# Patient Record
Sex: Male | Born: 1940
Health system: Southern US, Community
[De-identification: ages and names within clinical notes are randomized; demographics above are authoritative.]

## PROBLEM LIST (undated history)

## (undated) DIAGNOSIS — I499 Cardiac arrhythmia, unspecified: Secondary | ICD-10-CM

## (undated) DIAGNOSIS — E785 Hyperlipidemia, unspecified: Secondary | ICD-10-CM

## (undated) DIAGNOSIS — D759 Disease of blood and blood-forming organs, unspecified: Secondary | ICD-10-CM

## (undated) DIAGNOSIS — I509 Heart failure, unspecified: Secondary | ICD-10-CM

## (undated) DIAGNOSIS — I2510 Atherosclerotic heart disease of native coronary artery without angina pectoris: Secondary | ICD-10-CM

## (undated) DIAGNOSIS — M1990 Unspecified osteoarthritis, unspecified site: Secondary | ICD-10-CM

## (undated) DIAGNOSIS — I480 Paroxysmal atrial fibrillation: Secondary | ICD-10-CM

## (undated) DIAGNOSIS — I10 Essential (primary) hypertension: Secondary | ICD-10-CM

## (undated) DIAGNOSIS — C4490 Unspecified malignant neoplasm of skin, unspecified: Secondary | ICD-10-CM

## (undated) DIAGNOSIS — H269 Unspecified cataract: Secondary | ICD-10-CM

## (undated) DIAGNOSIS — C61 Malignant neoplasm of prostate: Secondary | ICD-10-CM

## (undated) DIAGNOSIS — M5126 Other intervertebral disc displacement, lumbar region: Secondary | ICD-10-CM

## (undated) DIAGNOSIS — I700 Atherosclerosis of aorta: Secondary | ICD-10-CM

## (undated) DIAGNOSIS — I77810 Thoracic aortic ectasia: Secondary | ICD-10-CM

## (undated) HISTORY — DX: Other intervertebral disc displacement, lumbar region: M51.26

## (undated) HISTORY — DX: Unspecified malignant neoplasm of skin, unspecified: C44.90

## (undated) HISTORY — DX: Malignant neoplasm of prostate: C61

## (undated) HISTORY — PX: COLONOSCOPY: SHX174

## (undated) HISTORY — PX: OTHER SURGICAL HISTORY: SHX169

## (undated) HISTORY — PX: BACK SURGERY: SHX140

## (undated) HISTORY — DX: Unspecified osteoarthritis, unspecified site: M19.90

## (undated) HISTORY — DX: Hyperlipidemia, unspecified: E78.5

## (undated) HISTORY — DX: Essential (primary) hypertension: I10

## (undated) HISTORY — DX: Unspecified cataract: H26.9

## (undated) HISTORY — PX: POLYPECTOMY: SHX149

---

## 1999-04-11 ENCOUNTER — Ambulatory Visit (HOSPITAL_COMMUNITY): Admission: RE | Admit: 1999-04-11 | Discharge: 1999-04-11 | Payer: Self-pay | Admitting: Podiatry

## 1999-07-15 ENCOUNTER — Encounter (INDEPENDENT_AMBULATORY_CARE_PROVIDER_SITE_OTHER): Payer: Self-pay | Admitting: Specialist

## 1999-07-15 ENCOUNTER — Ambulatory Visit (HOSPITAL_COMMUNITY): Admission: RE | Admit: 1999-07-15 | Discharge: 1999-07-15 | Payer: Self-pay | Admitting: Gastroenterology

## 2006-08-03 ENCOUNTER — Ambulatory Visit: Payer: Self-pay | Admitting: Gastroenterology

## 2006-08-10 ENCOUNTER — Ambulatory Visit: Payer: Self-pay | Admitting: Gastroenterology

## 2009-08-16 ENCOUNTER — Encounter: Admission: RE | Admit: 2009-08-16 | Discharge: 2009-08-16 | Payer: Self-pay | Admitting: Internal Medicine

## 2009-11-17 ENCOUNTER — Ambulatory Visit: Admission: RE | Admit: 2009-11-17 | Discharge: 2010-02-15 | Payer: Self-pay | Admitting: Radiation Oncology

## 2009-12-02 ENCOUNTER — Encounter: Admission: RE | Admit: 2009-12-02 | Discharge: 2009-12-02 | Payer: Self-pay | Admitting: Urology

## 2010-01-17 ENCOUNTER — Ambulatory Visit (HOSPITAL_BASED_OUTPATIENT_CLINIC_OR_DEPARTMENT_OTHER): Admission: RE | Admit: 2010-01-17 | Discharge: 2010-01-17 | Payer: Self-pay | Admitting: Urology

## 2010-02-18 ENCOUNTER — Encounter: Admission: RE | Admit: 2010-02-18 | Discharge: 2010-02-18 | Payer: Self-pay | Admitting: Internal Medicine

## 2010-08-25 ENCOUNTER — Encounter
Admission: RE | Admit: 2010-08-25 | Discharge: 2010-08-25 | Payer: Self-pay | Source: Home / Self Care | Attending: Internal Medicine | Admitting: Internal Medicine

## 2010-11-14 LAB — COMPREHENSIVE METABOLIC PANEL
ALT: 19 U/L (ref 0–53)
Albumin: 3.8 g/dL (ref 3.5–5.2)
Alkaline Phosphatase: 58 U/L (ref 39–117)
Chloride: 104 mEq/L (ref 96–112)
Creatinine, Ser: 1.06 mg/dL (ref 0.4–1.5)
GFR calc Af Amer: >60 mL/min (ref >60)
Total Bilirubin: 0.7 mg/dL (ref 0.3–1.2)

## 2010-11-14 LAB — PROTIME-INR: INR: 0.97 (ref 0.00–1.49)

## 2010-11-14 LAB — CBC
HCT: 47.7 % (ref 39.0–52.0)
Hemoglobin: 16.2 g/dL (ref 13.0–17.0)
MCHC: 33.9 g/dL (ref 30.0–36.0)
MCV: 95.8 fL (ref 78.0–100.0)
Platelets: 159 10*3/uL (ref 150–400)
RBC: 4.98 MIL/uL (ref 4.22–5.81)

## 2010-11-14 LAB — APTT: aPTT: 30 seconds (ref 24–37)

## 2011-08-28 ENCOUNTER — Other Ambulatory Visit: Payer: Self-pay | Admitting: Internal Medicine

## 2011-08-28 DIAGNOSIS — R9389 Abnormal findings on diagnostic imaging of other specified body structures: Secondary | ICD-10-CM

## 2011-09-01 ENCOUNTER — Ambulatory Visit
Admission: RE | Admit: 2011-09-01 | Discharge: 2011-09-01 | Disposition: A | Discharge status: Home / Self Care | Payer: Medicare Other | Source: Ambulatory Visit | Attending: Internal Medicine | Admitting: Internal Medicine

## 2011-09-01 DIAGNOSIS — R9389 Abnormal findings on diagnostic imaging of other specified body structures: Secondary | ICD-10-CM

## 2011-09-01 MED ORDER — IOHEXOL 300 MG/ML  SOLN
75.0000 mL | Freq: Once | INTRAMUSCULAR | Status: AC | PRN
  Administered 2011-09-01: 75 mL via INTRAVENOUS

## 2011-09-11 ENCOUNTER — Ambulatory Visit (HOSPITAL_COMMUNITY)
Admission: RE | Admit: 2011-09-11 | Discharge: 2011-09-11 | Disposition: A | Discharge status: Home / Self Care | Payer: Medicare Other | Source: Ambulatory Visit | Attending: Internal Medicine | Admitting: Internal Medicine

## 2011-09-11 DIAGNOSIS — R079 Chest pain, unspecified: Secondary | ICD-10-CM | POA: Insufficient documentation

## 2012-11-06 ENCOUNTER — Other Ambulatory Visit: Payer: Self-pay | Admitting: Internal Medicine

## 2012-11-06 DIAGNOSIS — I712 Thoracic aortic aneurysm, without rupture, unspecified: Secondary | ICD-10-CM

## 2012-11-08 ENCOUNTER — Ambulatory Visit
Admission: RE | Admit: 2012-11-08 | Discharge: 2012-11-08 | Disposition: A | Discharge status: Home / Self Care | Payer: Medicare Other | Source: Ambulatory Visit | Attending: Internal Medicine | Admitting: Internal Medicine

## 2012-11-08 DIAGNOSIS — I712 Thoracic aortic aneurysm, without rupture, unspecified: Secondary | ICD-10-CM

## 2012-11-08 MED ORDER — IOHEXOL 300 MG/ML  SOLN
100.0000 mL | Freq: Once | INTRAMUSCULAR | Status: AC | PRN
  Administered 2012-11-08: 100 mL via INTRAVENOUS

## 2013-10-08 ENCOUNTER — Other Ambulatory Visit: Payer: Self-pay | Admitting: Internal Medicine

## 2013-10-08 DIAGNOSIS — N6320 Unspecified lump in the left breast, unspecified quadrant: Secondary | ICD-10-CM

## 2013-10-17 ENCOUNTER — Ambulatory Visit
Admission: RE | Admit: 2013-10-17 | Discharge: 2013-10-17 | Disposition: A | Discharge status: Home / Self Care | Payer: Commercial Managed Care - HMO | Source: Ambulatory Visit | Attending: Internal Medicine | Admitting: Internal Medicine

## 2013-10-17 DIAGNOSIS — N6320 Unspecified lump in the left breast, unspecified quadrant: Secondary | ICD-10-CM

## 2014-03-06 ENCOUNTER — Other Ambulatory Visit: Payer: Self-pay | Admitting: Internal Medicine

## 2014-03-06 DIAGNOSIS — I714 Abdominal aortic aneurysm, without rupture, unspecified: Secondary | ICD-10-CM

## 2014-03-12 ENCOUNTER — Other Ambulatory Visit: Payer: Self-pay | Admitting: Internal Medicine

## 2014-03-12 ENCOUNTER — Ambulatory Visit
Admission: RE | Admit: 2014-03-12 | Discharge: 2014-03-12 | Disposition: A | Discharge status: Home / Self Care | Payer: Commercial Managed Care - HMO | Source: Ambulatory Visit | Attending: Internal Medicine | Admitting: Internal Medicine

## 2014-03-12 ENCOUNTER — Encounter (INDEPENDENT_AMBULATORY_CARE_PROVIDER_SITE_OTHER): Payer: Self-pay

## 2014-03-12 DIAGNOSIS — I712 Thoracic aortic aneurysm, without rupture, unspecified: Secondary | ICD-10-CM

## 2014-03-12 DIAGNOSIS — I714 Abdominal aortic aneurysm, without rupture, unspecified: Secondary | ICD-10-CM

## 2014-03-12 MED ORDER — IOHEXOL 300 MG/ML  SOLN
75.0000 mL | Freq: Once | INTRAMUSCULAR | Status: AC | PRN
  Administered 2014-03-12: 75 mL via INTRAVENOUS

## 2014-03-20 ENCOUNTER — Other Ambulatory Visit: Payer: Self-pay | Admitting: Internal Medicine

## 2014-03-20 DIAGNOSIS — E042 Nontoxic multinodular goiter: Secondary | ICD-10-CM

## 2014-03-24 ENCOUNTER — Ambulatory Visit
Admission: RE | Admit: 2014-03-24 | Discharge: 2014-03-24 | Disposition: A | Discharge status: Home / Self Care | Payer: Commercial Managed Care - HMO | Source: Ambulatory Visit | Attending: Internal Medicine | Admitting: Internal Medicine

## 2014-03-24 DIAGNOSIS — E042 Nontoxic multinodular goiter: Secondary | ICD-10-CM

## 2014-04-07 ENCOUNTER — Other Ambulatory Visit: Payer: Self-pay | Admitting: Internal Medicine

## 2014-04-07 DIAGNOSIS — E041 Nontoxic single thyroid nodule: Secondary | ICD-10-CM

## 2014-04-15 ENCOUNTER — Other Ambulatory Visit (HOSPITAL_COMMUNITY)
Admission: RE | Admit: 2014-04-15 | Discharge: 2014-04-15 | Disposition: A | Discharge status: Home / Self Care | Payer: Medicare HMO | Source: Ambulatory Visit | Attending: Interventional Radiology | Admitting: Interventional Radiology

## 2014-04-15 ENCOUNTER — Ambulatory Visit
Admission: RE | Admit: 2014-04-15 | Discharge: 2014-04-15 | Disposition: A | Discharge status: Home / Self Care | Payer: Commercial Managed Care - HMO | Source: Ambulatory Visit | Attending: Internal Medicine | Admitting: Internal Medicine

## 2014-04-15 DIAGNOSIS — E041 Nontoxic single thyroid nodule: Secondary | ICD-10-CM | POA: Insufficient documentation

## 2015-09-01 DIAGNOSIS — L570 Actinic keratosis: Secondary | ICD-10-CM | POA: Diagnosis not present

## 2015-09-01 DIAGNOSIS — X32XXXD Exposure to sunlight, subsequent encounter: Secondary | ICD-10-CM | POA: Diagnosis not present

## 2015-09-16 ENCOUNTER — Other Ambulatory Visit: Payer: Self-pay | Admitting: Internal Medicine

## 2015-09-16 DIAGNOSIS — I719 Aortic aneurysm of unspecified site, without rupture: Secondary | ICD-10-CM

## 2015-09-20 ENCOUNTER — Ambulatory Visit
Admission: RE | Admit: 2015-09-20 | Discharge: 2015-09-20 | Disposition: A | Discharge status: Home / Self Care | Payer: PPO | Source: Ambulatory Visit | Attending: Internal Medicine | Admitting: Internal Medicine

## 2015-09-20 DIAGNOSIS — I712 Thoracic aortic aneurysm, without rupture: Secondary | ICD-10-CM | POA: Diagnosis not present

## 2015-09-20 DIAGNOSIS — I719 Aortic aneurysm of unspecified site, without rupture: Secondary | ICD-10-CM

## 2015-09-20 MED ORDER — IOPAMIDOL (ISOVUE-300) INJECTION 61%
75.0000 mL | Freq: Once | INTRAVENOUS | Status: AC | PRN
  Administered 2015-09-20: 75 mL via INTRAVENOUS

## 2015-12-03 DIAGNOSIS — H353132 Nonexudative age-related macular degeneration, bilateral, intermediate dry stage: Secondary | ICD-10-CM | POA: Diagnosis not present

## 2016-01-02 DIAGNOSIS — H353132 Nonexudative age-related macular degeneration, bilateral, intermediate dry stage: Secondary | ICD-10-CM | POA: Diagnosis not present

## 2016-03-14 DIAGNOSIS — I10 Essential (primary) hypertension: Secondary | ICD-10-CM | POA: Diagnosis not present

## 2016-04-04 DIAGNOSIS — T1490 Injury, unspecified: Secondary | ICD-10-CM | POA: Diagnosis not present

## 2016-04-04 DIAGNOSIS — L570 Actinic keratosis: Secondary | ICD-10-CM | POA: Diagnosis not present

## 2016-04-04 DIAGNOSIS — X32XXXD Exposure to sunlight, subsequent encounter: Secondary | ICD-10-CM | POA: Diagnosis not present

## 2016-05-31 DIAGNOSIS — H01022 Squamous blepharitis right lower eyelid: Secondary | ICD-10-CM | POA: Diagnosis not present

## 2016-05-31 DIAGNOSIS — H353122 Nonexudative age-related macular degeneration, left eye, intermediate dry stage: Secondary | ICD-10-CM | POA: Diagnosis not present

## 2016-05-31 DIAGNOSIS — H353111 Nonexudative age-related macular degeneration, right eye, early dry stage: Secondary | ICD-10-CM | POA: Diagnosis not present

## 2016-05-31 DIAGNOSIS — H04123 Dry eye syndrome of bilateral lacrimal glands: Secondary | ICD-10-CM | POA: Diagnosis not present

## 2016-06-07 DIAGNOSIS — Z23 Encounter for immunization: Secondary | ICD-10-CM | POA: Diagnosis not present

## 2016-08-31 DIAGNOSIS — H01022 Squamous blepharitis right lower eyelid: Secondary | ICD-10-CM | POA: Diagnosis not present

## 2016-08-31 DIAGNOSIS — H04123 Dry eye syndrome of bilateral lacrimal glands: Secondary | ICD-10-CM | POA: Diagnosis not present

## 2016-08-31 DIAGNOSIS — H01025 Squamous blepharitis left lower eyelid: Secondary | ICD-10-CM | POA: Diagnosis not present

## 2016-09-12 ENCOUNTER — Encounter: Payer: Self-pay | Admitting: Internal Medicine

## 2016-09-12 ENCOUNTER — Other Ambulatory Visit: Payer: Self-pay | Admitting: Internal Medicine

## 2016-09-12 ENCOUNTER — Other Ambulatory Visit: Payer: Self-pay | Admitting: Diagnostic Radiology

## 2016-09-12 DIAGNOSIS — I712 Thoracic aortic aneurysm, without rupture, unspecified: Secondary | ICD-10-CM

## 2016-09-12 DIAGNOSIS — Z125 Encounter for screening for malignant neoplasm of prostate: Secondary | ICD-10-CM | POA: Diagnosis not present

## 2016-09-12 DIAGNOSIS — D075 Carcinoma in situ of prostate: Secondary | ICD-10-CM | POA: Diagnosis not present

## 2016-09-12 DIAGNOSIS — I10 Essential (primary) hypertension: Secondary | ICD-10-CM | POA: Diagnosis not present

## 2016-09-12 DIAGNOSIS — K51419 Inflammatory polyps of colon with unspecified complications: Secondary | ICD-10-CM | POA: Diagnosis not present

## 2016-09-12 DIAGNOSIS — Z0000 Encounter for general adult medical examination without abnormal findings: Secondary | ICD-10-CM | POA: Diagnosis not present

## 2016-09-19 ENCOUNTER — Ambulatory Visit
Admission: RE | Admit: 2016-09-19 | Discharge: 2016-09-19 | Disposition: A | Discharge status: Home / Self Care | Payer: PPO | Source: Ambulatory Visit | Attending: Internal Medicine | Admitting: Internal Medicine

## 2016-09-19 DIAGNOSIS — I712 Thoracic aortic aneurysm, without rupture, unspecified: Secondary | ICD-10-CM

## 2016-09-19 MED ORDER — IOPAMIDOL (ISOVUE-370) INJECTION 76%
75.0000 mL | Freq: Once | INTRAVENOUS | Status: AC | PRN
  Administered 2016-09-19: 75 mL via INTRAVENOUS

## 2016-10-03 DIAGNOSIS — L821 Other seborrheic keratosis: Secondary | ICD-10-CM | POA: Diagnosis not present

## 2016-10-03 DIAGNOSIS — D225 Melanocytic nevi of trunk: Secondary | ICD-10-CM | POA: Diagnosis not present

## 2016-10-03 DIAGNOSIS — X32XXXD Exposure to sunlight, subsequent encounter: Secondary | ICD-10-CM | POA: Diagnosis not present

## 2016-10-03 DIAGNOSIS — L570 Actinic keratosis: Secondary | ICD-10-CM | POA: Diagnosis not present

## 2016-10-09 HISTORY — PX: COLONOSCOPY: SHX174

## 2016-10-23 ENCOUNTER — Ambulatory Visit (AMBULATORY_SURGERY_CENTER): Payer: Self-pay

## 2016-10-23 ENCOUNTER — Encounter: Payer: Self-pay | Admitting: Internal Medicine

## 2016-10-23 VITALS — Ht 69.0 in | Wt 217.0 lb

## 2016-10-23 DIAGNOSIS — Z86010 Personal history of colonic polyps: Secondary | ICD-10-CM

## 2016-10-23 MED ORDER — NA SULFATE-K SULFATE-MG SULF 17.5-3.13-1.6 GM/177ML PO SOLN: 1.0000 | Freq: Once | ORAL | 0 refills | Status: AC

## 2016-10-23 NOTE — Progress Notes (Signed)
Patient denies allergies to eggs or soy. Patient denies home 02. No diet pills. No past problems with anesthesia.   Declines emmi.

## 2016-11-06 ENCOUNTER — Encounter: Payer: Self-pay | Admitting: Internal Medicine

## 2016-11-06 ENCOUNTER — Ambulatory Visit (AMBULATORY_SURGERY_CENTER): Payer: PPO | Admitting: Internal Medicine

## 2016-11-06 VITALS — BP 152/74 | HR 54 | Temp 97.1°F | Resp 15 | Ht 69.0 in | Wt 217.0 lb

## 2016-11-06 DIAGNOSIS — D123 Benign neoplasm of transverse colon: Secondary | ICD-10-CM

## 2016-11-06 DIAGNOSIS — Z86010 Personal history of colonic polyps: Secondary | ICD-10-CM | POA: Diagnosis not present

## 2016-11-06 DIAGNOSIS — Z1211 Encounter for screening for malignant neoplasm of colon: Secondary | ICD-10-CM | POA: Diagnosis not present

## 2016-11-06 MED ORDER — SODIUM CHLORIDE 0.9 % IV SOLN: 500.0000 mL | INTRAVENOUS | Status: DC

## 2016-11-06 NOTE — Progress Notes (Signed)
Called to room to assist during endoscopic procedure.  Patient ID and intended procedure confirmed with present staff. Received instructions for my participation in the procedure from the performing physician.  

## 2016-11-06 NOTE — Progress Notes (Signed)
Pt's states no medical or surgical changes since previsit or office visit. 

## 2016-11-06 NOTE — Patient Instructions (Signed)
YOU HAD AN ENDOSCOPIC PROCEDURE TODAY AT [facility]:   Refer to the procedure report that was given to you for any specific questions about what was found during the examination.  If the procedure report does not answer your questions, please call your gastroenterologist to clarify.  If you requested that your care partner not be given the details of your procedure findings, then the procedure report has been included in a sealed envelope for you to review at your convenience later.  YOU SHOULD EXPECT: Some feelings of bloating in the abdomen. Passage of more gas than usual.  Walking can help get rid of the air that was put into your GI tract during the procedure and reduce the bloating. If you had a lower endoscopy (such as a colonoscopy or flexible sigmoidoscopy) you may notice spotting of blood in your stool or on the toilet paper. If you underwent a bowel prep for your procedure, you may not have a normal bowel movement for a few days.  Please Note:  You might notice some irritation and congestion in your nose or some drainage.  This is from the oxygen used during your procedure.  There is no need for concern and it should clear up in a day or so.  SYMPTOMS TO REPORT IMMEDIATELY:   Following lower endoscopy (colonoscopy or flexible sigmoidoscopy):  Excessive amounts of blood in the stool  Significant tenderness or worsening of abdominal pains  Swelling of the abdomen that is new, acute  Fever of 100F or higher   For urgent or emergent issues, a gastroenterologist can be reached at any hour by calling [phone].   DIET:  We do recommend a small meal at first, but then you may proceed to your regular diet.  Drink plenty of fluids but you should avoid alcoholic beverages for 24 hours.  ACTIVITY:  You should plan to take it easy for the rest of today and you should NOT DRIVE or use heavy machinery until tomorrow (because of the sedation medicines used during the test).     FOLLOW UP: Our staff will call the number listed on your records the next business day following your procedure to check on you and address any questions or concerns that you may have regarding the information given to you following your procedure. If we do not reach you, we will leave a message.  However, if you are feeling well and you are not experiencing any problems, there is no need to return our call.  We will assume that you have returned to your regular daily activities without incident.  If any biopsies were taken you will be contacted by phone or by letter within the next 1-3 weeks.  Please call us at [phone] if you have not heard about the biopsies in 3 weeks.  Polyps (handout given) Diverticulosis (handout given) Hemorrhoids (handout given) Await for biopsy results to determined next repeat Colonoscopy High Fiber Diet (handout given)    SIGNATURES/CONFIDENTIALITY: You and/or your care partner have signed paperwork which will be entered into your electronic medical record.  These signatures attest to the fact that that the information above on your After Visit Summary has been reviewed and is understood.  Full responsibility of the confidentiality of this discharge information lies with you and/or your care-partner.

## 2016-11-06 NOTE — Progress Notes (Signed)
TO PACU VSS Report to RN

## 2016-11-06 NOTE — Op Note (Signed)
[name]: Kevin Day Procedure Date: 11/06/2016 8:33 AM MRN: [id number] Endoscopist: Jerene Bears , MD Age: 76 Referring MD:  Date of Birth: 02/22/1941 Gender: Male Account #: [id number] Procedure:                Colonoscopy Indications:              High risk colon cancer surveillance: Personal                            history of colonic polyps, Last colonoscopy: 2007                            (no polyps) Medicines:                Monitored Anesthesia Care Procedure:                Pre-Anesthesia Assessment:                           - Prior to the procedure, a History and Physical                            was performed, and patient medications and                            allergies were reviewed. The patient's tolerance of                            previous anesthesia was also reviewed. The risks                            and benefits of the procedure and the sedation                            options and risks were discussed with the patient.                            All questions were answered, and informed consent                            was obtained. Prior Anticoagulants: The patient has                            taken no previous anticoagulant or antiplatelet                            agents. ASA Grade Assessment: II - A patient with                            mild systemic disease. After reviewing the risks                            and benefits, the patient was deemed in  satisfactory condition to undergo the procedure.                           After obtaining informed consent, the colonoscope                            was passed under direct vision. Throughout the                            procedure, the patient's blood pressure, pulse, and                            oxygen saturations were monitored continuously. The                            Colonoscope was introduced through the anus and                    advanced to the the cecum, identified by                            appendiceal orifice and ileocecal valve. The                            colonoscopy was performed without difficulty. The                            patient tolerated the procedure well. The quality                            of the bowel preparation was good. The ileocecal                            valve, appendiceal orifice, and rectum were                            photographed. Scope In: 8:46:45 AM Scope Out: 9:02:17 AM Scope Withdrawal Time: 0 hours 13 minutes 35 seconds  Total Procedure Duration: 0 hours 15 minutes 32 seconds  Findings:                 The perianal and digital rectal examinations were                            normal.                           Three sessile polyps were found in the transverse                            colon. The polyps were 4 to 5 mm in size. These                            polyps were removed with a cold snare. Resection  and retrieval were complete.                           Multiple small and large-mouthed diverticula were                            found from hepatic flexure to sigmoid colon.                           Internal hemorrhoids were found during                            retroflexion. The hemorrhoids were small. Complications:            No immediate complications. Estimated Blood Loss:     Estimated blood loss was minimal. Impression:               - Three 4 to 5 mm polyps in the transverse colon,                            removed with a cold snare. Resected and retrieved.                           - Moderate diverticulosis from hepatic flexure to                            sigmoid colon.                           - Internal hemorrhoids. Recommendation:           - Patient has a contact number available for                            emergencies. The signs and symptoms of potential                            delayed  complications were discussed with the                            patient. Return to normal activities tomorrow.                            Written discharge instructions were provided to the                            patient.                           - Resume previous diet.                           - Continue present medications.                           - Await pathology results.                           -  Repeat colonoscopy is recommended. The                            colonoscopy date will be determined after pathology                            results from today's exam become available for                            review. Jerene Bears, MD 11/06/2016 9:05:37 AM This report has been signed electronically.

## 2016-11-07 ENCOUNTER — Telehealth: Payer: Self-pay

## 2016-11-07 NOTE — Telephone Encounter (Signed)
  Follow up Call-  Call back number 11/06/2016  Post procedure Call Back phone  # [phone]  Permission to leave phone message Yes  Some recent data might be hidden     Patient questions:  Do you have a fever, pain , or abdominal swelling? No. Pain Score  0 *  Have you tolerated food without any problems? Yes.    Have you been able to return to your normal activities? Yes.    Do you have any questions about your discharge instructions: Diet   No. Medications  No. Follow up visit  No.  Do you have questions or concerns about your Care? No.  Actions: * If pain score is 4 or above: No action needed, pain <4.

## 2016-11-09 ENCOUNTER — Encounter: Payer: Self-pay | Admitting: Internal Medicine

## 2016-11-30 DIAGNOSIS — H04123 Dry eye syndrome of bilateral lacrimal glands: Secondary | ICD-10-CM | POA: Diagnosis not present

## 2016-11-30 DIAGNOSIS — H353111 Nonexudative age-related macular degeneration, right eye, early dry stage: Secondary | ICD-10-CM | POA: Diagnosis not present

## 2016-11-30 DIAGNOSIS — H353122 Nonexudative age-related macular degeneration, left eye, intermediate dry stage: Secondary | ICD-10-CM | POA: Diagnosis not present

## 2016-11-30 DIAGNOSIS — H01025 Squamous blepharitis left lower eyelid: Secondary | ICD-10-CM | POA: Diagnosis not present

## 2016-11-30 DIAGNOSIS — H01022 Squamous blepharitis right lower eyelid: Secondary | ICD-10-CM | POA: Diagnosis not present

## 2017-01-15 DIAGNOSIS — H903 Sensorineural hearing loss, bilateral: Secondary | ICD-10-CM | POA: Diagnosis not present

## 2017-01-16 DIAGNOSIS — X32XXXD Exposure to sunlight, subsequent encounter: Secondary | ICD-10-CM | POA: Diagnosis not present

## 2017-01-16 DIAGNOSIS — L570 Actinic keratosis: Secondary | ICD-10-CM | POA: Diagnosis not present

## 2017-02-05 DIAGNOSIS — G8929 Other chronic pain: Secondary | ICD-10-CM | POA: Diagnosis not present

## 2017-02-05 DIAGNOSIS — M25561 Pain in right knee: Secondary | ICD-10-CM | POA: Diagnosis not present

## 2017-02-05 DIAGNOSIS — M170 Bilateral primary osteoarthritis of knee: Secondary | ICD-10-CM | POA: Diagnosis not present

## 2017-02-05 DIAGNOSIS — M25562 Pain in left knee: Secondary | ICD-10-CM | POA: Diagnosis not present

## 2017-02-14 DIAGNOSIS — M170 Bilateral primary osteoarthritis of knee: Secondary | ICD-10-CM | POA: Diagnosis not present

## 2017-03-04 DIAGNOSIS — J069 Acute upper respiratory infection, unspecified: Secondary | ICD-10-CM | POA: Diagnosis not present

## 2017-03-21 DIAGNOSIS — Z8546 Personal history of malignant neoplasm of prostate: Secondary | ICD-10-CM | POA: Diagnosis not present

## 2017-03-21 DIAGNOSIS — I10 Essential (primary) hypertension: Secondary | ICD-10-CM | POA: Diagnosis not present

## 2017-03-21 DIAGNOSIS — K635 Polyp of colon: Secondary | ICD-10-CM | POA: Diagnosis not present

## 2017-05-23 DIAGNOSIS — C44319 Basal cell carcinoma of skin of other parts of face: Secondary | ICD-10-CM | POA: Diagnosis not present

## 2017-05-23 DIAGNOSIS — D225 Melanocytic nevi of trunk: Secondary | ICD-10-CM | POA: Diagnosis not present

## 2017-05-23 DIAGNOSIS — X32XXXD Exposure to sunlight, subsequent encounter: Secondary | ICD-10-CM | POA: Diagnosis not present

## 2017-05-23 DIAGNOSIS — L308 Other specified dermatitis: Secondary | ICD-10-CM | POA: Diagnosis not present

## 2017-05-23 DIAGNOSIS — L570 Actinic keratosis: Secondary | ICD-10-CM | POA: Diagnosis not present

## 2017-06-06 DIAGNOSIS — H353122 Nonexudative age-related macular degeneration, left eye, intermediate dry stage: Secondary | ICD-10-CM | POA: Diagnosis not present

## 2017-06-06 DIAGNOSIS — H04123 Dry eye syndrome of bilateral lacrimal glands: Secondary | ICD-10-CM | POA: Diagnosis not present

## 2017-06-06 DIAGNOSIS — H353111 Nonexudative age-related macular degeneration, right eye, early dry stage: Secondary | ICD-10-CM | POA: Diagnosis not present

## 2017-06-26 DIAGNOSIS — C44319 Basal cell carcinoma of skin of other parts of face: Secondary | ICD-10-CM | POA: Diagnosis not present

## 2017-06-26 DIAGNOSIS — C44622 Squamous cell carcinoma of skin of right upper limb, including shoulder: Secondary | ICD-10-CM | POA: Diagnosis not present

## 2017-06-26 DIAGNOSIS — L570 Actinic keratosis: Secondary | ICD-10-CM | POA: Diagnosis not present

## 2017-06-26 DIAGNOSIS — X32XXXD Exposure to sunlight, subsequent encounter: Secondary | ICD-10-CM | POA: Diagnosis not present

## 2017-07-24 DIAGNOSIS — L570 Actinic keratosis: Secondary | ICD-10-CM | POA: Diagnosis not present

## 2017-07-24 DIAGNOSIS — C44319 Basal cell carcinoma of skin of other parts of face: Secondary | ICD-10-CM | POA: Diagnosis not present

## 2017-07-24 DIAGNOSIS — Z85828 Personal history of other malignant neoplasm of skin: Secondary | ICD-10-CM | POA: Diagnosis not present

## 2017-07-24 DIAGNOSIS — Z08 Encounter for follow-up examination after completed treatment for malignant neoplasm: Secondary | ICD-10-CM | POA: Diagnosis not present

## 2017-07-24 DIAGNOSIS — L255 Unspecified contact dermatitis due to plants, except food: Secondary | ICD-10-CM | POA: Diagnosis not present

## 2017-07-24 DIAGNOSIS — X32XXXD Exposure to sunlight, subsequent encounter: Secondary | ICD-10-CM | POA: Diagnosis not present

## 2017-08-09 DIAGNOSIS — J40 Bronchitis, not specified as acute or chronic: Secondary | ICD-10-CM | POA: Diagnosis not present

## 2017-09-04 DIAGNOSIS — Z85828 Personal history of other malignant neoplasm of skin: Secondary | ICD-10-CM | POA: Diagnosis not present

## 2017-09-04 DIAGNOSIS — C44319 Basal cell carcinoma of skin of other parts of face: Secondary | ICD-10-CM | POA: Diagnosis not present

## 2017-09-04 DIAGNOSIS — L570 Actinic keratosis: Secondary | ICD-10-CM | POA: Diagnosis not present

## 2017-09-04 DIAGNOSIS — Z08 Encounter for follow-up examination after completed treatment for malignant neoplasm: Secondary | ICD-10-CM | POA: Diagnosis not present

## 2017-09-04 DIAGNOSIS — X32XXXD Exposure to sunlight, subsequent encounter: Secondary | ICD-10-CM | POA: Diagnosis not present

## 2017-09-28 ENCOUNTER — Telehealth: Payer: Self-pay | Admitting: Internal Medicine

## 2017-09-28 DIAGNOSIS — Z1211 Encounter for screening for malignant neoplasm of colon: Secondary | ICD-10-CM | POA: Diagnosis not present

## 2017-09-28 DIAGNOSIS — I719 Aortic aneurysm of unspecified site, without rupture: Secondary | ICD-10-CM | POA: Diagnosis not present

## 2017-09-28 DIAGNOSIS — C61 Malignant neoplasm of prostate: Secondary | ICD-10-CM | POA: Diagnosis not present

## 2017-09-28 DIAGNOSIS — I10 Essential (primary) hypertension: Secondary | ICD-10-CM | POA: Diagnosis not present

## 2017-09-28 DIAGNOSIS — C4490 Unspecified malignant neoplasm of skin, unspecified: Secondary | ICD-10-CM | POA: Diagnosis not present

## 2017-09-28 DIAGNOSIS — K922 Gastrointestinal hemorrhage, unspecified: Secondary | ICD-10-CM | POA: Diagnosis not present

## 2017-09-28 NOTE — Telephone Encounter (Signed)
Noted  

## 2017-09-28 NOTE — Telephone Encounter (Signed)
Ritter from Dr. Carlota Raspberry office calling in stating that patient has blood in stool and blood beyond anuscope. She states that patient is actively bleeding. Per Vaughan Basta, patient would need to be seen in ED. I informed Kerin Ransom of this and she says that Dr. Carlota Raspberry does not want to send patient to ED(doesn't want patient to have to go through that) and that he will see what hemoglobin results are first. Appointment scheduled for 10/05/17.

## 2017-10-05 ENCOUNTER — Encounter (INDEPENDENT_AMBULATORY_CARE_PROVIDER_SITE_OTHER): Payer: Self-pay

## 2017-10-05 ENCOUNTER — Other Ambulatory Visit: Payer: PPO

## 2017-10-05 ENCOUNTER — Encounter: Payer: Self-pay | Admitting: Physician Assistant

## 2017-10-05 ENCOUNTER — Ambulatory Visit: Payer: PPO | Admitting: Physician Assistant

## 2017-10-05 VITALS — BP 122/68 | HR 96 | Ht 72.0 in | Wt 215.4 lb

## 2017-10-05 DIAGNOSIS — R195 Other fecal abnormalities: Secondary | ICD-10-CM | POA: Diagnosis not present

## 2017-10-05 DIAGNOSIS — K625 Hemorrhage of anus and rectum: Secondary | ICD-10-CM | POA: Diagnosis not present

## 2017-10-05 LAB — CBC WITH DIFFERENTIAL/PLATELET
BASOS ABS: 46 {cells}/uL (ref 0–200)
BASOS PCT: 0.6 %
EOS ABS: 270 {cells}/uL (ref 15–500)
Eosinophils Relative: 3.5 %
HEMATOCRIT: 47.3 % (ref 38.5–50.0)
HEMOGLOBIN: 16.9 g/dL (ref 13.2–17.1)
LYMPHS ABS: 2218 {cells}/uL (ref 850–3900)
MCH: 32.3 pg (ref 27.0–33.0)
MCHC: 35.7 g/dL (ref 32.0–36.0)
MCV: 90.4 fL (ref 80.0–100.0)
MONOS PCT: 10.8 %
MPV: 10.5 fL (ref 7.5–12.5)
NEUTROS ABS: 4335 {cells}/uL (ref 1500–7800)
Neutrophils Relative %: 56.3 %
Platelets: 179 10*3/uL (ref 140–400)
RBC: 5.23 10*6/uL (ref 4.20–5.80)
RDW: 13.1 % (ref 11.0–15.0)
Total Lymphocyte: 28.8 %
WBC: 7.7 10*3/uL (ref 3.8–10.8)
WBCMIX: 832 {cells}/uL (ref 200–950)

## 2017-10-05 NOTE — Patient Instructions (Signed)
Your physician has requested that you go to the basement for  lab work before leaving today.   

## 2017-10-05 NOTE — Progress Notes (Addendum)
Chief Complaint: Hemoccult positive stool  HPI:    Kevin Day is a 76-year past medical history of prostate cancer and radiation, who was referred to me by Levin Erp, MD for a complaint of Hemoccult positive stool.      Patient follows with Dr. Hilarie Fredrickson and had his last colonoscopy 11/06/16 with a finding of 3 4-5 mm polyps in transverse colon, moderate diverticulosis from hepatic flexure to sigmoid colon and internal hemorrhoids.  Polyps were tubular adenomas.  Repeat was recommended in 3 years.    CBC completed 09/28/17 showed a hemoglobin of 17.3 and was otherwise normal.  I did speak with Dr. Nyoka Cowden at time of patient's appointment and he described his rectal exam.  He tells me that on digital exam he saw spots of what appeared to be clotted blood on his gloved finger, "like it was coming off of a scab", he did an anoscopy and did not see any active bleeding, he did place a Q-tip past his anoscope and still got some "spots of dried blood".    Today, the patient tells me that he has been fine.  He has not seen any bright red blood in his stool or black tarry sticky stools.  He was feeling well when he was at his annual exam but was told that he had blood in his rectum.  He tells me that Dr. Nyoka Cowden was so worried that if he could not have made him an appointment in our office soon would have sent him to the hospital.  Patient tells me he has been watching his stool even closer since time of seeing Dr. Nyoka Cowden and has noticed nothing.  Patient does use a daily baby aspirin for cardiac protection.  Patient denies any change in his bowel habits, rectal pain or other symptoms.    Patient denies fever, chills, melena, weight loss, anorexia, nausea, vomiting, heartburn, reflux or symptoms that awaken him at night.  Past Medical History:  Diagnosis Date  . Cataracts, bilateral   . Hypertension   . Prostate cancer ([place])   . Skin cancer     Past Surgical History:  Procedure Laterality Date  . cataract  Bilateral   . COLONOSCOPY    . POLYPECTOMY    . radioactive      radioactive seed implants for prostate CA.     Current Outpatient Medications  Medication Sig Dispense Refill  . aspirin EC 81 MG tablet Take 81 mg by mouth daily.    Marland Kitchen glucosamine-chondroitin 500-400 MG tablet Take 1 tablet by mouth every morning.    . Multiple Vitamins-Minerals (OCUVITE ADULT 50+ PO) Take 1 capsule by mouth daily.    . Omega-3 Fatty Acids (FISH OIL PO) Take by mouth.    . Omega-3 Fatty Acids (FISH OIL) 1000 MG CAPS Take 4 capsules by mouth every morning.    . RESTASIS MULTIDOSE 0.05 % ophthalmic emulsion INSTILL 1 DROP INTO EACH EYE Q 12 H  4  . triamterene-hydrochlorothiazide (DYAZIDE) 37.5-25 MG capsule Take 1 capsule by mouth daily.     Current Facility-Administered Medications  Medication Dose Route Frequency Provider Last Rate Last Dose  . 0.9 %  sodium chloride infusion  500 mL Intravenous Continuous Pyrtle, Lajuan Lines, MD        Allergies as of 10/05/2017  . (Not on File)    Family History  Problem Relation Age of Onset  . Breast cancer Sister   . Colon cancer Neg Hx   . Esophageal cancer Neg  Hx   . Rectal cancer Neg Hx   . Stomach cancer Neg Hx     Social History   Socioeconomic History  . Marital status: Married    Spouse name: Not on file  . Number of children: Not on file  . Years of education: Not on file  . Highest education level: Not on file  Social Needs  . Financial resource strain: Not on file  . Food insecurity - worry: Not on file  . Food insecurity - inability: Not on file  . Transportation needs - medical: Not on file  . Transportation needs - non-medical: Not on file  Occupational History  . Not on file  Tobacco Use  . Smoking status: Former Research scientist (life sciences)  . Smokeless tobacco: Never Used  Substance and Sexual Activity  . Alcohol use: No  . Drug use: No  . Sexual activity: Not on file  Other Topics Concern  . Not on file  Social History Narrative  . Not on file     Review of Systems:    Constitutional: No weight loss, fever or chills Cardiovascular: No chest pain Respiratory: No SOB  Gastrointestinal: See HPI and otherwise negative   Physical Exam:  Vital signs: BP 122/68   Pulse 96   Ht 6' (1.829 m)   Wt 215 lb 6.4 oz (97.7 kg)   BMI 29.21 kg/m   Constitutional:   Pleasant Caucasian male appears to be in NAD, Well developed, Well nourished, alert and cooperative Respiratory: Respirations even and unlabored. Lungs clear to auscultation bilaterally.   No wheezes, crackles, or rhonchi.  Cardiovascular: Normal S1, S2. No MRG. Regular rate and rhythm. No peripheral edema, cyanosis or pallor.  Gastrointestinal:  Soft, nondistended, nontender. No rebound or guarding. Normal bowel sounds. No appreciable masses or hepatomegaly. Rectal: External: 3 hemorrhoid tags; internal: No mass, no discharge; anoscopy: No signs of rectal bleeding Psychiatric: . Demonstrates good judgement and reason without abnormal affect or behaviors.  RELEVANT LABS AND IMAGING: CBC    Component Value Date/Time   WBC 6.5 01/10/2010 0940   RBC 4.98 01/10/2010 0940   HGB 16.2 01/10/2010 0940   HCT 47.7 01/10/2010 0940   PLT 159 01/10/2010 0940   MCV 95.8 01/10/2010 0940   MCHC 33.9 01/10/2010 0940   RDW 13.6 01/10/2010 0940    CMP     Component Value Date/Time   NA 138 01/10/2010 0940   K 3.9 01/10/2010 0940   CL 104 01/10/2010 0940   CO2 27 01/10/2010 0940   GLUCOSE 103 (H) 01/10/2010 0940   BUN 18 01/10/2010 0940   CREATININE 1.06 01/10/2010 0940   CALCIUM 8.9 01/10/2010 0940   PROT 7.3 01/10/2010 0940   ALBUMIN 3.8 01/10/2010 0940   AST 19 01/10/2010 0940   ALT 19 01/10/2010 0940   ALKPHOS 58 01/10/2010 0940   BILITOT 0.7 01/10/2010 0940   GFRNONAA >60 01/10/2010 0940   GFRAA  01/10/2010 0940    >60        The eGFR has been calculated using the MDRD equation. This calculation has not been validated in all clinical situations. eGFR's  persistently <60 mL/min signify possible Chronic Kidney Disease.    Assessment: 1.  Hemoccult positive testing: Per PCP 09/28/17 patient has annual exam, some flecks of dried blood on exam, no acute bleeding, history of prostate cancer and radiation, per discussion with Dr. Nyoka Cowden he is concerned regarding radiation proctitis, he suggests the patient have a sigmoidoscopy for further evaluation, no bleeding at  time of exam today  Plan: 1.  Discussed with patient that I do not see any acute bleeding at time of exam today.  His recent hemoglobin was 17.  I am not concerned regarding an acute GI bleed. 2.  Repeated CBC today. 3.  We will discuss with Dr. Hilarie Fredrickson if he feels as though patient may benefit from a flex sigmoidoscopy at this time per recommendations from Dr. Carlota Raspberry. 4.  Patient to follow in clinic per recommendations after labs above and discussion with Dr. Hilarie Fredrickson.  Ellouise Newer, PA-C [facility] 10/05/2017, 4:17 PM  Cc: Levin Erp, MD   Addendum: Reviewed and agree with management. Differential for rectal bleeding includes internal hemorrhoids, which were seen at the time of his last colonoscopy, 11 months ago. Radiation proctitis is also in the differential and can bleed intermittently.  He has a history of his prostate cancer and received external beam radiation. Flexible sigmoidoscopy could be performed to clarify bleeding source though hemoglobin being normal is very reassuring.  Hemoglobin was 16.9 when checked on 10/05/2017 Flexible sigmoidoscopy would determine whether this is internal hemorrhoids or radiation proctitis.  Treatment of radiation proctitis, sometimes involves APC ablation to destroy the abnormal blood vessels created by radiation.  Normally this is only done for persistent, visible bleeding, and when complicated by anemia.  APC ablation can be done at flexible sigmoidoscopy but requires hospital setting with full bowel preparation.  Given his lack  of overt/ongoing bleeding and lack of anemia, flexible sigmoidoscopy in the [facility] for diagnosis is reasonable and would likely be reassuring to both the patient, Dr. Nyoka Cowden and [place]. Would proceed if okay with patient Pyrtle, Lajuan Lines, MD

## 2017-10-09 ENCOUNTER — Telehealth: Payer: Self-pay

## 2017-10-09 NOTE — Telephone Encounter (Signed)
Levin Erp, [place]  Jeoffrey Massed, RN        Please call pt. Per Dr. Hilarie Fredrickson and Dr. Nyoka Cowden, they would like him to have flex sig in the [facility] for further eval if he is willing. Please read Dr. Vena Rua addendum to my note. Thank you-JLL

## 2017-10-10 NOTE — Telephone Encounter (Signed)
Dottie please see Dr Vena Rua recommendation, I need to set the pt up for Flex at [facility]  When is he available.  The pt is agreeable and I will call him back with the date and time.      Addendum: Reviewed and agree with management. Differential for rectal bleeding includes internal hemorrhoids, which were seen at the time of his last colonoscopy, 11 months ago. Radiation proctitis is also in the differential and can bleed intermittently.  He has a history of his prostate cancer and received external beam radiation. Flexible sigmoidoscopy could be performed to clarify bleeding source though hemoglobin being normal is very reassuring.  Hemoglobin was 16.9 when checked on 10/05/2017 Flexible sigmoidoscopy would determine whether this is internal hemorrhoids or radiation proctitis.  Treatment of radiation proctitis, sometimes involves APC ablation to destroy the abnormal blood vessels created by radiation.  Normally this is only done for persistent, visible bleeding, and when complicated by anemia.  APC ablation can be done at flexible sigmoidoscopy but requires hospital setting with full bowel preparation.  Given his lack of overt/ongoing bleeding and lack of anemia, flexible sigmoidoscopy in the [facility] for diagnosis is reasonable and would likely be reassuring to both the patient, Dr. Nyoka Cowden and [place]. Would proceed if okay with patient Pyrtle, Lajuan Lines, MD

## 2017-10-10 NOTE — Telephone Encounter (Signed)
The pt has been scheduled for 10/17/17 flex with pyrtle and previsit for tomorrow.  The pt has been advised

## 2017-10-11 ENCOUNTER — Other Ambulatory Visit: Payer: Self-pay

## 2017-10-11 ENCOUNTER — Ambulatory Visit (AMBULATORY_SURGERY_CENTER): Payer: Self-pay

## 2017-10-11 ENCOUNTER — Encounter: Payer: Self-pay | Admitting: Internal Medicine

## 2017-10-11 VITALS — Ht 69.0 in | Wt 216.0 lb

## 2017-10-11 DIAGNOSIS — R195 Other fecal abnormalities: Secondary | ICD-10-CM

## 2017-10-11 NOTE — Progress Notes (Signed)
Denies allergies to eggs or soy products. Denies complication of anesthesia or sedation. Denies use of weight loss medication. Denies use of O2.   Emmi instructions declined.  

## 2017-10-17 ENCOUNTER — Encounter: Payer: Self-pay | Admitting: Internal Medicine

## 2017-10-17 ENCOUNTER — Ambulatory Visit (AMBULATORY_SURGERY_CENTER): Payer: PPO | Admitting: Internal Medicine

## 2017-10-17 ENCOUNTER — Other Ambulatory Visit: Payer: Self-pay

## 2017-10-17 VITALS — BP 151/83 | HR 55 | Temp 97.8°F | Resp 19 | Ht 72.0 in | Wt 215.0 lb

## 2017-10-17 DIAGNOSIS — D125 Benign neoplasm of sigmoid colon: Secondary | ICD-10-CM

## 2017-10-17 DIAGNOSIS — I10 Essential (primary) hypertension: Secondary | ICD-10-CM | POA: Diagnosis not present

## 2017-10-17 DIAGNOSIS — R195 Other fecal abnormalities: Secondary | ICD-10-CM

## 2017-10-17 DIAGNOSIS — E669 Obesity, unspecified: Secondary | ICD-10-CM | POA: Diagnosis not present

## 2017-10-17 DIAGNOSIS — Z8673 Personal history of transient ischemic attack (TIA), and cerebral infarction without residual deficits: Secondary | ICD-10-CM | POA: Diagnosis not present

## 2017-10-17 DIAGNOSIS — D124 Benign neoplasm of descending colon: Secondary | ICD-10-CM

## 2017-10-17 MED ORDER — SODIUM CHLORIDE 0.9 % IV SOLN: 500.0000 mL | Freq: Once | INTRAVENOUS | Status: DC

## 2017-10-17 NOTE — Op Note (Signed)
[place] Patient Name: Kevin Day Procedure Date: 10/17/2017 3:15 PM MRN: [id number] Endoscopist: Jerene Bears , MD Age: 77 Referring MD:  Date of Birth: 06/13/1941 Gender: Male Account #: [id number] Procedure:                Flexible Sigmoidoscopy Indications:              abnormal DRE with at primary care visit (visible                            blood seen on examining finger) Medicines:                Monitored Anesthesia Care Procedure:                Pre-Anesthesia Assessment:                           - Prior to the procedure, a History and Physical                            was performed, and patient medications and                            allergies were reviewed. The patient's tolerance of                            previous anesthesia was also reviewed. The risks                            and benefits of the procedure and the sedation                            options and risks were discussed with the patient.                            All questions were answered, and informed consent                            was obtained. Prior Anticoagulants: The patient has                            taken no previous anticoagulant or antiplatelet                            agents. ASA Grade Assessment: III - A patient with                            severe systemic disease. After reviewing the risks                            and benefits, the patient was deemed in                            satisfactory condition to undergo the procedure.  After obtaining informed consent, the scope was                            passed under direct vision. The Colonoscope was                            introduced through the anus and advanced to the the                            descending colon. The flexible sigmoidoscopy was                            accomplished without difficulty. The patient                            tolerated the procedure well.  The quality of the                            bowel preparation was good. Scope In: Scope Out: Findings:                 The digital rectal exam was normal.                           A 4 mm polyp was found in the descending colon. The                            polyp was sessile. The polyp was removed with a                            cold snare. Resection and retrieval were complete.                           A 2 mm polyp was found in the sigmoid colon. The                            polyp was sessile. The polyp was removed with a                            cold snare. Resection and retrieval were complete.                           Multiple small and large-mouthed diverticula were                            found in the sigmoid colon.                           2 discrete areas of radiation proctitis versus                            angioectasia with typical arborization were found  in the distal rectum. These areas were not bleeding.                           Internal hemorrhoids were found during                            retroflexion. The hemorrhoids were small. Complications:            No immediate complications. Estimated Blood Loss:     Estimated blood loss was minimal. Impression:               - One 4 mm polyp in the descending colon, removed                            with a cold snare. Resected and retrieved.                           - One 2 mm polyp in the sigmoid colon, removed with                            a cold snare. Resected and retrieved.                           - Moderate diverticulosis in the sigmoid colon.                           - Mild radiation proctitis versus small                            angioectasias in the distal rectum. Very likely                            source of blood seen during recent digital rectal                            examination.                           - Small internal hemorrhoids. Recommendation:            - Patient has a contact number available for                            emergencies. The signs and symptoms of potential                            delayed complications were discussed with the                            patient. Return to normal activities tomorrow.                            Written discharge instructions were provided to the  patient.                           - Resume previous diet.                           - Await pathology result.                           - Continue present medications.                           - If visible bleeding occurs on a regular basis                            then APC ablation can be performed in the distal                            rectum.                           - Recall colonoscopy at previously                            stated/recommended interval. Jerene Bears, MD 10/17/2017 3:55:06 PM This report has been signed electronically.

## 2017-10-17 NOTE — Progress Notes (Signed)
Pt's states no medical or surgical changes since previsit or office visit. 

## 2017-10-17 NOTE — Patient Instructions (Signed)
**  Handouts given on polyps, Diverticulosis and hemorrhoids**   YOU HAD AN ENDOSCOPIC PROCEDURE TODAY: Refer to the procedure report and other information in the discharge instructions given to you for any specific questions about what was found during the examination. If this information does not answer your questions, please call [place] office at [phone] to clarify.   YOU SHOULD EXPECT: Some feelings of bloating in the abdomen. Passage of more gas than usual. Walking can help get rid of the air that was put into your GI tract during the procedure and reduce the bloating. If you had a lower endoscopy (such as a colonoscopy or flexible sigmoidoscopy) you may notice spotting of blood in your stool or on the toilet paper. Some abdominal soreness may be present for a day or two, also.  DIET: Your first meal following the procedure should be a light meal and then it is ok to progress to your normal diet. A half-sandwich or bowl of soup is an example of a good first meal. Heavy or fried foods are harder to digest and may make you feel nauseous or bloated. Drink plenty of fluids but you should avoid alcoholic beverages for 24 hours. If you had a esophageal dilation, please see attached instructions for diet.    ACTIVITY: Your care partner should take you home directly after the procedure. You should plan to take it easy, moving slowly for the rest of the day. You can resume normal activity the day after the procedure however YOU SHOULD NOT DRIVE, use power tools, machinery or perform tasks that involve climbing or major physical exertion for 24 hours (because of the sedation medicines used during the test).   SYMPTOMS TO REPORT IMMEDIATELY: A gastroenterologist can be reached at any hour. Please call [phone]  for any of the following symptoms:  Following lower endoscopy (colonoscopy, flexible sigmoidoscopy) Excessive amounts of blood in the stool  Significant tenderness, worsening of abdominal  pains  Swelling of the abdomen that is new, acute  Fever of 100 or higher  stools  FOLLOW UP:  If any biopsies were taken you will be contacted by phone or by letter within the next 1-3 weeks. Call [phone]  if you have not heard about the biopsies in 3 weeks.  Please also call with any specific questions about appointments or follow up tests.

## 2017-10-17 NOTE — Progress Notes (Signed)
Report to PACU, RN, vss, BBS= Clear.  

## 2017-10-17 NOTE — Progress Notes (Signed)
Called to room to assist during endoscopic procedure.  Patient ID and intended procedure confirmed with present staff. Received instructions for my participation in the procedure from the performing physician.  

## 2017-10-18 ENCOUNTER — Telehealth: Payer: Self-pay

## 2017-10-18 NOTE — Telephone Encounter (Signed)
  Follow up Call-  Call back number 10/17/2017 11/06/2016  Post procedure Call Back phone  # [phone]  Permission to leave phone message Yes Yes  Some recent data might be hidden     Patient questions:  Do you have a fever, pain , or abdominal swelling? No. Pain Score  0 *  Have you tolerated food without any problems? Yes.    Have you been able to return to your normal activities? Yes.    Do you have any questions about your discharge instructions: Diet   No. Medications  No. Follow up visit  No.  Do you have questions or concerns about your Care? No.  Actions: * If pain score is 4 or above: No action needed, pain <4.  Pt did report he had a small amount of bright red blood after wiping his anus this am.  I asked if he could compare the amount of bleeding to a size of coin.  Pt said it was smaller than a penny.  I explained we did removed a couple polyps, and it was not uncommon to see a small amount of blood.  I asked to pt call us back if amount of bleeding increases, clots of blood or bleeding does not stop.  Pt said he would.  maw

## 2017-10-24 ENCOUNTER — Encounter: Payer: Self-pay | Admitting: Internal Medicine

## 2018-02-20 DIAGNOSIS — L570 Actinic keratosis: Secondary | ICD-10-CM | POA: Diagnosis not present

## 2018-02-20 DIAGNOSIS — C4441 Basal cell carcinoma of skin of scalp and neck: Secondary | ICD-10-CM | POA: Diagnosis not present

## 2018-02-20 DIAGNOSIS — X32XXXD Exposure to sunlight, subsequent encounter: Secondary | ICD-10-CM | POA: Diagnosis not present

## 2018-02-20 DIAGNOSIS — Z85828 Personal history of other malignant neoplasm of skin: Secondary | ICD-10-CM | POA: Diagnosis not present

## 2018-02-20 DIAGNOSIS — Z08 Encounter for follow-up examination after completed treatment for malignant neoplasm: Secondary | ICD-10-CM | POA: Diagnosis not present

## 2018-03-04 DIAGNOSIS — L820 Inflamed seborrheic keratosis: Secondary | ICD-10-CM | POA: Diagnosis not present

## 2018-03-04 DIAGNOSIS — D1800 Hemangioma unspecified site: Secondary | ICD-10-CM | POA: Diagnosis not present

## 2018-03-04 DIAGNOSIS — D489 Neoplasm of uncertain behavior, unspecified: Secondary | ICD-10-CM | POA: Diagnosis not present

## 2018-03-04 DIAGNOSIS — L853 Xerosis cutis: Secondary | ICD-10-CM | POA: Diagnosis not present

## 2018-03-04 DIAGNOSIS — L570 Actinic keratosis: Secondary | ICD-10-CM | POA: Diagnosis not present

## 2018-03-04 DIAGNOSIS — C44519 Basal cell carcinoma of skin of other part of trunk: Secondary | ICD-10-CM | POA: Diagnosis not present

## 2018-03-04 DIAGNOSIS — L814 Other melanin hyperpigmentation: Secondary | ICD-10-CM | POA: Diagnosis not present

## 2018-03-15 DIAGNOSIS — C44519 Basal cell carcinoma of skin of other part of trunk: Secondary | ICD-10-CM | POA: Diagnosis not present

## 2018-03-20 DIAGNOSIS — Z5189 Encounter for other specified aftercare: Secondary | ICD-10-CM | POA: Diagnosis not present

## 2018-03-29 DIAGNOSIS — Z4802 Encounter for removal of sutures: Secondary | ICD-10-CM | POA: Diagnosis not present

## 2018-04-01 DIAGNOSIS — H26491 Other secondary cataract, right eye: Secondary | ICD-10-CM | POA: Diagnosis not present

## 2018-04-01 DIAGNOSIS — Z5189 Encounter for other specified aftercare: Secondary | ICD-10-CM | POA: Diagnosis not present

## 2018-04-18 DIAGNOSIS — K627 Radiation proctitis: Secondary | ICD-10-CM | POA: Diagnosis not present

## 2018-04-18 DIAGNOSIS — I719 Aortic aneurysm of unspecified site, without rupture: Secondary | ICD-10-CM | POA: Diagnosis not present

## 2018-04-18 DIAGNOSIS — R030 Elevated blood-pressure reading, without diagnosis of hypertension: Secondary | ICD-10-CM | POA: Diagnosis not present

## 2018-04-18 DIAGNOSIS — K635 Polyp of colon: Secondary | ICD-10-CM | POA: Diagnosis not present

## 2018-04-23 ENCOUNTER — Other Ambulatory Visit: Payer: Self-pay | Admitting: Internal Medicine

## 2018-04-23 DIAGNOSIS — C4441 Basal cell carcinoma of skin of scalp and neck: Secondary | ICD-10-CM | POA: Diagnosis not present

## 2018-04-23 DIAGNOSIS — L570 Actinic keratosis: Secondary | ICD-10-CM | POA: Diagnosis not present

## 2018-04-23 DIAGNOSIS — I7121 Aneurysm of the ascending aorta, without rupture: Secondary | ICD-10-CM

## 2018-04-23 DIAGNOSIS — Z85828 Personal history of other malignant neoplasm of skin: Secondary | ICD-10-CM | POA: Diagnosis not present

## 2018-04-23 DIAGNOSIS — Z08 Encounter for follow-up examination after completed treatment for malignant neoplasm: Secondary | ICD-10-CM | POA: Diagnosis not present

## 2018-04-23 DIAGNOSIS — X32XXXD Exposure to sunlight, subsequent encounter: Secondary | ICD-10-CM | POA: Diagnosis not present

## 2018-04-23 DIAGNOSIS — I712 Thoracic aortic aneurysm, without rupture: Secondary | ICD-10-CM

## 2018-05-01 ENCOUNTER — Ambulatory Visit
Admission: RE | Admit: 2018-05-01 | Discharge: 2018-05-01 | Disposition: A | Discharge status: Home / Self Care | Payer: PPO | Source: Ambulatory Visit | Attending: Internal Medicine | Admitting: Internal Medicine

## 2018-05-01 DIAGNOSIS — I712 Thoracic aortic aneurysm, without rupture: Secondary | ICD-10-CM | POA: Diagnosis not present

## 2018-05-01 DIAGNOSIS — I7121 Aneurysm of the ascending aorta, without rupture: Secondary | ICD-10-CM

## 2018-05-01 MED ORDER — IOPAMIDOL (ISOVUE-370) INJECTION 76%
75.0000 mL | Freq: Once | INTRAVENOUS | Status: AC | PRN
  Administered 2018-05-01: 75 mL via INTRAVENOUS

## 2018-05-10 DIAGNOSIS — H43391 Other vitreous opacities, right eye: Secondary | ICD-10-CM | POA: Diagnosis not present

## 2018-05-10 DIAGNOSIS — H3540 Unspecified peripheral retinal degeneration: Secondary | ICD-10-CM | POA: Diagnosis not present

## 2018-05-10 DIAGNOSIS — H02831 Dermatochalasis of right upper eyelid: Secondary | ICD-10-CM | POA: Diagnosis not present

## 2018-05-10 DIAGNOSIS — H02834 Dermatochalasis of left upper eyelid: Secondary | ICD-10-CM | POA: Diagnosis not present

## 2018-05-21 DIAGNOSIS — Z08 Encounter for follow-up examination after completed treatment for malignant neoplasm: Secondary | ICD-10-CM | POA: Diagnosis not present

## 2018-05-21 DIAGNOSIS — D040 Carcinoma in situ of skin of lip: Secondary | ICD-10-CM | POA: Diagnosis not present

## 2018-05-21 DIAGNOSIS — C44319 Basal cell carcinoma of skin of other parts of face: Secondary | ICD-10-CM | POA: Diagnosis not present

## 2018-05-21 DIAGNOSIS — L570 Actinic keratosis: Secondary | ICD-10-CM | POA: Diagnosis not present

## 2018-05-21 DIAGNOSIS — X32XXXD Exposure to sunlight, subsequent encounter: Secondary | ICD-10-CM | POA: Diagnosis not present

## 2018-05-21 DIAGNOSIS — Z85828 Personal history of other malignant neoplasm of skin: Secondary | ICD-10-CM | POA: Diagnosis not present

## 2018-06-20 DIAGNOSIS — H43391 Other vitreous opacities, right eye: Secondary | ICD-10-CM | POA: Diagnosis not present

## 2018-06-20 DIAGNOSIS — H33331 Multiple defects of retina without detachment, right eye: Secondary | ICD-10-CM | POA: Diagnosis not present

## 2018-06-20 DIAGNOSIS — H35372 Puckering of macula, left eye: Secondary | ICD-10-CM | POA: Diagnosis not present

## 2018-06-20 DIAGNOSIS — H33332 Multiple defects of retina without detachment, left eye: Secondary | ICD-10-CM | POA: Diagnosis not present

## 2018-06-25 DIAGNOSIS — I872 Venous insufficiency (chronic) (peripheral): Secondary | ICD-10-CM | POA: Diagnosis not present

## 2018-06-25 DIAGNOSIS — Z08 Encounter for follow-up examination after completed treatment for malignant neoplasm: Secondary | ICD-10-CM | POA: Diagnosis not present

## 2018-06-25 DIAGNOSIS — Z23 Encounter for immunization: Secondary | ICD-10-CM | POA: Diagnosis not present

## 2018-06-25 DIAGNOSIS — X32XXXD Exposure to sunlight, subsequent encounter: Secondary | ICD-10-CM | POA: Diagnosis not present

## 2018-06-25 DIAGNOSIS — C44319 Basal cell carcinoma of skin of other parts of face: Secondary | ICD-10-CM | POA: Diagnosis not present

## 2018-06-25 DIAGNOSIS — C4441 Basal cell carcinoma of skin of scalp and neck: Secondary | ICD-10-CM | POA: Diagnosis not present

## 2018-06-25 DIAGNOSIS — Z85828 Personal history of other malignant neoplasm of skin: Secondary | ICD-10-CM | POA: Diagnosis not present

## 2018-06-25 DIAGNOSIS — D040 Carcinoma in situ of skin of lip: Secondary | ICD-10-CM | POA: Diagnosis not present

## 2018-06-25 DIAGNOSIS — L570 Actinic keratosis: Secondary | ICD-10-CM | POA: Diagnosis not present

## 2018-07-08 DIAGNOSIS — Z23 Encounter for immunization: Secondary | ICD-10-CM | POA: Diagnosis not present

## 2018-07-30 DIAGNOSIS — L570 Actinic keratosis: Secondary | ICD-10-CM | POA: Diagnosis not present

## 2018-07-30 DIAGNOSIS — I872 Venous insufficiency (chronic) (peripheral): Secondary | ICD-10-CM | POA: Diagnosis not present

## 2018-07-30 DIAGNOSIS — C44319 Basal cell carcinoma of skin of other parts of face: Secondary | ICD-10-CM | POA: Diagnosis not present

## 2018-07-30 DIAGNOSIS — C4441 Basal cell carcinoma of skin of scalp and neck: Secondary | ICD-10-CM | POA: Diagnosis not present

## 2018-07-30 DIAGNOSIS — D040 Carcinoma in situ of skin of lip: Secondary | ICD-10-CM | POA: Diagnosis not present

## 2018-07-30 DIAGNOSIS — X32XXXD Exposure to sunlight, subsequent encounter: Secondary | ICD-10-CM | POA: Diagnosis not present

## 2018-09-10 DIAGNOSIS — Z08 Encounter for follow-up examination after completed treatment for malignant neoplasm: Secondary | ICD-10-CM | POA: Diagnosis not present

## 2018-09-10 DIAGNOSIS — X32XXXD Exposure to sunlight, subsequent encounter: Secondary | ICD-10-CM | POA: Diagnosis not present

## 2018-09-10 DIAGNOSIS — Z85828 Personal history of other malignant neoplasm of skin: Secondary | ICD-10-CM | POA: Diagnosis not present

## 2018-09-10 DIAGNOSIS — L570 Actinic keratosis: Secondary | ICD-10-CM | POA: Diagnosis not present

## 2018-09-10 DIAGNOSIS — C44311 Basal cell carcinoma of skin of nose: Secondary | ICD-10-CM | POA: Diagnosis not present

## 2018-09-10 DIAGNOSIS — D044 Carcinoma in situ of skin of scalp and neck: Secondary | ICD-10-CM | POA: Diagnosis not present

## 2018-10-21 DIAGNOSIS — D225 Melanocytic nevi of trunk: Secondary | ICD-10-CM | POA: Diagnosis not present

## 2018-10-21 DIAGNOSIS — L814 Other melanin hyperpigmentation: Secondary | ICD-10-CM | POA: Diagnosis not present

## 2018-10-21 DIAGNOSIS — D1800 Hemangioma unspecified site: Secondary | ICD-10-CM | POA: Diagnosis not present

## 2018-10-21 DIAGNOSIS — L570 Actinic keratosis: Secondary | ICD-10-CM | POA: Diagnosis not present

## 2018-10-21 DIAGNOSIS — L853 Xerosis cutis: Secondary | ICD-10-CM | POA: Diagnosis not present

## 2018-10-21 DIAGNOSIS — D485 Neoplasm of uncertain behavior of skin: Secondary | ICD-10-CM | POA: Diagnosis not present

## 2018-10-23 DIAGNOSIS — E041 Nontoxic single thyroid nodule: Secondary | ICD-10-CM | POA: Diagnosis not present

## 2018-10-23 DIAGNOSIS — I712 Thoracic aortic aneurysm, without rupture: Secondary | ICD-10-CM | POA: Diagnosis not present

## 2018-10-23 DIAGNOSIS — K639 Disease of intestine, unspecified: Secondary | ICD-10-CM | POA: Diagnosis not present

## 2018-10-23 DIAGNOSIS — I10 Essential (primary) hypertension: Secondary | ICD-10-CM | POA: Diagnosis not present

## 2018-10-23 DIAGNOSIS — Z6841 Body Mass Index (BMI) 40.0 and over, adult: Secondary | ICD-10-CM | POA: Diagnosis not present

## 2018-10-23 DIAGNOSIS — C61 Malignant neoplasm of prostate: Secondary | ICD-10-CM | POA: Diagnosis not present

## 2018-10-23 DIAGNOSIS — Z125 Encounter for screening for malignant neoplasm of prostate: Secondary | ICD-10-CM | POA: Diagnosis not present

## 2018-10-23 DIAGNOSIS — C4490 Unspecified malignant neoplasm of skin, unspecified: Secondary | ICD-10-CM | POA: Diagnosis not present

## 2019-01-13 DIAGNOSIS — Z1211 Encounter for screening for malignant neoplasm of colon: Secondary | ICD-10-CM | POA: Diagnosis not present

## 2019-01-13 DIAGNOSIS — I4892 Unspecified atrial flutter: Secondary | ICD-10-CM | POA: Diagnosis not present

## 2019-01-13 DIAGNOSIS — Z86010 Personal history of colonic polyps: Secondary | ICD-10-CM | POA: Diagnosis not present

## 2019-01-13 DIAGNOSIS — R194 Change in bowel habit: Secondary | ICD-10-CM | POA: Diagnosis not present

## 2019-01-13 DIAGNOSIS — Z800 Family history of malignant neoplasm of digestive organs: Secondary | ICD-10-CM | POA: Diagnosis not present

## 2019-01-17 DIAGNOSIS — X32XXXD Exposure to sunlight, subsequent encounter: Secondary | ICD-10-CM | POA: Diagnosis not present

## 2019-01-17 DIAGNOSIS — L570 Actinic keratosis: Secondary | ICD-10-CM | POA: Diagnosis not present

## 2019-01-17 DIAGNOSIS — Z85828 Personal history of other malignant neoplasm of skin: Secondary | ICD-10-CM | POA: Diagnosis not present

## 2019-01-17 DIAGNOSIS — Z08 Encounter for follow-up examination after completed treatment for malignant neoplasm: Secondary | ICD-10-CM | POA: Diagnosis not present

## 2019-02-15 DIAGNOSIS — Z01818 Encounter for other preprocedural examination: Secondary | ICD-10-CM | POA: Diagnosis not present

## 2019-02-17 DIAGNOSIS — I129 Hypertensive chronic kidney disease with stage 1 through stage 4 chronic kidney disease, or unspecified chronic kidney disease: Secondary | ICD-10-CM | POA: Diagnosis not present

## 2019-02-17 DIAGNOSIS — K648 Other hemorrhoids: Secondary | ICD-10-CM | POA: Diagnosis not present

## 2019-02-17 DIAGNOSIS — D120 Benign neoplasm of cecum: Secondary | ICD-10-CM | POA: Diagnosis not present

## 2019-02-17 DIAGNOSIS — N189 Chronic kidney disease, unspecified: Secondary | ICD-10-CM | POA: Diagnosis not present

## 2019-02-17 DIAGNOSIS — K219 Gastro-esophageal reflux disease without esophagitis: Secondary | ICD-10-CM | POA: Diagnosis not present

## 2019-02-17 DIAGNOSIS — D124 Benign neoplasm of descending colon: Secondary | ICD-10-CM | POA: Diagnosis not present

## 2019-02-17 DIAGNOSIS — D123 Benign neoplasm of transverse colon: Secondary | ICD-10-CM | POA: Diagnosis not present

## 2019-02-17 DIAGNOSIS — D122 Benign neoplasm of ascending colon: Secondary | ICD-10-CM | POA: Diagnosis not present

## 2019-02-17 DIAGNOSIS — I4892 Unspecified atrial flutter: Secondary | ICD-10-CM | POA: Diagnosis not present

## 2019-02-17 DIAGNOSIS — Z1211 Encounter for screening for malignant neoplasm of colon: Secondary | ICD-10-CM | POA: Diagnosis not present

## 2019-02-20 DIAGNOSIS — N401 Enlarged prostate with lower urinary tract symptoms: Secondary | ICD-10-CM | POA: Diagnosis not present

## 2019-02-20 DIAGNOSIS — I4892 Unspecified atrial flutter: Secondary | ICD-10-CM | POA: Diagnosis not present

## 2019-02-20 DIAGNOSIS — E785 Hyperlipidemia, unspecified: Secondary | ICD-10-CM | POA: Diagnosis not present

## 2019-02-20 DIAGNOSIS — I10 Essential (primary) hypertension: Secondary | ICD-10-CM | POA: Diagnosis not present

## 2019-02-20 DIAGNOSIS — R3129 Other microscopic hematuria: Secondary | ICD-10-CM | POA: Diagnosis not present

## 2019-02-20 DIAGNOSIS — Z0000 Encounter for general adult medical examination without abnormal findings: Secondary | ICD-10-CM | POA: Diagnosis not present

## 2019-02-20 DIAGNOSIS — D0359 Melanoma in situ of other part of trunk: Secondary | ICD-10-CM | POA: Diagnosis not present

## 2019-02-20 DIAGNOSIS — R351 Nocturia: Secondary | ICD-10-CM | POA: Diagnosis not present

## 2019-02-27 DIAGNOSIS — N200 Calculus of kidney: Secondary | ICD-10-CM | POA: Diagnosis not present

## 2019-02-27 DIAGNOSIS — E785 Hyperlipidemia, unspecified: Secondary | ICD-10-CM | POA: Diagnosis not present

## 2019-02-27 DIAGNOSIS — Z86010 Personal history of colonic polyps: Secondary | ICD-10-CM | POA: Diagnosis not present

## 2019-02-27 DIAGNOSIS — I4892 Unspecified atrial flutter: Secondary | ICD-10-CM | POA: Diagnosis not present

## 2019-02-27 DIAGNOSIS — I10 Essential (primary) hypertension: Secondary | ICD-10-CM | POA: Diagnosis not present

## 2019-03-03 DIAGNOSIS — N201 Calculus of ureter: Secondary | ICD-10-CM | POA: Diagnosis not present

## 2019-03-03 DIAGNOSIS — N200 Calculus of kidney: Secondary | ICD-10-CM | POA: Diagnosis not present

## 2019-03-03 DIAGNOSIS — N400 Enlarged prostate without lower urinary tract symptoms: Secondary | ICD-10-CM | POA: Diagnosis not present

## 2019-03-10 ENCOUNTER — Emergency Department (HOSPITAL_COMMUNITY)
Admission: EM | Admit: 2019-03-10 | Discharge: 2019-03-10 | Disposition: A | Discharge status: Home / Self Care | Payer: PPO | Attending: Emergency Medicine | Admitting: Emergency Medicine

## 2019-03-10 ENCOUNTER — Encounter (HOSPITAL_COMMUNITY): Payer: Self-pay

## 2019-03-10 ENCOUNTER — Emergency Department (HOSPITAL_COMMUNITY): Payer: PPO

## 2019-03-10 ENCOUNTER — Ambulatory Visit: Payer: PPO | Admitting: Cardiology

## 2019-03-10 ENCOUNTER — Other Ambulatory Visit: Payer: Self-pay

## 2019-03-10 DIAGNOSIS — Z8546 Personal history of malignant neoplasm of prostate: Secondary | ICD-10-CM | POA: Insufficient documentation

## 2019-03-10 DIAGNOSIS — R079 Chest pain, unspecified: Secondary | ICD-10-CM | POA: Insufficient documentation

## 2019-03-10 DIAGNOSIS — I10 Essential (primary) hypertension: Secondary | ICD-10-CM | POA: Diagnosis not present

## 2019-03-10 DIAGNOSIS — Z87891 Personal history of nicotine dependence: Secondary | ICD-10-CM | POA: Diagnosis not present

## 2019-03-10 DIAGNOSIS — R0789 Other chest pain: Secondary | ICD-10-CM | POA: Diagnosis present

## 2019-03-10 DIAGNOSIS — R072 Precordial pain: Secondary | ICD-10-CM | POA: Diagnosis not present

## 2019-03-10 DIAGNOSIS — Z85828 Personal history of other malignant neoplasm of skin: Secondary | ICD-10-CM | POA: Diagnosis not present

## 2019-03-10 DIAGNOSIS — R0602 Shortness of breath: Secondary | ICD-10-CM | POA: Diagnosis not present

## 2019-03-10 LAB — CBC WITH DIFFERENTIAL/PLATELET
Abs Immature Granulocytes: 0.02 10*3/uL (ref 0.00–0.07)
Basophils Absolute: 0.1 10*3/uL (ref 0.0–0.1)
Basophils Relative: 1 %
Eosinophils Absolute: 0.1 10*3/uL (ref 0.0–0.5)
Eosinophils Relative: 2 %
HCT: 49.8 % (ref 39.0–52.0)
Hemoglobin: 16.9 g/dL (ref 13.0–17.0)
Immature Granulocytes: 0 %
Lymphocytes Relative: 23 %
Lymphs Abs: 1.5 10*3/uL (ref 0.7–4.0)
MCH: 32.3 pg (ref 26.0–34.0)
MCHC: 33.9 g/dL (ref 30.0–36.0)
MCV: 95 fL (ref 80.0–100.0)
Monocytes Absolute: 0.6 10*3/uL (ref 0.1–1.0)
Monocytes Relative: 9 %
Neutro Abs: 4.3 10*3/uL (ref 1.7–7.7)
Neutrophils Relative %: 65 %
Platelets: 165 10*3/uL (ref 150–400)
RBC: 5.24 MIL/uL (ref 4.22–5.81)
RDW: 13.4 % (ref 11.5–15.5)
WBC: 6.6 10*3/uL (ref 4.0–10.5)
nRBC: 0 % (ref 0.0–0.2)

## 2019-03-10 LAB — BASIC METABOLIC PANEL
Anion gap: 11 (ref 5–15)
BUN: 23 mg/dL (ref 8–23)
CO2: 27 mmol/L (ref 22–32)
Calcium: 9.3 mg/dL (ref 8.9–10.3)
Chloride: 101 mmol/L (ref 98–111)
Creatinine, Ser: 1.16 mg/dL (ref 0.61–1.24)
GFR calc Af Amer: >60 mL/min (ref >60)
GFR calc non Af Amer: >60 mL/min (ref >60)
Glucose, Bld: 104 mg/dL — ABNORMAL HIGH (ref 70–99)
Potassium: 3.9 mmol/L (ref 3.5–5.1)
Sodium: 139 mmol/L (ref 135–145)

## 2019-03-10 LAB — TROPONIN I (HIGH SENSITIVITY)
Troponin I (High Sensitivity): 6 ng/L (ref <18)
Troponin I (High Sensitivity): 5 ng/L (ref <18)

## 2019-03-10 MED ORDER — FAMOTIDINE 20 MG PO TABS: 20.0000 mg | ORAL_TABLET | Freq: Two times a day (BID) | ORAL | 0 refills | Status: DC

## 2019-03-10 NOTE — ED Provider Notes (Signed)
[facility] EMERGENCY DEPARTMENT Provider Note   CSN: [id number] Arrival date & time: 03/10/19  1131     History   Chief Complaint No chief complaint on file.   HPI Kevin Day is a 78 y.o. male.     78 year old male with prior medical history as detailed below presents for evaluation of intermittent chest pressure.  Patient reports intermittent chest pressure and discomfort over the last 3 weeks.  He reports that his discomfort is a dull tightness.  It will come and go.  He denies any specific inciting actions.  He denies prior history of CAD or ACS.  He denies current chest discomfort.  He denies associated fever, shortness of breath, nausea, vomiting, or other acute complaint.  He was seen by his regular care provider this morning and sent to the ED for evaluation.  He reports an apparently normal stress test performed multiple years prior.  The history is provided by the patient and medical records.  Chest Pain Pain location:  Substernal area Pain quality: pressure   Pain radiates to:  Does not radiate Pain severity:  No pain Onset quality:  Gradual Duration:  3 weeks Timing:  Intermittent Progression:  Waxing and waning Chronicity:  New Relieved by:  Nothing Worsened by:  Nothing Ineffective treatments:  None tried   Past Medical History:  Diagnosis Date  . Arthritis   . Cataracts, bilateral   . Hypertension   . Prostate cancer ([place])   . Prostate cancer ([place])   . Prostate cancer ([place])   . Ruptured lumbar disc   . Skin cancer     There are no active problems to display for this patient.   Past Surgical History:  Procedure Laterality Date  . BACK SURGERY    . cataract Bilateral   . COLONOSCOPY    . POLYPECTOMY    . radioactive      radioactive seed implants for prostate CA.         Home Medications    Prior to Admission medications   Medication Sig Start Date End Date Taking? Authorizing Provider  aspirin EC 81 MG tablet  Take 81 mg by mouth daily.    [provider]  glucosamine-chondroitin 500-400 MG tablet Take 1 tablet by mouth every morning.    [provider]  Multiple Vitamins-Minerals (OCUVITE ADULT 50+ PO) Take 1 capsule by mouth daily.    [provider]  Omega-3 Fatty Acids (FISH OIL PO) Take by mouth.    [provider]  Omega-3 Fatty Acids (FISH OIL) 1000 MG CAPS Take 4 capsules by mouth every morning.    [provider]  RESTASIS MULTIDOSE 0.05 % ophthalmic emulsion INSTILL 1 DROP INTO EACH EYE Q 12 H 08/29/16   [provider]  triamterene-hydrochlorothiazide (DYAZIDE) 37.5-25 MG capsule Take 1 capsule by mouth daily.    [provider]    Family History Family History  Problem Relation Age of Onset  . Breast cancer Sister   . Colon cancer Neg Hx   . Esophageal cancer Neg Hx   . Rectal cancer Neg Hx   . Stomach cancer Neg Hx     Social History Social History   Tobacco Use  . Smoking status: Former Research scientist (life sciences)  . Smokeless tobacco: Never Used  Substance Use Topics  . Alcohol use: No  . Drug use: No     Allergies   Patient has no allergy information on record.   Review of Systems Review  of Systems  Cardiovascular: Positive for chest pain.  All other systems reviewed and are negative.    Physical Exam Updated Vital Signs BP (!) 176/85 (BP Location: Right Arm)   Pulse 60   Temp (!) 97.5 F (36.4 C) (Oral)   Resp 16   Ht 5\' 10"  (1.778 m)   Wt 92.5 kg   SpO2 98%   BMI 29.27 kg/m   Physical Exam Vitals signs and nursing note reviewed.  Constitutional:      General: He is not in acute distress.    Appearance: Normal appearance. He is well-developed.  HENT:     Head: Normocephalic and atraumatic.  Eyes:     Conjunctiva/sclera: Conjunctivae normal.     Pupils: Pupils are equal, round, and reactive to light.  Neck:     Musculoskeletal: Normal range of motion and neck supple.  Cardiovascular:     Rate and  Rhythm: Normal rate and regular rhythm.     Heart sounds: Normal heart sounds.  Pulmonary:     Effort: Pulmonary effort is normal. No respiratory distress.     Breath sounds: Normal breath sounds.  Abdominal:     General: There is no distension.     Palpations: Abdomen is soft.     Tenderness: There is no abdominal tenderness.  Musculoskeletal: Normal range of motion.        General: No deformity.  Skin:    General: Skin is warm and dry.  Neurological:     Mental Status: He is alert and oriented to person, place, and time. Mental status is at baseline.      ED Treatments / Results  Labs (all labs ordered are listed, but only abnormal results are displayed) Labs Reviewed  BASIC METABOLIC PANEL - Abnormal; Notable for the following components:      Result Value   Glucose, Bld 104 (*)    All other components within normal limits  CBC WITH DIFFERENTIAL/PLATELET  TROPONIN I (HIGH SENSITIVITY)  TROPONIN I (HIGH SENSITIVITY)    EKG EKG Interpretation  Date/Time:  Monday March 10 2019 11:41:39 EDT Ventricular Rate:  60 PR Interval:    QRS Duration: 111 QT Interval:  442 QTC Calculation: 442 R Axis:   -29 Text Interpretation:  Sinus rhythm Borderline left axis deviation Confirmed by Dene Gentry [phone]) on 03/10/2019 11:47:00 AM   Radiology Dg Chest Port 1 View  Result Date: 03/10/2019 CLINICAL DATA:  Chest tightness and shortness of breath. EXAM: PORTABLE CHEST 1 VIEW COMPARISON:  CTA chest dated May 01, 2018. Chest x-ray dated December 02, 2009. FINDINGS: The heart size and mediastinal contours are within normal limits. Both lungs are clear. The visualized skeletal structures are unremarkable. IMPRESSION: No active disease. Electronically Signed   By: Titus Dubin M.D.   On: 03/10/2019 12:19    Procedures Procedures (including critical care time)  Medications Ordered in ED Medications - No data to display   Initial Impression / Assessment and Plan / ED Course   I have reviewed the triage vital signs and the nursing notes.  Pertinent labs & imaging results that were available during my care of the patient were reviewed by me and considered in my medical decision making (see chart for details).        MDM  Screen complete  Kevin Day was evaluated in Emergency Department on 03/10/2019 for the symptoms described in the history of present illness. He was evaluated in the context of the global COVID-19 pandemic, which necessitated  consideration that the patient might be at risk for infection with the SARS-CoV-2 virus that causes COVID-19. Institutional protocols and algorithms that pertain to the evaluation of patients at risk for COVID-19 are in a state of rapid change based on information released by regulatory bodies including the CDC and federal and state organizations. These policies and algorithms were followed during the patient's care in the ED.   Patient is presenting for evaluation of reported chest pain.  Patient's EKG is without evidence of acute ischemia.  Troponin x2 is negative.  Patient seen and evaluated by cardiology and deemed appropriate for discharge.  Patient does understand need for close follow-up.  Strict return precautions given and understood.  Final Clinical Impressions(s) / ED Diagnoses   Final diagnoses:  Chest pain, unspecified type    ED Discharge Orders    None       Valarie Merino, MD 03/10/19 1521

## 2019-03-10 NOTE — ED Triage Notes (Signed)
Chest pain continuous for 3 weeks 3/10 pain scale, dr did ekg, but still referred to Ed for care

## 2019-03-10 NOTE — Discharge Instructions (Signed)
Return for any problem.  Follow-up with your regular care providers as instructed. 

## 2019-03-10 NOTE — ED Notes (Signed)
Got patient undress on the monitor did ekg shown to Dr Francia Greaves patient is resting with call bell in reach

## 2019-03-10 NOTE — Consult Note (Signed)
Cardiology Admission Consult:   Patient ID: ABRHAM MASLOWSKI MRN: [id number]; DOB: 03-Feb-1941   Admission date: 03/10/2019  Primary Care Provider: Levin Erp, MD Primary Cardiologist: New Primary Electrophysiologist:  None   Chief Complaint:  Chest pain   Patient Profile:   Kevin Day is a 78 y.o. male with a h/o HTN, borderline HLD, prostate cancer and no prior cardiac history, who is being seen for the evaluation of chest pain, at the request of Dr. Francia Greaves, Emergency Medicine.   History of Present Illness:   Mr. Sciuto has no prior cardiac history. CRFs include HTN treated w/ medication.  He also reports borderline hyperlipidemia but not on medications.  He is a former smoker but quit 20 years ago.  No family history of cardiovascular disease.  Patient is fairly active at baseline.  He walks for exercise on a daily basis, usually 1 mile around his neighborhood in the evenings without limitations.  He also mows his own lawn with a push mower.  He last did this 2 weeks ago without any difficulties.  He went on his daily walk yesterday evening without any exertional symptoms.   Patient reports to the ED today, as advised by his PCP, given a 3-week history of right-sided chest pain.  Described as chest tightness.  Nonradiating.  He denies any recent triggers.  No recent heavy lifting, straining, overhead activities or injury to that area.  Chest pain has occurred almost every day over the last 3 weeks and has been fairly constant.  Pain is not worsened by physical activity.  It is nonpleuritic.  Not reproducible or exacerbated with palpation of chest wall.  He has noticed mild worsening of symptoms after p.o. intake.  Sometimes worse after eating meals but he has particularly found that symptoms worsen after drinking orange juice.  He has had some associated belching however no relief of symptoms.  He tried Tums a few nights ago however no significant relief of symptoms.  Patient has also noticed  some mild increased dyspnea at rest however no dyspnea with exertion.  Pain at its worst has been a 2 out of 10.  Very mild.  He went to his PCP today given persistent chest pain.  At PCP office he was given 2 sublingual nitroglycerin tablets without any relief of symptoms and was thus advised to come to the ED for further evaluation.   In the ED, EKG shows NSR 60 bpm. No ST abnormalities.  BP elevated in the ED 176/85. Initial Hs troponin normal at 6 ng/L. 2nd troponin pending.  CBC and BMP unremarkable.  SCr 1.16.  Chest x-ray normal  Heart Pathway Score:     Past Medical History:  Diagnosis Date  . Arthritis   . Cataracts, bilateral   . Hypertension   . Prostate cancer ([place])   . Prostate cancer ([place])   . Prostate cancer ([place])   . Ruptured lumbar disc   . Skin cancer     Past Surgical History:  Procedure Laterality Date  . BACK SURGERY    . cataract Bilateral   . COLONOSCOPY    . POLYPECTOMY    . radioactive      radioactive seed implants for prostate CA.      Medications Prior to Admission: Prior to Admission medications   Medication Sig Start Date End Date Taking? Authorizing Provider  augmented betamethasone dipropionate (DIPROLENE-AF) 0.05 % cream Apply 1 application topically 2 (two) times daily as needed for rash. Face and groin 02/19/19  Yes [provider]  Omega-3 Fatty Acids (FISH OIL PO) Take 1 capsule by mouth daily.    Yes [provider]  triamterene-hydrochlorothiazide (DYAZIDE) 37.5-25 MG capsule Take 1 capsule by mouth daily.   Yes [provider]     Allergies:   No Known Allergies  Social History:   Social History   Socioeconomic History  . Marital status: Married    Spouse name: Not on file  . Number of children: Not on file  . Years of education: Not on file  . Highest education level: Not on file  Occupational History  . Not on file  Social Needs  . Financial resource strain: Not on file  . Food insecurity    Worry:  Not on file    Inability: Not on file  . Transportation needs    Medical: Not on file    Non-medical: Not on file  Tobacco Use  . Smoking status: Former Research scientist (life sciences)  . Smokeless tobacco: Never Used  Substance and Sexual Activity  . Alcohol use: No  . Drug use: No  . Sexual activity: Not on file  Lifestyle  . Physical activity    Days per week: Not on file    Minutes per session: Not on file  . Stress: Not on file  Relationships  . Social Herbalist on phone: Not on file    Gets together: Not on file    Attends religious service: Not on file    Active member of club or organization: Not on file    Attends meetings of clubs or organizations: Not on file    Relationship status: Not on file  . Intimate partner violence    Fear of current or ex partner: Not on file    Emotionally abused: Not on file    Physically abused: Not on file    Forced sexual activity: Not on file  Other Topics Concern  . Not on file  Social History Narrative  . Not on file    Family History:   The patient's family history includes Breast cancer in his sister. There is no history of Colon cancer, Esophageal cancer, Rectal cancer, or Stomach cancer.    ROS:  Please see the history of present illness.  All other ROS reviewed and negative.     Physical Exam/Data:   Vitals:   03/10/19 1141 03/10/19 1145 03/10/19 1200  BP: (!) 176/85 (!) 149/67 (!) 151/72  Pulse: 60 (!) 51 60  Resp: 16 14 17   Temp: (!) 97.5 F (36.4 C)    TempSrc: Oral    SpO2: 98% 98% 96%  Weight: 92.5 kg    Height: 5\' 10"  (1.778 m)     No intake or output data in the 24 hours ending 03/10/19 1506 Last 3 Weights 03/10/2019 10/17/2017 10/11/2017  Weight (lbs) 204 lb 215 lb 216 lb  Weight (kg) 92.534 kg 97.523 kg 97.977 kg     Body mass index is 29.27 kg/m.  General:  Well nourished, well developed elderly WM in no acute distress HEENT: normal Lymph: no adenopathy Neck: no JVD, lipoma noted on rt aspect on anterior  neck, just below the jawline Endocrine:  No thryomegaly Vascular: No carotid bruits; FA pulses 2+ bilaterally without bruits  Cardiac:  normal S1, S2; RRR; no murmur  Lungs:  clear to auscultation bilaterally, no wheezing, rhonchi or rales  Abd: soft, nontender, no hepatomegaly  Ext: no edema Musculoskeletal:  No deformities, BUE and BLE  strength normal and equal Skin: warm and dry  Neuro:  CNs 2-12 intact, no focal abnormalities noted Psych:  Normal affect    EKG:  The ECG that was done 03/10/19 was personally reviewed and demonstrates NSR, 90 bpm no ST abnormalities, no prior EKGs for comparison.   Relevant CV Studies: None   Laboratory Data:  High Sensitivity Troponin:   Recent Labs  Lab 03/10/19 1154  TROPONINIHS 6      Cardiac EnzymesNo results for input(s): TROPONINI in the last 168 hours. No results for input(s): TROPIPOC in the last 168 hours.  Chemistry Recent Labs  Lab 03/10/19 1154  NA 139  K 3.9  CL 101  CO2 27  GLUCOSE 104*  BUN 23  CREATININE 1.16  CALCIUM 9.3  GFRNONAA >60  GFRAA >60  ANIONGAP 11    No results for input(s): PROT, ALBUMIN, AST, ALT, ALKPHOS, BILITOT in the last 168 hours. Hematology Recent Labs  Lab 03/10/19 1154  WBC 6.6  RBC 5.24  HGB 16.9  HCT 49.8  MCV 95.0  MCH 32.3  MCHC 33.9  RDW 13.4  PLT 165   BNPNo results for input(s): BNP, PROBNP in the last 168 hours.  DDimer No results for input(s): DDIMER in the last 168 hours.   Radiology/Studies:  Dg Chest Port 1 View  Result Date: 03/10/2019 CLINICAL DATA:  Chest tightness and shortness of breath. EXAM: PORTABLE CHEST 1 VIEW COMPARISON:  CTA chest dated May 01, 2018. Chest x-ray dated December 02, 2009. FINDINGS: The heart size and mediastinal contours are within normal limits. Both lungs are clear. The visualized skeletal structures are unremarkable. IMPRESSION: No active disease. Electronically Signed   By: Titus Dubin M.D.   On: 03/10/2019 12:19    Assessment  and Plan:   Kevin Day is a 78 y.o. male with a h/o HTN, borderline HLD, prostate cancer and no prior cardiac history, presenting to the ED w/ CC of chest pain.   1. Chest Pain: mostly atypical characteristics w/ 3 week h/o persistent right sided CP, unchanged by physical activity. Symptoms exacerbated after PO intake, particuarlly worse after consuming orange juice suggesting possible GI etiology. EKG shows no ischemic abnormalties. Calculated Heart Pathway Score is 3. Initial troponin is normal at 6 ng/dL. 2nd troponin pending. If 2nd troponin is < 18 and delta trop < 5 ng/L, he can be discharged from the ED w/ plans to f/u with his PCP. If 2nd troponin > 18 or delta trop > 5, will plan to admit to obs and plan 2D echo and NST in the AM .   2. HTN: 151/72 in the ED. Continue home meds. Triamterene-hctz.    For questions or updates, please contact [place] Please consult [email or web address] for contact info under        Signed, Lyda Jester, PA-C  03/10/2019 3:06 PM

## 2019-03-11 ENCOUNTER — Other Ambulatory Visit: Payer: Self-pay | Admitting: Cardiology

## 2019-03-11 DIAGNOSIS — R079 Chest pain, unspecified: Secondary | ICD-10-CM

## 2019-03-14 ENCOUNTER — Telehealth (HOSPITAL_COMMUNITY): Payer: Self-pay | Admitting: Radiology

## 2019-03-14 NOTE — Telephone Encounter (Signed)
Patient given detailed instructions per Myocardial Perfusion Study Information Sheet for the test on 03/20/2019 at 8:00. Patient notified to arrive 15 minutes early and that it is imperative to arrive on time for appointment to keep from having the test rescheduled.  If you need to cancel or reschedule your appointment, please call the office within 24 hours of your appointment. . Patient verbalized understanding.EHK

## 2019-03-17 ENCOUNTER — Telehealth (HOSPITAL_COMMUNITY): Payer: Self-pay

## 2019-03-17 NOTE — Telephone Encounter (Signed)
Spoke with the patient, instructions given. He stated that he would be here for his test. Asked to call back with any questions. S.Noriko Macari EMTP 

## 2019-03-20 ENCOUNTER — Telehealth: Payer: Self-pay

## 2019-03-20 ENCOUNTER — Ambulatory Visit (HOSPITAL_COMMUNITY): Payer: PPO | Attending: Internal Medicine

## 2019-03-20 ENCOUNTER — Other Ambulatory Visit: Payer: Self-pay

## 2019-03-20 DIAGNOSIS — R079 Chest pain, unspecified: Secondary | ICD-10-CM | POA: Insufficient documentation

## 2019-03-20 LAB — MYOCARDIAL PERFUSION IMAGING
LV dias vol: 112 mL (ref 62–150)
LV sys vol: 56 mL
Peak HR: 96 {beats}/min
Rest HR: 60 {beats}/min
SDS: 2
SRS: 0
SSS: 2
TID: 1.07

## 2019-03-20 MED ORDER — TECHNETIUM TC 99M TETROFOSMIN IV KIT
31.9000 | PACK | Freq: Once | INTRAVENOUS | Status: AC | PRN
  Administered 2019-03-20: 31.9 via INTRAVENOUS
  Filled 2019-03-20: qty 32

## 2019-03-20 MED ORDER — REGADENOSON 0.4 MG/5ML IV SOLN
0.4000 mg | Freq: Once | INTRAVENOUS | Status: AC
  Administered 2019-03-20: 0.4 mg via INTRAVENOUS

## 2019-03-20 MED ORDER — TECHNETIUM TC 99M TETROFOSMIN IV KIT
10.9000 | PACK | Freq: Once | INTRAVENOUS | Status: AC | PRN
  Administered 2019-03-20: 10.9 via INTRAVENOUS
  Filled 2019-03-20: qty 11

## 2019-03-20 NOTE — Telephone Encounter (Signed)
    COVID-19 Pre-Screening Questions:  . In the past 7 to 10 days have you had a cough,  shortness of breath, headache, congestion, fever (100 or greater) body aches, chills, sore throat, or sudden loss of taste or sense of smell? . Have you been around anyone with known Covid 19. . Have you been around anyone who is awaiting Covid 19 test results in the past 7 to 10 days? . Have you been around anyone who has been exposed to Covid 19, or has mentioned symptoms of Covid 19 within the past 7 to 10 days?  If you have any concerns/questions about symptoms patients report during screening (either on the phone or at threshold). Contact the provider seeing the patient or DOD for further guidance.  If neither are available contact a member of the leadership team.       Patient returned my call, he answered no to all of the above questions

## 2019-03-20 NOTE — Telephone Encounter (Signed)
I called and left patient a message to return call for covid screening questions

## 2019-03-21 ENCOUNTER — Ambulatory Visit: Payer: PPO | Admitting: Cardiovascular Disease

## 2019-03-21 ENCOUNTER — Telehealth: Payer: Self-pay

## 2019-03-21 ENCOUNTER — Ambulatory Visit (HOSPITAL_COMMUNITY): Payer: PPO | Attending: Cardiology

## 2019-03-21 ENCOUNTER — Encounter: Payer: Self-pay | Admitting: Cardiovascular Disease

## 2019-03-21 VITALS — BP 128/82 | HR 70 | Ht 72.0 in | Wt 206.6 lb

## 2019-03-21 DIAGNOSIS — R079 Chest pain, unspecified: Secondary | ICD-10-CM | POA: Diagnosis not present

## 2019-03-21 DIAGNOSIS — E782 Mixed hyperlipidemia: Secondary | ICD-10-CM | POA: Diagnosis not present

## 2019-03-21 DIAGNOSIS — I77810 Thoracic aortic ectasia: Secondary | ICD-10-CM | POA: Diagnosis not present

## 2019-03-21 DIAGNOSIS — I700 Atherosclerosis of aorta: Secondary | ICD-10-CM | POA: Diagnosis not present

## 2019-03-21 DIAGNOSIS — D229 Melanocytic nevi, unspecified: Secondary | ICD-10-CM | POA: Diagnosis not present

## 2019-03-21 DIAGNOSIS — L570 Actinic keratosis: Secondary | ICD-10-CM | POA: Diagnosis not present

## 2019-03-21 DIAGNOSIS — L814 Other melanin hyperpigmentation: Secondary | ICD-10-CM | POA: Diagnosis not present

## 2019-03-21 DIAGNOSIS — L853 Xerosis cutis: Secondary | ICD-10-CM | POA: Diagnosis not present

## 2019-03-21 LAB — ECHOCARDIOGRAM COMPLETE
Height: 72 in
Weight: 3305.6 oz

## 2019-03-21 MED ORDER — ROSUVASTATIN CALCIUM 10 MG PO TABS: 10.0000 mg | ORAL_TABLET | Freq: Every day | ORAL | 3 refills | Status: DC

## 2019-03-21 NOTE — Progress Notes (Signed)
Cardiology Office Note:    Date:  03/21/2019   ID:  Kevin Day, DOB 05-12-1941, MRN [id number]  PCP:  Levin Erp, MD  Cardiologist:  No primary care provider on file.  Electrophysiologist:  None   Referring MD: Levin Erp, MD   Chief Complaint  Patient presents with   Chest Pain    History of Present Illness:    Kevin Day is a 78 y.o. male with a hx of aortic atherosclerosis and dilatation, presenting for evaluation of chest pain.  The patient is here alone today.  He was recently evaluated in the emergency department on March 10, 2019, at which time he underwent formal cardiology consultation by Dr. Radford Pax.  The patient presented with chest discomfort which has now been present for approximately 4 weeks.  He describes a mild discomfort that was initially located on the right side of his chest and now has moved into the central chest region.  The pain is intermittent and unpredictable.  It is there much of the time.  He rates it as a severity of 2/10 and describes it as a pressure-like sensation.  There is no radiation to the jaw, neck, or left arm.  There is no associated diaphoresis, lightheadedness, or syncope.  He does have mild shortness of breath with activity.  He denies orthopnea or PND.  He was started on Pepcid after his emergency department evaluation but this is not improved his symptoms.  The patient has no personal history of coronary artery disease.  He has no history of myocardial infarction or cardiac catheterization.  He has never had a diagnosis of congestive heart failure, TIA, or stroke.  He has been followed for a mildly dilated thoracic aorta which is been stable over time by serial CTA scans, last in 2019.  He had a Lexiscan Myoview stress test performed yesterday which showed no ischemia and demonstrated a normal gated LVEF of 50%.  Past Medical History:  Diagnosis Date   Arthritis    Cataracts, bilateral    Hypertension    Prostate cancer ([place])      Prostate cancer ([place])    Prostate cancer ([place])    Ruptured lumbar disc    Skin cancer     Past Surgical History:  Procedure Laterality Date   BACK SURGERY     cataract Bilateral    COLONOSCOPY     POLYPECTOMY     radioactive      radioactive seed implants for prostate CA.     Current Medications: Current Meds  Medication Sig   augmented betamethasone dipropionate (DIPROLENE-AF) 0.05 % cream Apply 1 application topically 2 (two) times daily as needed for rash. Face and groin   famotidine (PEPCID) 20 MG tablet Take 1 tablet (20 mg total) by mouth 2 (two) times daily.   Omega-3 Fatty Acids (FISH OIL PO) Take 1 capsule by mouth daily.    triamterene-hydrochlorothiazide (DYAZIDE) 37.5-25 MG capsule Take 1 capsule by mouth daily.   Current Facility-Administered Medications for the 03/21/19 encounter (Office Visit) with Sherren Mocha, MD  Medication   0.9 %  sodium chloride infusion   0.9 %  sodium chloride infusion     Allergies:   Patient has no known allergies.   Social History   Socioeconomic History   Marital status: Married    Spouse name: Not on file   Number of children: Not on file   Years of education: Not on file   Highest education level: Not on file  Occupational History   Not on file  Social Needs   Financial resource strain: Not on file   Food insecurity    Worry: Not on file    Inability: Not on file   Transportation needs    Medical: Not on file    Non-medical: Not on file  Tobacco Use   Smoking status: Former Smoker   Smokeless tobacco: Never Used  Substance and Sexual Activity   Alcohol use: No   Drug use: No   Sexual activity: Not on file  Lifestyle   Physical activity    Days per week: Not on file    Minutes per session: Not on file   Stress: Not on file  Relationships   Social connections    Talks on phone: Not on file    Gets together: Not on file    Attends religious service: Not on file    Active  member of club or organization: Not on file    Attends meetings of clubs or organizations: Not on file    Relationship status: Not on file  Other Topics Concern   Not on file  Social History Narrative   Not on file     Family History: The patient's family history includes Breast cancer in his sister. There is no history of Colon cancer, Esophageal cancer, Rectal cancer, or Stomach cancer.  ROS:   Please see the history of present illness.    All other systems reviewed and are negative.  EKGs/Labs/Other Studies Reviewed:    The following studies were reviewed today: Myoview stress test 03/20/2019: Study Highlights    Nuclear stress EF: 50%.  No T wave inversion was noted during stress.  There was no ST segment deviation noted during stress.  This is a low risk study.   No reversible ischemia. LVEF 50% with normal wall motion. This is a low risk study.    CTA Chest 05/01/2018: FINDINGS: Vascular Findings:  Re-demonstrated mild fusiform aneurysmal dilatation of the ascending thoracic aorta with measurements as follows. The thoracic aorta tapers to a normal caliber at the level of the aortic arch. No evidence of thoracic aortic dissection or periaortic stranding on this nongated examination.  Moderate amount of slightly irregular mixed calcified and noncalcified atherosclerotic plaque within the descending thoracic aorta, not resulting in a hemodynamically significant stenosis.  Bovine configuration of the aortic arch. The branch vessels of the aortic arch appear patent throughout their imaged course.  Normal heart size. Coronary artery calcifications. Calcification within the sub ependymal surface of the right ventricular apex is unchanged and potentially the sequela of prior infarction. No pericardial effusion.  Although this examination was not tailored for the evaluation the pulmonary arteries, there are no discrete filling defects within the central  pulmonary arterial tree to suggest central pulmonary embolism. Normal caliber of the main pulmonary artery.  -------------------------------------------------------------  Thoracic aortic measurements:  Sinotubular junction  39 mm as measured in greatest oblique coronal dimension.  Proximal ascending aorta  43 mm as measured in greatest oblique axial dimension at the level of the main pulmonary artery (image 52, series 4), and approximately 43 mm in greatest oblique short axis coronal diameter (coronal image 70, series 11), unchanged since the 07/2010 examination.  Aortic arch aorta  33 mm as measured in greatest oblique sagittal dimension.  Proximal descending thoracic aorta  28 mm as measured in greatest oblique axial dimension at the level of the main pulmonary artery.  Distal descending thoracic aorta  30 mm as  measured in greatest oblique axial dimension at the level of the diaphragmatic hiatus.  Review of the MIP images confirms the above findings.  -------------------------------------------------------------  Non-Vascular Findings:  Mediastinum/Lymph Nodes: No bulky mediastinal, hilar or axillary lymphadenopathy.  Lungs/Pleura: Mild apical predominant predominantly paraseptal emphysematous change. Minimal dependent subpleural ground-glass atelectasis. Grossly unchanged slightly asymmetric biapical pleuroparenchymal thickening, right greater than left, similar since at least the 08/2015 examination.  Punctate granulomas within the bilateral lower lobes (right image 100, series 6; left - images 77, 97 and 127) and left upper lobe (image 23), unchanged since at least the 08/2015 examination.  Grossly unchanged scattered bilateral punctate (sub 6 mm) nodules within the left lower (95, 107, 109 and 120, series 9), right lower (image 96) and right upper lobes (image 86) are grossly unchanged since at least the 08/2015  examination.  Re-demonstrated suspected approximately 0.7 cm suspected perifissural lymph node adjacent to the anterior aspect the right minor fissure (image 86, series 9, unchanged since at least the 08/2015 examination.  No definitive new or enlarging pulmonary nodules.  Upper abdomen: Limited early arterial phase evaluation of the upper abdomen demonstrates mild nodularity of the hepatic contour with diffuse decreased attenuation hepatic parenchyma on this postcontrast examination suggestive of hepatic steatosis. There is an ill-defined area of hyperenhancement involving the anterior segment of the right lobe of the liver measuring approximately 6 mm in diameter (image 107, series 4), which is too small to accurately characterize though appears similar to the 08/2015 examination and thus potentially representative of a flash filling hemangioma.  Musculoskeletal: No acute or aggressive osseous abnormalities. Stigmata of DISH within the lower thoracic spine. Heterogeneity of the anterior aspect of the thyroid incompletely evaluated though compatible with multinodular goiter demonstrated on remote thyroid ultrasound.  IMPRESSION: 1. Stable uncomplicated mild aneurysmal dilatation of the ascending thoracic aorta measuring 43 mm in diameter, unchanged compared to the 2011 examination. Aortic aneurysm NOS (ICD10-I71.9). 2. Coronary calcifications.  Aortic Atherosclerosis (ICD10-I70.0). 3.  Emphysema (ICD10-J43.9). 4. Scattered bilateral pulmonary nodules, several which appear calcified, appears similar since at least the 08/2015 examination. Stability for at least 2 years is indicative of benign etiology. 5. Similar findings suggestive of hepatic steatosis.  EKG:  EKG is not ordered today.  The ekg ordered 03-10-2019 demonstrates NSR, baseline wander, no abnormalities identified  Recent Labs: 03/10/2019: BUN 23; Creatinine, Ser 1.16; Hemoglobin 16.9; Platelets 165; Potassium  3.9; Sodium 139  Recent Lipid Panel No results found for: CHOL, TRIG, HDL, CHOLHDL, VLDL, LDLCALC, LDLDIRECT  Physical Exam:    VS:  BP 128/82    Pulse 70    Ht 6' (1.829 m)    Wt 206 lb 9.6 oz (93.7 kg)    SpO2 96%    BMI 28.02 kg/m     Wt Readings from Last 3 Encounters:  03/21/19 206 lb 9.6 oz (93.7 kg)  03/20/19 216 lb (98 kg)  03/10/19 204 lb (92.5 kg)     GEN:  Well nourished, well developed in no acute distress HEENT: Normal NECK: No JVD; No carotid bruits LYMPHATICS: No lymphadenopathy CARDIAC: RRR, no murmurs, rubs, gallops RESPIRATORY:  Clear to auscultation without rales, wheezing or rhonchi  ABDOMEN: Soft, non-tender, non-distended MUSCULOSKELETAL:  No edema; No deformity  SKIN: Warm and dry NEUROLOGIC:  Alert and oriented x 3 PSYCHIATRIC:  Normal affect   ASSESSMENT:    1. Chest pain, unspecified type   2. Hyperlipidemia, mixed   3. Aortic atherosclerosis ([place])   4. Dilated aortic root ([place])  PLAN:    In order of problems listed above:  1. The patient's nuclear scan is reassuring for absence of ischemia.  LV function is normal.  His high-sensitivity troponin from the emergency department was negative x2.  His EKG at the time of evaluation was also negative for any acute change.  I feel comfortable with his evaluation and I discussed potential causes of noncardiac chest pain.  I do think an echocardiogram should be performed to rule out any pericardial process or other noncoronary cause of chest pain.  Otherwise I would not anticipate any further cardiac evaluation at this time. 2. I reviewed the patient's lipids and his LDL cholesterol is greater than 160 mg/dL.  Considering the presence of incidental coronary calcification and aortic atherosclerosis seen on previous CT scans, I have recommended adding statin therapy.  The patient is willing to try rosuvastatin 10 mg daily with repeat lipids and LFTs in 8 to 12 weeks.  We reviewed the pros and cons of statin  therapy as well as potential side effects. 3. As above, will treat with low-dose aspirin 81 mg and addition of a statin drug. 4. Stable findings noted over many years of CT surveillance.    Medication Adjustments/Labs and Tests Ordered: Current medicines are reviewed at length with the patient today.  Concerns regarding medicines are outlined above.  Orders Placed This Encounter  Procedures   Lipid panel   Hepatic function panel   ECHOCARDIOGRAM COMPLETE   Meds ordered this encounter  Medications   rosuvastatin (CRESTOR) 10 MG tablet    Sig: Take 1 tablet (10 mg total) by mouth daily.    Dispense:  90 tablet    Refill:  3    Patient Instructions  Medication Instructions:  1) START CRESTOR 10 mg daily  Labwork: Your provider recommends that you return for lab work in 3 months. You may come ANY TIME between 7:30AM and 4:30PM on your scheduled day as long as you are fasting.  Testing/Procedures: Your provider has requested that you have an echocardiogram. Echocardiography is a painless test that uses sound waves to create images of your heart. It provides your doctor with information about the size and shape of your heart and how well your hearts chambers and valves are working. This procedure takes approximately one hour. There are no restrictions for this procedure.  Follow-Up: Your provider wants you to follow-up in: 1 year with Dr. Burt Knack. You will receive a reminder letter in the mail two months in advance. If you don't receive a letter, please call our office to schedule the follow-up appointment.    Any Other Special Instructions Will Be Listed Below (If Applicable).     If you need a refill on your cardiac medications before your next appointment, please call your pharmacy.      Signed, Sherren Mocha, MD  03/21/2019 12:31 PM    [place]

## 2019-03-21 NOTE — Telephone Encounter (Signed)
-----   Message from [place], [place] sent at 03/21/2019  3:29 PM EDT ----- Let pt known that his stress test is normal. No signs of poor blood flow to heart tissue. No signs of CAD. Pump function of heart is normal.

## 2019-03-21 NOTE — Patient Instructions (Signed)
Medication Instructions:  1) START CRESTOR 10 mg daily  Labwork: Your provider recommends that you return for lab work in 3 months. You may come ANY TIME between 7:30AM and 4:30PM on your scheduled day as long as you are fasting.  Testing/Procedures: Your provider has requested that you have an echocardiogram. Echocardiography is a painless test that uses sound waves to create images of your heart. It provides your doctor with information about the size and shape of your heart and how well your heart's chambers and valves are working. This procedure takes approximately one hour. There are no restrictions for this procedure.  Follow-Up: Your provider wants you to follow-up in: 1 year with Dr. Burt Knack. You will receive a reminder letter in the mail two months in advance. If you don't receive a letter, please call our office to schedule the follow-up appointment.    Any Other Special Instructions Will Be Listed Below (If Applicable).     If you need a refill on your cardiac medications before your next appointment, please call your pharmacy.

## 2019-03-21 NOTE — Telephone Encounter (Signed)
Notes recorded by Frederik Schmidt, RN on 03/21/2019 at 4:01 PM EDT  The patient has been notified of the result and verbalized understanding. All questions (if any) were answered.  Frederik Schmidt, RN 03/21/2019 4:01 PM

## 2019-03-25 ENCOUNTER — Encounter (HOSPITAL_COMMUNITY): Payer: PPO

## 2019-03-28 ENCOUNTER — Ambulatory Visit: Payer: PPO | Admitting: Cardiology

## 2019-04-02 DIAGNOSIS — K219 Gastro-esophageal reflux disease without esophagitis: Secondary | ICD-10-CM | POA: Diagnosis not present

## 2019-04-02 DIAGNOSIS — R079 Chest pain, unspecified: Secondary | ICD-10-CM | POA: Diagnosis not present

## 2019-04-25 DIAGNOSIS — H04123 Dry eye syndrome of bilateral lacrimal glands: Secondary | ICD-10-CM | POA: Diagnosis not present

## 2019-04-25 DIAGNOSIS — H43393 Other vitreous opacities, bilateral: Secondary | ICD-10-CM | POA: Diagnosis not present

## 2019-04-25 DIAGNOSIS — H02834 Dermatochalasis of left upper eyelid: Secondary | ICD-10-CM | POA: Diagnosis not present

## 2019-04-25 DIAGNOSIS — H33323 Round hole, bilateral: Secondary | ICD-10-CM | POA: Diagnosis not present

## 2019-04-25 DIAGNOSIS — H02831 Dermatochalasis of right upper eyelid: Secondary | ICD-10-CM | POA: Diagnosis not present

## 2019-05-29 ENCOUNTER — Other Ambulatory Visit: Payer: PPO

## 2019-05-30 ENCOUNTER — Other Ambulatory Visit: Payer: Self-pay

## 2019-05-30 ENCOUNTER — Other Ambulatory Visit: Payer: PPO | Admitting: *Deleted

## 2019-05-30 DIAGNOSIS — E782 Mixed hyperlipidemia: Secondary | ICD-10-CM | POA: Diagnosis not present

## 2019-05-30 DIAGNOSIS — Z23 Encounter for immunization: Secondary | ICD-10-CM | POA: Diagnosis not present

## 2019-05-30 LAB — HEPATIC FUNCTION PANEL
ALT: 18 IU/L (ref 0–44)
AST: 17 IU/L (ref 0–40)
Albumin: 4.3 g/dL (ref 3.7–4.7)
Alkaline Phosphatase: 68 IU/L (ref 39–117)
Bilirubin Total: 0.7 mg/dL (ref 0.0–1.2)
Bilirubin, Direct: 0.19 mg/dL (ref 0.00–0.40)
Total Protein: 7.4 g/dL (ref 6.0–8.5)

## 2019-05-30 LAB — LIPID PANEL
Chol/HDL Ratio: 3.6 ratio (ref 0.0–5.0)
Cholesterol, Total: 142 mg/dL (ref 100–199)
HDL: 39 mg/dL — ABNORMAL LOW (ref >39)
LDL Chol Calc (NIH): 77 mg/dL (ref 0–99)
Triglycerides: 151 mg/dL — ABNORMAL HIGH (ref 0–149)
VLDL Cholesterol Cal: 26 mg/dL (ref 5–40)

## 2019-06-09 DIAGNOSIS — L309 Dermatitis, unspecified: Secondary | ICD-10-CM | POA: Diagnosis not present

## 2019-06-09 DIAGNOSIS — B351 Tinea unguium: Secondary | ICD-10-CM | POA: Diagnosis not present

## 2019-06-09 DIAGNOSIS — L570 Actinic keratosis: Secondary | ICD-10-CM | POA: Diagnosis not present

## 2019-06-09 DIAGNOSIS — D229 Melanocytic nevi, unspecified: Secondary | ICD-10-CM | POA: Diagnosis not present

## 2019-07-01 ENCOUNTER — Other Ambulatory Visit: Payer: Self-pay

## 2019-07-01 DIAGNOSIS — Z20822 Contact with and (suspected) exposure to covid-19: Secondary | ICD-10-CM

## 2019-07-03 LAB — NOVEL CORONAVIRUS, NAA: SARS-CoV-2, NAA: NOT DETECTED

## 2019-07-03 LAB — SPECIMEN STATUS REPORT

## 2019-07-07 DIAGNOSIS — L570 Actinic keratosis: Secondary | ICD-10-CM | POA: Diagnosis not present

## 2019-07-07 DIAGNOSIS — L309 Dermatitis, unspecified: Secondary | ICD-10-CM | POA: Diagnosis not present

## 2019-08-08 DIAGNOSIS — X32XXXD Exposure to sunlight, subsequent encounter: Secondary | ICD-10-CM | POA: Diagnosis not present

## 2019-08-08 DIAGNOSIS — C4441 Basal cell carcinoma of skin of scalp and neck: Secondary | ICD-10-CM | POA: Diagnosis not present

## 2019-08-08 DIAGNOSIS — L570 Actinic keratosis: Secondary | ICD-10-CM | POA: Diagnosis not present

## 2019-08-08 DIAGNOSIS — B078 Other viral warts: Secondary | ICD-10-CM | POA: Diagnosis not present

## 2019-09-05 ENCOUNTER — Encounter: Payer: Self-pay | Admitting: Internal Medicine

## 2019-09-05 DIAGNOSIS — Z1331 Encounter for screening for depression: Secondary | ICD-10-CM | POA: Diagnosis not present

## 2019-09-05 DIAGNOSIS — E7849 Other hyperlipidemia: Secondary | ICD-10-CM | POA: Diagnosis not present

## 2019-09-05 DIAGNOSIS — I712 Thoracic aortic aneurysm, without rupture: Secondary | ICD-10-CM | POA: Diagnosis not present

## 2019-09-05 DIAGNOSIS — R21 Rash and other nonspecific skin eruption: Secondary | ICD-10-CM | POA: Diagnosis not present

## 2019-09-05 DIAGNOSIS — I10 Essential (primary) hypertension: Secondary | ICD-10-CM | POA: Diagnosis not present

## 2019-09-05 DIAGNOSIS — E041 Nontoxic single thyroid nodule: Secondary | ICD-10-CM | POA: Diagnosis not present

## 2019-09-05 DIAGNOSIS — C61 Malignant neoplasm of prostate: Secondary | ICD-10-CM | POA: Diagnosis not present

## 2019-09-05 DIAGNOSIS — K921 Melena: Secondary | ICD-10-CM | POA: Diagnosis not present

## 2019-09-05 DIAGNOSIS — R079 Chest pain, unspecified: Secondary | ICD-10-CM | POA: Diagnosis not present

## 2019-09-08 ENCOUNTER — Other Ambulatory Visit: Payer: Self-pay | Admitting: Internal Medicine

## 2019-09-08 DIAGNOSIS — I7121 Aneurysm of the ascending aorta, without rupture: Secondary | ICD-10-CM

## 2019-09-08 DIAGNOSIS — I712 Thoracic aortic aneurysm, without rupture: Secondary | ICD-10-CM

## 2019-09-18 ENCOUNTER — Other Ambulatory Visit: Payer: PPO

## 2019-09-23 ENCOUNTER — Other Ambulatory Visit: Payer: Self-pay

## 2019-09-23 DIAGNOSIS — H903 Sensorineural hearing loss, bilateral: Secondary | ICD-10-CM | POA: Diagnosis not present

## 2019-09-24 ENCOUNTER — Other Ambulatory Visit: Payer: Self-pay

## 2019-09-24 ENCOUNTER — Telehealth (HOSPITAL_BASED_OUTPATIENT_CLINIC_OR_DEPARTMENT_OTHER): Payer: Self-pay | Admitting: Family Medicine

## 2019-09-24 DIAGNOSIS — Z20822 Contact with and (suspected) exposure to covid-19: Secondary | ICD-10-CM

## 2019-09-24 NOTE — Telephone Encounter (Signed)
Covid-19 Screening

## 2019-09-25 ENCOUNTER — Ambulatory Visit: Payer: Medicare Other | Attending: Family Medicine | Admitting: Family Medicine

## 2019-09-25 ENCOUNTER — Other Ambulatory Visit: Payer: Self-pay

## 2019-09-25 ENCOUNTER — Encounter (HOSPITAL_BASED_OUTPATIENT_CLINIC_OR_DEPARTMENT_OTHER): Payer: Self-pay | Admitting: Family Medicine

## 2019-09-25 VITALS — Ht 75.0 in | Wt 190.0 lb

## 2019-09-25 DIAGNOSIS — N200 Calculus of kidney: Secondary | ICD-10-CM | POA: Diagnosis present

## 2019-09-25 DIAGNOSIS — E785 Hyperlipidemia, unspecified: Secondary | ICD-10-CM | POA: Diagnosis present

## 2019-09-25 DIAGNOSIS — M722 Plantar fascial fibromatosis: Secondary | ICD-10-CM | POA: Diagnosis present

## 2019-09-25 DIAGNOSIS — C439 Malignant melanoma of skin, unspecified: Secondary | ICD-10-CM | POA: Diagnosis present

## 2019-09-25 DIAGNOSIS — I10 Essential (primary) hypertension: Secondary | ICD-10-CM

## 2019-09-26 ENCOUNTER — Other Ambulatory Visit: Payer: Self-pay | Admitting: Pediatrics

## 2019-09-27 ENCOUNTER — Ambulatory Visit: Payer: Medicare Other | Attending: Internal Medicine

## 2019-09-27 DIAGNOSIS — Z23 Encounter for immunization: Secondary | ICD-10-CM | POA: Diagnosis present

## 2019-09-29 DIAGNOSIS — E785 Hyperlipidemia, unspecified: Secondary | ICD-10-CM | POA: Insufficient documentation

## 2019-09-29 DIAGNOSIS — N200 Calculus of kidney: Secondary | ICD-10-CM | POA: Insufficient documentation

## 2019-09-29 DIAGNOSIS — M722 Plantar fascial fibromatosis: Secondary | ICD-10-CM | POA: Insufficient documentation

## 2019-09-29 DIAGNOSIS — I10 Essential (primary) hypertension: Secondary | ICD-10-CM | POA: Insufficient documentation

## 2019-09-29 NOTE — Progress Notes (Signed)
Telemedicine Visit    S:    Eric Kemp is a 79 year old male who complains of the following:     New patient    Estimated body mass index is 23.75 kg/m as calculated from the following:       Height as of this encounter: 6\' 3"  (1.905 m).    Weight as of this encounter: 86.2 kg (190 lb).         No past medical history on file.          ROS: Per HPI. Denies headaches, weakness/falls, chest pain, shortness of breath, abdominal pain/nausea/vomiting, numbness/tingling, fevers or chills.     Medications, allergies, PMH and SH reviewed and updated as necessary in Epic.    O:  No breathlessness  Speaking in full sentences  Normal affect    A/P:  Eric Kemp is a 79 year old male presents with:    Visit spent reviewing PHX, SHx, lifestyle, HCM    1. Dyslipidemia  On meds  Recheck done recently     2. Melanoma of skin ([facility])  Needs referral   - REFERRAL TO DERMATOLOGY ( [facility])    3. Calculus of kidney  Hx of   Reviewed hydration    4. Essential hypertension  Recommend home BP cuff  Long term meds    5. Plantar fasciitis  Reviewed management        Lemar Livings, MD

## 2019-09-30 ENCOUNTER — Encounter: Payer: Self-pay | Admitting: Internal Medicine

## 2019-10-13 ENCOUNTER — Other Ambulatory Visit: Payer: Self-pay

## 2019-10-13 ENCOUNTER — Ambulatory Visit (AMBULATORY_SURGERY_CENTER): Payer: Self-pay

## 2019-10-13 VITALS — Temp 97.7°F | Ht 72.0 in | Wt 210.0 lb

## 2019-10-13 DIAGNOSIS — Z86010 Personal history of colonic polyps: Secondary | ICD-10-CM

## 2019-10-13 MED ORDER — NA SULFATE-K SULFATE-MG SULF 17.5-3.13-1.6 GM/177ML PO SOLN: 1.0000 | Freq: Once | ORAL | 0 refills | Status: AC

## 2019-10-13 NOTE — Progress Notes (Signed)

## 2019-10-14 ENCOUNTER — Encounter: Payer: Self-pay | Admitting: Internal Medicine

## 2019-10-25 ENCOUNTER — Other Ambulatory Visit: Payer: Self-pay

## 2019-10-25 ENCOUNTER — Ambulatory Visit: Payer: Medicare Other | Attending: Internal Medicine

## 2019-10-25 DIAGNOSIS — Z23 Encounter for immunization: Secondary | ICD-10-CM | POA: Diagnosis present

## 2019-10-27 ENCOUNTER — Other Ambulatory Visit: Payer: Self-pay

## 2019-10-27 ENCOUNTER — Encounter: Payer: Self-pay | Admitting: Internal Medicine

## 2019-10-27 ENCOUNTER — Ambulatory Visit (AMBULATORY_SURGERY_CENTER): Payer: PPO | Admitting: Internal Medicine

## 2019-10-27 VITALS — BP 123/77 | HR 61 | Temp 97.1°F | Resp 14 | Ht 72.0 in | Wt 210.0 lb

## 2019-10-27 DIAGNOSIS — D123 Benign neoplasm of transverse colon: Secondary | ICD-10-CM | POA: Diagnosis not present

## 2019-10-27 DIAGNOSIS — Z1211 Encounter for screening for malignant neoplasm of colon: Secondary | ICD-10-CM | POA: Diagnosis not present

## 2019-10-27 DIAGNOSIS — Z86010 Personal history of colonic polyps: Secondary | ICD-10-CM | POA: Diagnosis not present

## 2019-10-27 MED ORDER — SODIUM CHLORIDE 0.9 % IV SOLN: 500.0000 mL | Freq: Once | INTRAVENOUS | Status: DC

## 2019-10-27 NOTE — Progress Notes (Signed)
Pt's states no medical or surgical changes since previsit or office visit.  Temp- [place]

## 2019-10-27 NOTE — Op Note (Signed)
[facility] Patient Name: Kevin Day Procedure Date: 10/27/2019 11:19 AM MRN: [id number] Endoscopist: Jerene Bears , MD Age: 79 Referring MD:  Date of Birth: November 18, 1940 Gender: Male Account #: [id number] Procedure:                Colonoscopy Indications:              High risk colon cancer surveillance: Personal                            history of multiple (3 or more) adenomas, Last                            colonoscopy 3 years ago Medicines:                Monitored Anesthesia Care Procedure:                Pre-Anesthesia Assessment:                           - Prior to the procedure, a History and Physical                            was performed, and patient medications and                            allergies were reviewed. The patient's tolerance of                            previous anesthesia was also reviewed. The risks                            and benefits of the procedure and the sedation                            options and risks were discussed with the patient.                            All questions were answered, and informed consent                            was obtained. Prior Anticoagulants: The patient has                            taken no previous anticoagulant or antiplatelet                            agents. ASA Grade Assessment: II - A patient with                            mild systemic disease. After reviewing the risks                            and benefits, the patient was deemed in  satisfactory condition to undergo the procedure.                           After obtaining informed consent, the colonoscope                            was passed under direct vision. Throughout the                            procedure, the patient's blood pressure, pulse, and                            oxygen saturations were monitored continuously. The                            Colonoscope was introduced through the anus and                             advanced to the cecum, identified by appendiceal                            orifice and ileocecal valve. The colonoscopy was                            performed without difficulty. The patient tolerated                            the procedure well. The quality of the bowel                            preparation was good. The ileocecal valve,                            appendiceal orifice, and rectum were photographed. Scope In: 11:24:22 AM Scope Out: 11:40:43 AM Scope Withdrawal Time: 0 hours 12 minutes 47 seconds  Total Procedure Duration: 0 hours 16 minutes 21 seconds  Findings:                 The digital rectal exam was normal.                           Two sessile polyps were found in the transverse                            colon. The polyps were 3 to 4 mm in size. These                            polyps were removed with a cold snare. Resection                            and retrieval were complete.                           Multiple small and large-mouthed diverticula were  found in the sigmoid colon, descending colon and                            ascending colon.                           Very small patch of radiation proctitis seen in the                            distal rectum.                           Internal hemorrhoids were found during                            retroflexion. The hemorrhoids were small. Complications:            No immediate complications. Estimated Blood Loss:     Estimated blood loss was minimal. Impression:               - Two 3 to 4 mm polyps in the transverse colon,                            removed with a cold snare. Resected and retrieved.                           - Diverticulosis in the sigmoid colon, in the                            descending colon and in the ascending colon.                           - Small patch of radiation proctitis.                           - Small internal  hemorrhoids. Recommendation:           - Patient has a contact number available for                            emergencies. The signs and symptoms of potential                            delayed complications were discussed with the                            patient. Return to normal activities tomorrow.                            Written discharge instructions were provided to the                            patient.                           - Resume previous diet.                           -  Continue present medications.                           - Await pathology results.                           - No recommendation at this time regarding repeat                            colonoscopy due to age at next surveillance                            interval. Screening and surveillance colonoscopy                            generally stops at age 45, thus you will likely not                            need another colonoscopy for surveillance. Jerene Bears, MD 10/27/2019 11:45:18 AM This report has been signed electronically.

## 2019-10-27 NOTE — Progress Notes (Signed)
To PACU, VSS. Report to [email or web address] 

## 2019-10-27 NOTE — Patient Instructions (Signed)
HANDOUTS PROVIDED ON: POLYPS, DIVERTICULOSIS, & HEMORRHOIDS  The polyps removed taken today have been sent for pathology.  The results can take 1-3 weeks to receive.  When your next colonoscopy should occur will be based on the pathology results.    You may resume your previous diet and medication schedule.  Thank you for allowing [place] to care for you today!!!  YOU HAD AN ENDOSCOPIC PROCEDURE TODAY AT [facility]:   Refer to the procedure report that was given to you for any specific questions about what was found during the examination.  If the procedure report does not answer your questions, please call your gastroenterologist to clarify.  If you requested that your care partner not be given the details of your procedure findings, then the procedure report has been included in a sealed envelope for you to review at your convenience later.  YOU SHOULD EXPECT: Some feelings of bloating in the abdomen. Passage of more gas than usual.  Walking can help get rid of the air that was put into your GI tract during the procedure and reduce the bloating. If you had a lower endoscopy (such as a colonoscopy or flexible sigmoidoscopy) you may notice spotting of blood in your stool or on the toilet paper. If you underwent a bowel prep for your procedure, you may not have a normal bowel movement for a few days.  Please Note:  You might notice some irritation and congestion in your nose or some drainage.  This is from the oxygen used during your procedure.  There is no need for concern and it should clear up in a day or so.  SYMPTOMS TO REPORT IMMEDIATELY:   Following lower endoscopy (colonoscopy or flexible sigmoidoscopy):  Excessive amounts of blood in the stool  Significant tenderness or worsening of abdominal pains  Swelling of the abdomen that is new, acute  Fever of 100F or higher  For urgent or emergent issues, a gastroenterologist can be reached at any hour by calling [phone]  [phone].   DIET:  We do recommend a small meal at first, but then you may proceed to your regular diet.  Drink plenty of fluids but you should avoid alcoholic beverages for 24 hours.  ACTIVITY:  You should plan to take it easy for the rest of today and you should NOT DRIVE or use heavy machinery until tomorrow (because of the sedation medicines used during the test).    FOLLOW UP: Our staff will call the number listed on your records 48-72 hours following your procedure to check on you and address any questions or concerns that you may have regarding the information given to you following your procedure. If we do not reach you, we will leave a message.  We will attempt to reach you two times.  During this call, we will ask if you have developed any symptoms of COVID 19. If you develop any symptoms (ie: fever, flu-like symptoms, shortness of breath, cough etc.) before then, please call [phone].  If you test positive for Covid 19 in the 2 weeks post procedure, please call and report this information to [place].    If any biopsies were taken you will be contacted by phone or by letter within the next 1-3 weeks.  Please call us at [phone] if you have not heard about the biopsies in 3 weeks.    SIGNATURES/CONFIDENTIALITY: You and/or your care partner have signed paperwork which will be entered into your electronic medical record.  These  signatures attest to the fact that that the information above on your After Visit Summary has been reviewed and is understood.  Full responsibility of the confidentiality of this discharge information lies with you and/or your care-partner.

## 2019-10-29 ENCOUNTER — Telehealth: Payer: Self-pay | Admitting: *Deleted

## 2019-10-29 DIAGNOSIS — E7849 Other hyperlipidemia: Secondary | ICD-10-CM | POA: Diagnosis not present

## 2019-10-29 DIAGNOSIS — Z125 Encounter for screening for malignant neoplasm of prostate: Secondary | ICD-10-CM | POA: Diagnosis not present

## 2019-10-29 NOTE — Telephone Encounter (Signed)
  Follow up Call-  Call back number 10/27/2019 10/17/2017  Post procedure Call Back phone  # [phone]  Permission to leave phone message Yes Yes  Some recent data might be hidden     Patient questions:  Do you have a fever, pain , or abdominal swelling? No. Pain Score  0 *  Have you tolerated food without any problems? Yes.    Have you been able to return to your normal activities? Yes.    Do you have any questions about your discharge instructions: Diet   No. Medications  No. Follow up visit  No.  Do you have questions or concerns about your Care? No.  Actions: * If pain score is 4 or above: No action needed, pain <4.  1. Have you developed a fever since your procedure? no  2.   Have you had an respiratory symptoms (SOB or cough) since your procedure? no  3.   Have you tested positive for COVID 19 since your procedure no  4.   Have you had any family members/close contacts diagnosed with the COVID 19 since your procedure?  no   If yes to any of these questions please route to Joylene John, RN and Alphonsa Gin, Therapist, sports.

## 2019-10-30 ENCOUNTER — Encounter: Payer: Self-pay | Admitting: Internal Medicine

## 2019-11-11 DIAGNOSIS — L570 Actinic keratosis: Secondary | ICD-10-CM | POA: Diagnosis not present

## 2019-11-11 DIAGNOSIS — L578 Other skin changes due to chronic exposure to nonionizing radiation: Secondary | ICD-10-CM | POA: Diagnosis not present

## 2019-11-11 DIAGNOSIS — X32XXXD Exposure to sunlight, subsequent encounter: Secondary | ICD-10-CM | POA: Diagnosis not present

## 2019-11-11 DIAGNOSIS — C4441 Basal cell carcinoma of skin of scalp and neck: Secondary | ICD-10-CM | POA: Diagnosis not present

## 2019-11-12 DIAGNOSIS — Z1389 Encounter for screening for other disorder: Secondary | ICD-10-CM | POA: Diagnosis not present

## 2019-11-12 DIAGNOSIS — Z0000 Encounter for general adult medical examination without abnormal findings: Secondary | ICD-10-CM | POA: Diagnosis not present

## 2019-11-12 DIAGNOSIS — E7849 Other hyperlipidemia: Secondary | ICD-10-CM | POA: Diagnosis not present

## 2019-11-12 DIAGNOSIS — R079 Chest pain, unspecified: Secondary | ICD-10-CM | POA: Diagnosis not present

## 2019-11-12 DIAGNOSIS — I712 Thoracic aortic aneurysm, without rupture: Secondary | ICD-10-CM | POA: Diagnosis not present

## 2019-11-12 DIAGNOSIS — E041 Nontoxic single thyroid nodule: Secondary | ICD-10-CM | POA: Diagnosis not present

## 2019-11-12 DIAGNOSIS — C61 Malignant neoplasm of prostate: Secondary | ICD-10-CM | POA: Diagnosis not present

## 2019-11-12 DIAGNOSIS — I10 Essential (primary) hypertension: Secondary | ICD-10-CM | POA: Diagnosis not present

## 2019-11-12 DIAGNOSIS — R222 Localized swelling, mass and lump, trunk: Secondary | ICD-10-CM | POA: Diagnosis not present

## 2019-11-13 DIAGNOSIS — L209 Atopic dermatitis, unspecified: Secondary | ICD-10-CM | POA: Diagnosis not present

## 2019-11-14 ENCOUNTER — Ambulatory Visit (HOSPITAL_BASED_OUTPATIENT_CLINIC_OR_DEPARTMENT_OTHER): Payer: Medicare Other | Admitting: Dermatology

## 2019-12-22 ENCOUNTER — Ambulatory Visit
Admission: RE | Admit: 2019-12-22 | Discharge: 2019-12-22 | Disposition: A | Discharge status: Home / Self Care | Payer: PPO | Source: Ambulatory Visit | Attending: Internal Medicine | Admitting: Internal Medicine

## 2019-12-22 ENCOUNTER — Other Ambulatory Visit: Payer: Self-pay

## 2019-12-22 DIAGNOSIS — I712 Thoracic aortic aneurysm, without rupture: Secondary | ICD-10-CM | POA: Diagnosis not present

## 2019-12-22 DIAGNOSIS — I7121 Aneurysm of the ascending aorta, without rupture: Secondary | ICD-10-CM

## 2019-12-22 MED ORDER — IOPAMIDOL (ISOVUE-370) INJECTION 76%
75.0000 mL | Freq: Once | INTRAVENOUS | Status: AC | PRN
  Administered 2019-12-22: 09:00:00 75 mL via INTRAVENOUS

## 2019-12-23 DIAGNOSIS — Z08 Encounter for follow-up examination after completed treatment for malignant neoplasm: Secondary | ICD-10-CM | POA: Diagnosis not present

## 2019-12-23 DIAGNOSIS — C4441 Basal cell carcinoma of skin of scalp and neck: Secondary | ICD-10-CM | POA: Diagnosis not present

## 2019-12-23 DIAGNOSIS — X32XXXD Exposure to sunlight, subsequent encounter: Secondary | ICD-10-CM | POA: Diagnosis not present

## 2019-12-23 DIAGNOSIS — Z85828 Personal history of other malignant neoplasm of skin: Secondary | ICD-10-CM | POA: Diagnosis not present

## 2019-12-23 DIAGNOSIS — L570 Actinic keratosis: Secondary | ICD-10-CM | POA: Diagnosis not present

## 2020-01-02 ENCOUNTER — Ambulatory Visit: Payer: Medicare Other | Attending: Dermatology | Admitting: Dermatology

## 2020-01-02 ENCOUNTER — Other Ambulatory Visit: Payer: Self-pay

## 2020-01-02 DIAGNOSIS — Z85828 Personal history of other malignant neoplasm of skin: Secondary | ICD-10-CM

## 2020-01-02 DIAGNOSIS — L821 Other seborrheic keratosis: Secondary | ICD-10-CM | POA: Diagnosis present

## 2020-01-02 DIAGNOSIS — L219 Seborrheic dermatitis, unspecified: Secondary | ICD-10-CM | POA: Diagnosis not present

## 2020-01-02 DIAGNOSIS — D225 Melanocytic nevi of trunk: Secondary | ICD-10-CM

## 2020-01-02 DIAGNOSIS — Z85820 Personal history of malignant melanoma of skin: Secondary | ICD-10-CM

## 2020-01-02 DIAGNOSIS — Z1283 Encounter for screening for malignant neoplasm of skin: Secondary | ICD-10-CM | POA: Diagnosis present

## 2020-01-02 MED ORDER — KETOCONAZOLE 2 % EX CREA
TOPICAL_CREAM | Freq: Two times a day (BID) | CUTANEOUS | 2 refills | Status: DC
Start: 2020-01-02 — End: 2021-10-10

## 2020-01-02 NOTE — Patient Instructions (Signed)
Please obtain records of melanoma and squamous cell carcinoma history from dermatologist at [facility].      Please perform regular self-skin exams, and notify your doctor if you notice lesions that are asymmetric, have irregular borders, color changes, growing in diameter, or are otherwise concerning to you. It is also recommended that you regularly wear SPF 30 or greater sunscreen and/or sun protective clothing when outdoors, wearing sunscreen has been shown to reduce the risk of skin cancers.    A - Asymmetry  B - Border  C - Color  D - Diameter  E - Evolution       ABCDEs of Melanoma   These basic guidelines are used by many dermatologists to help identify melanoma, the deadliest form of skin cancer.   Catching melanoma early could mean the difference between life and a life-threatening cancer. Knowing what to look for and performing regular self-skin exams may help you become more aware of unusual spots that should be brought to the attention of a dermatologist.   If you notice an unusual spot or a spot that has one or more of these characteristics, make an appointment with a dermatologist - preferably one who has experience with melanoma.   A - Asymmetrical Shape   Melanoma lesions are often irregular, or not symmetrical, in shape. Benign moles are usually symmetrical.    B - Border   Typically, non-cancerous moles have smooth, even borders. Melanoma lesions usually have irregular borders that are difficult to define.    C - Color   The presence of more than one color (blue, black, brown, tan, etc.) or the uneven distribution of color can sometimes be a warning sign of melanoma. Benign moles are usually a single shade of brown or tan.    D - Diameter   Melanoma lesions are often greater than 6 millimeters in diameter (about the size of a pencil eraser).    E E - Evolution (or Change)   The evolution of your mole(s) has become the most important factor to consider when it comes to diagnosing a melanoma. Knowing  what is normal for YOU could save your life.        Sunscreens    Eric Kemp protection is an important part in decreasing our risk of skin cancers, protecting against age spots, and minimizing wrinkles. We can actively participate in sun protection by avoiding excessive sun exposure and applying sunscreens.    We recommend daily use of sunscreens to face and tops of hands. With outdoor activities, sunscreens are recommended to exposed body areas. When applying sunscreen, be sure to use a full ounce (handful amount) to entire body 15-20 minutes prior to sun exposure. Reapply every 2 hours depending on your activity. Excessive sweating or activities which involve water require more frequent applications.    We recommend sunscreens which have both UVA and UVB protection (broad spectrum). Sun Protection factor (SPF) is a measurement of the UVB blockade. A minimum SPF of 30 is advised. When purchasing a sunscreen, look for a product which contains one of the following active ingredients: Avobenzone, Titanium Dioxide, or Zinc Oxide.    Newer products, such as Aveeno and Neutrogena, contain stabilizers which allow sunscreens to work longer on your skin. Mexoryl is another common stabilizer which is used in many cosmetic products on the market.    Suggested Sunscreens        *   Neutrogena Sunblock with Helioplex Stabilizer*        *  Aveeno Continuous Protection with Active Photobarrier Complex Stabilizer*        *   CeraVe Facial Sunscreen SPF 30*        *   Cetaphil Facial Sunscreen SPF 30*        *   Coppertone Shade Lotion*        *   Eucerin Everyday Protection SPF 30*        *   Vanicream Sunscreen+    ?    For those with sensitive skin, we recommend using a product with just a physical blocker such as titanium dioxide or zinc oxide. These can be found in Neutrogena Sensitive Skin Sunblock, Vanicream Sunscreen Sensitive Skin, Blue Lizard Sensitive Skin and EltaMD UV Physical Sunscreen.    * Available at [facility], [facility],  [facility] and [employer]    + Available online or at specialty pharmacies

## 2020-01-02 NOTE — Progress Notes (Addendum)
Eric Kemp is a 79 year old male patient here for TBSE, hx of 2 melanomatous lesions    HPI:   Eric Kemp is a very pleasant 79 year old male who presents for a screening total body skin examination.     Here for melanoma f/u. Previously had skin checks q6 months, last skin check in [place] June 2020.     Sometimes has pink itchy rash on sides of nose and brows. X years. Not treating.    Denies having any other new, changing, itching, painful, bleeding, non-healing lesions. No other skin complaints. No new or changing moles.    Has been wearing long sleeves and rarely outdoors. Previously spend a lot of time outdoors. SPF 50-70 regularly. Had been living in [place] for 25 years, previously [place].      No other dermatologic complaints or concerns today.    Pt reports a personal past dermatologic history of: Melanoma on back, treated at [facility], s/p WLE. No SLN was done. 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    Current Medications:  Current Outpatient Medications   Medication Sig    amlodipine-atorvastatin (CADUET) 5-10 MG per tablet Take 1 tablet by mouth daily     No current facility-administered medications for this visit.       Allergies:  Review of Patient's Allergies indicates:  Not on [facility] Problem List:  Active Problems:    * No active hospital problems. *      Vitals:  Extended Vitals not filed for this encounter.    Physical Exam  Gen: NAD, alert and oriented, pleasant  A total body skin examination was performed including the scalp, face, head, neck, trunk, extremities, mucous membranes, and groin. Genital exam was deferred today. Notable findings include:  - low density of brown well-demarcated round 2-43mm macules and thin papules with uniform symmetric pigment on trunk and extremities. None with gross clinical abnormalities or concerning dermatoscopic  features.  - well-healed scars x3 on R upper back, R mid back, L mid back, no nodularity/ induration, pigment changes  - well-healed scar on R and L dorsal hand, no nodularity/ induration, piiment changes  - thin waxy tan papules scattered on trunk c/w SK  - flaky pink patches on L nasal alar crease and brows  - No cervical, supraclavicular, axillary LAD    Assessment:  Very pleasant 79 year old male who presents for a screening total body skin examination.      1. Seborrheic keratosis  - Reassured that this is a benign lesion. No need for treatment.     2. Seborrheic dermatitis:  - For scalp: Start ketoconazole shampoo 2-3 times weekly. Leave on for >5 minutes then rinse  - For face: Start ketoconazole cream 1-2 times daily to affected areas.    3. History of melanoma:  - Melanoma, back 2020 ([facility], [place]) pt to obtain records. Of note, there are 3 biopsies on back and pt is not sure which was melanoma. NERD for all sites.   - The importance of sun protection was discussed along with best methods (including clothing, sunscreens, avoidance of peak UV index hours). The alarming signs of pigmented and non-pigmented (including nonhealing ulcers and scars without injury) skin lesions were reviewed. Self skin monitoring advised, and  pt encouraged to report any suspicious lesions immediately.  - Counseled that he stay up to date with dental and ophthalmic exams.    Melanoma quality documentation: y  Melanoma staging: awaiting records  Next screening skin examination scheduled: 6 mos  EPIC reminder message sent: yes  Pt added to dermatology melanoma list: yes    4. Melanocytic nevi: All nevi benign appearing and similar to one another on exam. The importance of sun protection was discussed along with best methods (including clothing, sunscreens, avoidance of peak UV index hours). The alarming signs of pigmented (including ABCDEs) and non-pigmented (including nonhealing ulcers and scars without injury) skin lesions were  reviewed. Self skin monitoring advised, and pt encouraged to report any suspicious lesions immediately.    5. Screening skin examination  - No other lesions grossly concerning for MM or NMSC  - Discussed sun protective measures with patient including regular use of sunscreen with SPF 30 or greater and use of sun protective clothing  - Reviewed ABCDEs of melanoma and told patient that should she develop any lesions of concern she should notify her primary care doctor or could call our office     6. History of SCC, R and L dorsal hand (4-5 years ago)  - NERD  - pt to obtain records from [facility]    RTC 6 mos TBSE, sooner prn      Darnell Level, MD  01/02/2020

## 2020-02-03 DIAGNOSIS — X32XXXD Exposure to sunlight, subsequent encounter: Secondary | ICD-10-CM | POA: Diagnosis not present

## 2020-02-03 DIAGNOSIS — Z85828 Personal history of other malignant neoplasm of skin: Secondary | ICD-10-CM | POA: Diagnosis not present

## 2020-02-03 DIAGNOSIS — Z08 Encounter for follow-up examination after completed treatment for malignant neoplasm: Secondary | ICD-10-CM | POA: Diagnosis not present

## 2020-02-03 DIAGNOSIS — L570 Actinic keratosis: Secondary | ICD-10-CM | POA: Diagnosis not present

## 2020-02-03 DIAGNOSIS — L928 Other granulomatous disorders of the skin and subcutaneous tissue: Secondary | ICD-10-CM | POA: Diagnosis not present

## 2020-02-17 ENCOUNTER — Other Ambulatory Visit: Payer: Self-pay | Admitting: Cardiovascular Disease

## 2020-03-02 DIAGNOSIS — Z08 Encounter for follow-up examination after completed treatment for malignant neoplasm: Secondary | ICD-10-CM | POA: Diagnosis not present

## 2020-03-02 DIAGNOSIS — X32XXXD Exposure to sunlight, subsequent encounter: Secondary | ICD-10-CM | POA: Diagnosis not present

## 2020-03-02 DIAGNOSIS — Z85828 Personal history of other malignant neoplasm of skin: Secondary | ICD-10-CM | POA: Diagnosis not present

## 2020-03-02 DIAGNOSIS — L570 Actinic keratosis: Secondary | ICD-10-CM | POA: Diagnosis not present

## 2020-03-02 DIAGNOSIS — D0461 Carcinoma in situ of skin of right upper limb, including shoulder: Secondary | ICD-10-CM | POA: Diagnosis not present

## 2020-03-02 DIAGNOSIS — D0462 Carcinoma in situ of skin of left upper limb, including shoulder: Secondary | ICD-10-CM | POA: Diagnosis not present

## 2020-03-22 DIAGNOSIS — T148XXA Other injury of unspecified body region, initial encounter: Secondary | ICD-10-CM | POA: Diagnosis not present

## 2020-03-22 DIAGNOSIS — M7981 Nontraumatic hematoma of soft tissue: Secondary | ICD-10-CM | POA: Diagnosis not present

## 2020-05-12 DIAGNOSIS — G4700 Insomnia, unspecified: Secondary | ICD-10-CM | POA: Diagnosis not present

## 2020-05-12 DIAGNOSIS — I10 Essential (primary) hypertension: Secondary | ICD-10-CM | POA: Diagnosis not present

## 2020-05-12 DIAGNOSIS — M170 Bilateral primary osteoarthritis of knee: Secondary | ICD-10-CM | POA: Diagnosis not present

## 2020-05-20 ENCOUNTER — Other Ambulatory Visit: Payer: Self-pay | Admitting: Cardiovascular Disease

## 2020-07-05 DIAGNOSIS — H01022 Squamous blepharitis right lower eyelid: Secondary | ICD-10-CM | POA: Diagnosis not present

## 2020-07-05 DIAGNOSIS — H353122 Nonexudative age-related macular degeneration, left eye, intermediate dry stage: Secondary | ICD-10-CM | POA: Diagnosis not present

## 2020-07-05 DIAGNOSIS — H353111 Nonexudative age-related macular degeneration, right eye, early dry stage: Secondary | ICD-10-CM | POA: Diagnosis not present

## 2020-07-05 DIAGNOSIS — H01025 Squamous blepharitis left lower eyelid: Secondary | ICD-10-CM | POA: Diagnosis not present

## 2020-07-06 ENCOUNTER — Ambulatory Visit (HOSPITAL_BASED_OUTPATIENT_CLINIC_OR_DEPARTMENT_OTHER): Payer: Medicare Other | Admitting: Dermatology

## 2020-07-06 NOTE — Progress Notes (Signed)
Medically appropriate hx:  Pt is 79 yo M w/ hx of melanoma and SCC who presents today for f/u and TBSE, last seen in dermatology clinic 6 months ago by Dr. Ashley Mariner. Since that time he notes no new lesions of concern, he has no new dermatologic complaints today.    Pt reports a personal past dermatologic history of: Melanoma on back, treated at [facility], s/p WLE. No SLN was done. 3 biopsies done on back totally and unclear which one was melanoma  - R and L dorsal hand : SCC, 4-5 years ago ([facility])     No recent fevers, chills, night sweats, weight loss, nausea, vomiting, diarrhea, headaches, cough, shortness of breath, arthralgias, myalgias    SH: [place]oved from [place] to be closer to family. Laurence Aly, is a Curator. Looking to [place] a home in [place] [facility]    Medically appropriate exam:  A total body skin examination was performed including the scalp, face, head, neck, trunk, extremities, mucous membranes, and groin. Genital exam was deferred today. Notable findings include:  - low density of brown well-demarcated round 2-23mm macules and thin papules with uniform symmetric pigment on trunk and extremities. None with gross clinical abnormalities or concerning dermatoscopic features.  - L neck with 3-60mm pink scaly papule (suspected AK, treated today, recheck next visit)  - well-healed scars x3 on R upper back, R mid back, L mid back, no nodularity/ induration, pigment changes  - well-healed scar on R and L dorsal hand, no nodularity/ induration, piiment changes  - thin waxy tan papules scattered on trunk c/w SK  - flaky pink patches on L nasal alar crease and brows  - No cervical, supraclavicular, axillary LAD    Assessment/Plan/Data Review:    79 year old gentleman with:    Hx of Skin cancer  - NER at dorsal hands and back sites x 3  - Discussed sun protective measures with patient including regular use of sunscreen with SPF 30 or greater and use of sun protective clothing  - Reviewed ABCDEs of  melanoma  - No lesions grossly concerning for MM or NMSC on exam today  - Pt to RTC 6 months sooner PRN  year for any concerning lesions     Actinic keratoses  - Treated 1 AKs  with liquid nitrogen cryotherapy today  - Side effects including erythema, tenderness, blistering discussed    Melanoma quality documentation:  Melanoma staging: unknown, records to be obtained  Next screening skin examination scheduled: YES  01/05/21  EPIC reminder message sent: YES  Pt added to dermatology melanoma list: YES    RTC 6 months sooner PRN

## 2020-07-07 ENCOUNTER — Encounter (HOSPITAL_BASED_OUTPATIENT_CLINIC_OR_DEPARTMENT_OTHER): Payer: Self-pay | Admitting: Dermatology

## 2020-07-07 ENCOUNTER — Other Ambulatory Visit: Payer: Self-pay

## 2020-07-07 ENCOUNTER — Ambulatory Visit: Payer: Medicare Other | Attending: Dermatology | Admitting: Dermatology

## 2020-07-07 DIAGNOSIS — Z85820 Personal history of malignant melanoma of skin: Secondary | ICD-10-CM | POA: Diagnosis present

## 2020-07-07 DIAGNOSIS — Z1283 Encounter for screening for malignant neoplasm of skin: Secondary | ICD-10-CM | POA: Diagnosis not present

## 2020-07-07 DIAGNOSIS — Z8589 Personal history of malignant neoplasm of other organs and systems: Secondary | ICD-10-CM | POA: Insufficient documentation

## 2020-07-07 DIAGNOSIS — L570 Actinic keratosis: Secondary | ICD-10-CM | POA: Insufficient documentation

## 2020-07-07 NOTE — Procedures (Deleted)
Lessie Dings, MD - 06/30/2016  Formatting of this note might be different from the original.  EXAMINATION:  X-RAY FOOT LEFT 3 OR MORE VIEWS    DATE/TIME:  06/30/2016 1:19 PM    HISTORY: Pain.    COMPARISON:  02/07/2016.    FINDINGS:    Hammertoe deformity of the second through fifth toes. Superior and inferior calcaneal enthesophytes. Moderate osteoarthrosis of the first MTP joint. No fracture dislocation.    IMPRESSION:  IMPRESSIO N:   1. Hammertoe deformity of the second through fifth toes.  2. Moderate first MTP joint osteoarthrosis.  3. Calcaneal enthesopathy.        CC Dr:       Mamie Nick:     f:

## 2020-07-07 NOTE — Procedures (Deleted)
Kathyrn Lass, MD - 02/07/2016  Formatting of this note might be different from the original.  EXAMINATION:  X-RAY FOOT LEFT 1 OR 2 VIEWS    DATE/TIME:  02/07/2016 12:06 PM    HISTORY:  Plantar fasciitis    COMPARISON:  None.    TECHNIQUE: Standard 3 views of the left foot, nonweightbearing    FINDINGS:  A tiny, smoothly marginated enthesophyte is present at the plantar margin of the calcaneal tuberosity. Possible mild, plantar s oft tissue swelling at the hindfoot.    A larger smoothly marginated enthesophyte/spur just over 1 cm in length is present at the posterior margin with possible minimal adjacent Achilles tendon thickening. A transverse radiolucent defect is present near the base of the spur, probably old.    Study was performed without weightbearing, but there could be hammertoe deformity at one or more digits. No significant bunion deformity or hallux  valgus. Joint spaces are preserved.. No significant deformity or obvious focal bone lesion. No posttraumatic findings.    IMPRESSION:  IMPRESSION:   1. Questionable mild plantar soft tissue swelling at the hindfoot, with negligible plantar heel spur formation.  2. Possible old fracture with minimal diastases at the base of the posterior calcaneal spur.  Questionable mild adjacent Achilles tendon thickening.        CC Dr:       Mamie Nick:     f:

## 2020-07-07 NOTE — Procedures (Deleted)
Mare Ferrari, MD - 12/12/2017  Formatting of this note might be different from the original.  EXAMINATION:  X-RAY ABDOMEN 1 VIEW    DATE/TIME:  12/12/2017 2:29 PM    HISTORY:  N20.0 - Calculus of kidney - I10    COMPARISON:  CT of the abdomen/pelvis dated 03/23/2012; KUB dated 05/18/2017.    FINDINGS:    Query 2 mm left mid/upper pole renal calculus. No additional renal/urinary tract calculi.    Right greater than left pelvic phle boliths.    Mild to moderate bilateral hip osteoarthrosis. Moderate degenerative changes of the lower lumbar spine.    IMPRESSION:  IMPRESSION:    Query 2 mm left mid/upper pole renal calculus.    No additional renal/urinary tract calculi.

## 2020-07-07 NOTE — Procedures (Deleted)
Eric Grosser, MD - 03/03/2019  Formatting of this note might be different from the original.  EXAMINATION:  X-RAY ABDOMEN 1 VIEW    DATE/TIME:  03/03/2019 9:05 AM    HISTORY:  kid stones    FINDINGS:    Comparison 12/12/2017.    No urinary tract calculi are identified.  Pelvic phleboliths are stable.  Mild bilateral hip joint osteoarthrosis, stable.    IMPRESSION:  IMPRESSION:    Prior study showed equivocal left-sided 2 mm renal ston e.  This is no longer identified.  Pelvic phleboliths are stable.

## 2020-07-07 NOTE — Procedures (Deleted)
Marcella Dubs, MD - 03/28/2017  Formatting of this note might be different from the original.  EXAMINATION:  CT ABDOMEN AND PELVIS WITHOUT IV CONTRAST    DATE/TIME:  03/23/2017 6:25 PM    HISTORY:  Abdominal pain.     COMPARISON:  None.    TECHNIQUE: CT examination was performed without intravenous contrast.  Abdomen/Pelvis.  In accordance with CT protocols and the ALARA principle, radiation dose reduction techniques were utilized for t his examination.    FINDINGS:  Bibasilar atelectasis.    The liver has a normal noncontrast appearance.    The gallbladder is unremarkable.    The pancreas has a normal noncontrast appearance.    The spleen has a normal noncontrast appearance.    The adrenal glands are unremarkable.    A 7 mm calculus is identified within the proximal left ureter at the level of the ureterovesicular junction. Multiple additional tiny bilateral nonobstru ctive renal calculi are present. The urinary bladder, prostate gland, and seminal vesicles are unremarkable.    No significant abdominal or pelvic lymphadenopathy.    No abdominal aortic ectasia.    Small hiatal hernia. The stomach is otherwise normal in appearance. Small bowel is normal in caliber.    The appendix is normal.    The colon and rectum are unremarkable.    No ascites, pneumatosis, or pneumoperitoneum.    No suspicious lyti c or sclerotic osseous lesion.    Fat-containing bilateral inguinal hernias left greater than right.  No evidence of bowel involvement or entrapment.    IMPRESSION:  IMPRESSION:   1. 7 mm calculus within the left ureter at the level of the ureterovesicular junction. Mild hydronephrosis.  2. Multiple additional tiny bilateral nonobstructive renal calculi.  3. Other chronic findings as described.    EXPOSURE:   This patient received a tot al of 1 exposure event(s) during this CT examination as follows:  Exposure Event: 1;  Scan/Series: 2;  Anatomic Area: Abdomen;  Phantom: 32 cm;  CTDIvol (mGy): 13;  DLP (mGy-cm): 736

## 2020-07-07 NOTE — Procedures (Deleted)
Perry Mount, MD - 05/01/2017  Formatting of this note might be different from the original.  EXAMINATION:  X-RAY ABDOMEN 1 VIEW    DATE/TIME:  05/01/2017 1:16 PM    HISTORY:  Kidney stone    COMPARISON:  03/23/2017 CT    FINDINGS:  Interval migration of the calculus now within the mid ureter lateral to L4 vertebral body just superior to L5 transverse process.    Other pelvic phleboliths noted.    Nonspecific bowel gas pattern. Degener ative changes to the spine and hips.      IMPRESSION:  IMPRESSION:  Interval minimal migration of the left ureteral calculus now at the mid ureter lateral to L4 level.        cc:      p:     f:

## 2020-07-07 NOTE — Procedures (Deleted)
Austin Miles, MD - 09/10/2017  Formatting of this note might be different from the original.  EXAMINATION:  X-RAY CHEST 2 VIEWS WITH PA/AP AND LATERAL    DATE/TIME:  09/10/2017 9:21 AM    HISTORY:  R/O PNEUMONIA- PLS WET READ    COMPARISON:  None.    FINDINGS:    No focal consolidation. Mild bibasilar atelectasis.     There is normal pulmonary vascularity. There is no pleural effusion or pneumothorax.      The cardiomediastinal s ilhouette is unremarkable. The osseous structures demonstrate no acute abnormality.    IMPRESSION:  IMPRESSION:   No focal consolidation. Mild bibasilar atelectasis.    CC Dr: Mamie Nick: f:

## 2020-07-07 NOTE — Procedures (Deleted)
McCreight, Bing Neighbors, MD - 05/18/2017  Formatting of this note might be different from the original.  EXAMINATION:  X-RAY ABDOMEN 1 VIEW    DATE/TIME:  05/18/2017 11:04 AM    HISTORY:  Kidney stones.    COMPARISON:  05/01/2017 AP supine abdominal-pelvic radiographs.    FINDINGS:    2 AP supine radiographs incorporate abdomen and pelvis.  BOWEL GAS PATTERN:  Normal.  No abnormal dilation or deviation.    CALCIFICATIONS:    No evidenc e of urinary tract stones. Prior 7 mm stone left ureter just below the L4 left transverse process no longer evident. Ongoing pelvic phleboliths.  OTHER:  Negative.  No abnormal gaseous collections.      IMPRESSION:  IMPRESSION:    1. No evidence of urinary tract stones.      CC Dr: Mamie Nick: f:

## 2020-07-15 ENCOUNTER — Other Ambulatory Visit: Payer: Self-pay | Admitting: Cardiovascular Disease

## 2020-07-16 DIAGNOSIS — X32XXXD Exposure to sunlight, subsequent encounter: Secondary | ICD-10-CM | POA: Diagnosis not present

## 2020-07-16 DIAGNOSIS — C44311 Basal cell carcinoma of skin of nose: Secondary | ICD-10-CM | POA: Diagnosis not present

## 2020-07-16 DIAGNOSIS — L570 Actinic keratosis: Secondary | ICD-10-CM | POA: Diagnosis not present

## 2020-08-07 ENCOUNTER — Other Ambulatory Visit: Payer: Self-pay | Admitting: Cardiovascular Disease

## 2020-08-24 DIAGNOSIS — C44311 Basal cell carcinoma of skin of nose: Secondary | ICD-10-CM | POA: Diagnosis not present

## 2020-08-24 DIAGNOSIS — D0461 Carcinoma in situ of skin of right upper limb, including shoulder: Secondary | ICD-10-CM | POA: Diagnosis not present

## 2020-08-24 DIAGNOSIS — C44329 Squamous cell carcinoma of skin of other parts of face: Secondary | ICD-10-CM | POA: Diagnosis not present

## 2020-09-28 DIAGNOSIS — X32XXXD Exposure to sunlight, subsequent encounter: Secondary | ICD-10-CM | POA: Diagnosis not present

## 2020-09-28 DIAGNOSIS — C44311 Basal cell carcinoma of skin of nose: Secondary | ICD-10-CM | POA: Diagnosis not present

## 2020-09-28 DIAGNOSIS — Z08 Encounter for follow-up examination after completed treatment for malignant neoplasm: Secondary | ICD-10-CM | POA: Diagnosis not present

## 2020-09-28 DIAGNOSIS — Z85828 Personal history of other malignant neoplasm of skin: Secondary | ICD-10-CM | POA: Diagnosis not present

## 2020-09-28 DIAGNOSIS — L570 Actinic keratosis: Secondary | ICD-10-CM | POA: Diagnosis not present

## 2020-09-28 DIAGNOSIS — C44622 Squamous cell carcinoma of skin of right upper limb, including shoulder: Secondary | ICD-10-CM | POA: Diagnosis not present

## 2020-10-15 DIAGNOSIS — H33313 Horseshoe tear of retina without detachment, bilateral: Secondary | ICD-10-CM | POA: Diagnosis not present

## 2020-10-15 DIAGNOSIS — H35372 Puckering of macula, left eye: Secondary | ICD-10-CM | POA: Diagnosis not present

## 2020-10-15 DIAGNOSIS — H40023 Open angle with borderline findings, high risk, bilateral: Secondary | ICD-10-CM | POA: Diagnosis not present

## 2020-11-12 ENCOUNTER — Other Ambulatory Visit (HOSPITAL_BASED_OUTPATIENT_CLINIC_OR_DEPARTMENT_OTHER): Payer: Self-pay | Admitting: Family Medicine

## 2020-11-12 DIAGNOSIS — E785 Hyperlipidemia, unspecified: Secondary | ICD-10-CM | POA: Diagnosis not present

## 2020-11-12 DIAGNOSIS — Z125 Encounter for screening for malignant neoplasm of prostate: Secondary | ICD-10-CM | POA: Diagnosis not present

## 2020-11-12 DIAGNOSIS — E041 Nontoxic single thyroid nodule: Secondary | ICD-10-CM | POA: Diagnosis not present

## 2020-11-12 DIAGNOSIS — H9190 Unspecified hearing loss, unspecified ear: Secondary | ICD-10-CM

## 2020-11-12 MED ORDER — AMLODIPINE-ATORVASTATIN 5-10 MG PO TABS
1.0000 | ORAL_TABLET | Freq: Every day | ORAL | 3 refills | Status: DC
Start: 2020-11-12 — End: 2021-09-19

## 2020-11-12 NOTE — Telephone Encounter (Signed)
PER Patient (self), Eric Kemp is a 80 year old male has requested a refill of caduet.      Last Office Visit: 09/25/2019 with pcp  Last Physical Exam: n/a    There are no preventive care reminders to display for this patient.    Other Med Adult:  Most Recent BP Reading(s)  No data found for BP        No results found for: CHOLESTEROL  No results found for: LDL  No results found for: HDL  No results found for: TG      No results found for: TSHSC      No results found for: TSH    No results found for: HGBA1C    No results found for: POCA1C      No results found for: INR    No results found for: NA    No results found for: K        No results found for: CREAT    Documented patient preferred pharmacies:    [place], [place]  Phone: [phone] Fax: [phone]

## 2020-11-19 ENCOUNTER — Other Ambulatory Visit: Payer: Self-pay | Admitting: Internal Medicine

## 2020-11-19 DIAGNOSIS — R12 Heartburn: Secondary | ICD-10-CM | POA: Diagnosis not present

## 2020-11-19 DIAGNOSIS — R82998 Other abnormal findings in urine: Secondary | ICD-10-CM | POA: Diagnosis not present

## 2020-11-19 DIAGNOSIS — M170 Bilateral primary osteoarthritis of knee: Secondary | ICD-10-CM | POA: Diagnosis not present

## 2020-11-19 DIAGNOSIS — G4700 Insomnia, unspecified: Secondary | ICD-10-CM | POA: Diagnosis not present

## 2020-11-19 DIAGNOSIS — E876 Hypokalemia: Secondary | ICD-10-CM | POA: Diagnosis not present

## 2020-11-19 DIAGNOSIS — E041 Nontoxic single thyroid nodule: Secondary | ICD-10-CM | POA: Diagnosis not present

## 2020-11-19 DIAGNOSIS — E785 Hyperlipidemia, unspecified: Secondary | ICD-10-CM | POA: Diagnosis not present

## 2020-11-19 DIAGNOSIS — R7301 Impaired fasting glucose: Secondary | ICD-10-CM | POA: Diagnosis not present

## 2020-11-19 DIAGNOSIS — Z0000 Encounter for general adult medical examination without abnormal findings: Secondary | ICD-10-CM | POA: Diagnosis not present

## 2020-11-19 DIAGNOSIS — I712 Thoracic aortic aneurysm, without rupture, unspecified: Secondary | ICD-10-CM

## 2020-11-19 DIAGNOSIS — Z1212 Encounter for screening for malignant neoplasm of rectum: Secondary | ICD-10-CM | POA: Diagnosis not present

## 2020-11-19 DIAGNOSIS — I10 Essential (primary) hypertension: Secondary | ICD-10-CM | POA: Diagnosis not present

## 2020-11-19 DIAGNOSIS — Z1331 Encounter for screening for depression: Secondary | ICD-10-CM | POA: Diagnosis not present

## 2020-11-19 DIAGNOSIS — Z1389 Encounter for screening for other disorder: Secondary | ICD-10-CM | POA: Diagnosis not present

## 2020-11-19 DIAGNOSIS — K921 Melena: Secondary | ICD-10-CM | POA: Diagnosis not present

## 2020-11-19 DIAGNOSIS — C61 Malignant neoplasm of prostate: Secondary | ICD-10-CM | POA: Diagnosis not present

## 2020-11-22 ENCOUNTER — Other Ambulatory Visit: Payer: Self-pay

## 2020-11-22 ENCOUNTER — Ambulatory Visit (HOSPITAL_BASED_OUTPATIENT_CLINIC_OR_DEPARTMENT_OTHER): Payer: Medicare Other | Admitting: Audiologist-Hearing Aid Fitter

## 2020-11-22 ENCOUNTER — Encounter (HOSPITAL_BASED_OUTPATIENT_CLINIC_OR_DEPARTMENT_OTHER): Payer: Self-pay | Admitting: Otolaryngology

## 2020-11-22 ENCOUNTER — Ambulatory Visit: Payer: Medicare Other | Attending: Otolaryngology | Admitting: Otolaryngology

## 2020-11-22 DIAGNOSIS — H903 Sensorineural hearing loss, bilateral: Secondary | ICD-10-CM | POA: Insufficient documentation

## 2020-11-22 NOTE — Progress Notes (Signed)
HPI: 80 year old male Eric Kemp complaints of B hearing loss worse over the past 2 yrs. Symptom is constant without fluctuation. Today there is No otorrhea, No otalgia, No vertigo.    H/o chronic ear infections: No  Past ear surgery: No  Otologic FH: No     History reviewed. No pertinent past medical history.    Review of Patient's Allergies indicates:  No Known Allergies    amlodipine-atorvastatin (CADUET) 5-10 MG per tablet, Take 1 tablet by mouth daily, Disp: , Rfl:   aspirin 81 MG EC tablet, Take 81 mg by mouth daily, Disp: , Rfl:   amlodipine-atorvastatin (CADUET) 5-10 MG per tablet, Take 1 tablet by mouth daily, Disp: 90 tablet, Rfl: 3    No current facility-administered medications on file prior to visit.      Social and family history reviewed either in EPIC or with patient and are not contributory unless specifically noted in HPI.    Physical Exam  There were no vitals filed for this visit.    Constitutional: normal.  Facial lesions: No.  Ears: Right: Obstructing cerumen:No. EAC dry and is open. TM normal.    Left:  Obstructing cerumen:No. EAC dry and is open. TM normal.   Facial Nerve: normal One out of 6 House-Brackmann scale bilaterally.  Sinus: Pain No.  Neck: Soft. Trachea midline. No palpable masses.  Thyroid: Normal to palpation.  Lymph:  No palpable LN in the neck.  Salivary glands:  Normal size, non-tender.  Communication: Voice normal.    Eyes: EOMI. Nystagmus No  Respiratory drive spontaneous and comfortable.  Neuro: normal.  Mood/Psych: normal.    Audio from [employer] on 10/12/20 was/were interpreted independently. It showed B symmetric SNHL from moderate to severe degree above 1.5 kHz. Discrim was asymmetric with 60% in the right and 80% in the left at 65 dB level.     I independently interpreted the patient's audiogram with hearing/speech thresholds and tympanometry that were ordered by me today and agree with audiologist's findings and recommendation.    B discrim was above 90% at 75 dB level.      A/P  80 year old male with B SNHL. Recommend hearing aids. Medical clearance offered today. RTC in a yr with repeat audio.    This record was generated using voice recognition software. I proof-read and corrected any voice recognitions errors found. Please excuse any remaining voice recognition errors which were not detected.

## 2020-11-22 NOTE — Progress Notes (Signed)
History:  Eric Kemp is a 80 year old male who reports he had a hearing evaluation at [facility] that showed bilateral hearing loss with asymmetric WRS, poorer in the right ear.     Impressions:  WRS Excellent in both ears when presented at 75 dBHL using NU 6 list 1A and 2A.     Recommendation:  Re-evaluation upon Physician's request

## 2020-11-23 DIAGNOSIS — Z08 Encounter for follow-up examination after completed treatment for malignant neoplasm: Secondary | ICD-10-CM | POA: Diagnosis not present

## 2020-11-23 DIAGNOSIS — Z85828 Personal history of other malignant neoplasm of skin: Secondary | ICD-10-CM | POA: Diagnosis not present

## 2020-11-30 DIAGNOSIS — Z23 Encounter for immunization: Secondary | ICD-10-CM | POA: Diagnosis not present

## 2020-12-06 ENCOUNTER — Ambulatory Visit
Admission: RE | Admit: 2020-12-06 | Discharge: 2020-12-06 | Disposition: A | Discharge status: Home / Self Care | Payer: PPO | Source: Ambulatory Visit | Attending: Internal Medicine | Admitting: Internal Medicine

## 2020-12-06 DIAGNOSIS — I712 Thoracic aortic aneurysm, without rupture, unspecified: Secondary | ICD-10-CM

## 2020-12-06 DIAGNOSIS — I358 Other nonrheumatic aortic valve disorders: Secondary | ICD-10-CM | POA: Diagnosis not present

## 2020-12-06 DIAGNOSIS — I2510 Atherosclerotic heart disease of native coronary artery without angina pectoris: Secondary | ICD-10-CM | POA: Diagnosis not present

## 2020-12-06 DIAGNOSIS — J929 Pleural plaque without asbestos: Secondary | ICD-10-CM | POA: Diagnosis not present

## 2020-12-06 MED ORDER — IOPAMIDOL (ISOVUE-370) INJECTION 76%
75.0000 mL | Freq: Once | INTRAVENOUS | Status: AC | PRN
  Administered 2020-12-06: 75 mL via INTRAVENOUS

## 2020-12-07 ENCOUNTER — Other Ambulatory Visit: Payer: PPO

## 2020-12-08 DIAGNOSIS — M1712 Unilateral primary osteoarthritis, left knee: Secondary | ICD-10-CM | POA: Diagnosis not present

## 2020-12-08 DIAGNOSIS — M1711 Unilateral primary osteoarthritis, right knee: Secondary | ICD-10-CM | POA: Diagnosis not present

## 2020-12-08 DIAGNOSIS — M170 Bilateral primary osteoarthritis of knee: Secondary | ICD-10-CM | POA: Diagnosis not present

## 2020-12-14 DIAGNOSIS — Z85828 Personal history of other malignant neoplasm of skin: Secondary | ICD-10-CM | POA: Diagnosis not present

## 2020-12-14 DIAGNOSIS — Z08 Encounter for follow-up examination after completed treatment for malignant neoplasm: Secondary | ICD-10-CM | POA: Diagnosis not present

## 2021-01-05 ENCOUNTER — Ambulatory Visit (HOSPITAL_BASED_OUTPATIENT_CLINIC_OR_DEPARTMENT_OTHER): Payer: Medicare Other | Admitting: Dermatology

## 2021-01-05 NOTE — Progress Notes (Signed)
Medically appropriate hx:  Pt is 80 yo M w/ hx of melanoma and SCC who presents today for f/u and TBSE, last seen in dermatology clinic 6 months ago by me.     Since that time he noted a lesion on the abdomen which is somewhat itchy, but has since been less itchy.  No other new lesions of concern, he has no new dermatologic complaints today.    Pt reports a personal past dermatologic history [id number] on back, treated at [place], s/p WLE. No SLN was done. 3 biopsies done on back totally and unclear which one was melanoma  - R and L dorsal hand : SCC, 4-5 years ago ([facility])    No recent fevers, chills, night sweats, weight loss, nausea, vomiting, diarrhea, headaches, cough, shortness of breath, arthralgias, myalgias    SH: Moved from [facility], to be closer to family. Laurence Aly, is a Curator. Recently purchased a house in [place], [place] a trip to [place] this summer    Medically appropriate exam:  A total body skin examination was performed including the scalp, face, head, neck, trunk, extremities, mucous membranes, and groin. Genital exam was deferred today. Notable findings include:  AOX3, NAD, pleasant  -lowdensity of brown well-demarcated round 2-71mm macules and thin papules with uniform symmetric pigment on trunk and extremities. None with gross clinical abnormalities or concerning dermatoscopic features.  - L neck clear, no evident scaly papule (was watch site)  -well-healed scars x3on R upper back, R mid back, L mid back,no nodularity/ induration, pigment changes  - Central abdomen with 42mm pink and brown scaly papule (LOC, SK)  -well-healed scar onR and L dorsal hand, no nodularity/ induration, piiment changes  - thin waxy tan papules scattered on trunk c/w SK  - flaky pink patches on L nasal alar crease and brows  -No cervical, supraclavicular, axillary LAD    Assessment/Plan/Data Review:    80 year old gentleman with:    Hx of Skin cancer  - NER at dorsal hands  and back sites x 3  - Discussed sun protective measures with patient including regular use of sunscreen with SPF 30 or greater and use of sun protective clothing  - Reviewed ABCDEs of melanoma  - No lesions grossly concerning for MM or NMSC on exam today  - Pt to RTC 6 months sooner PRN  year for any concerning lesions     Lesion of concern on abdomen  - This is a seborrheic keratosis, reassured Pt      Melanoma quality documentation:  Melanoma staging: unknown, records to be obtained  Next screening skin examination scheduled: YES  07/13/21  EPIC reminder message sent: YES  Pt added to dermatology melanoma list: YES    RTC 6 months sooner PRN

## 2021-01-06 ENCOUNTER — Other Ambulatory Visit: Payer: Self-pay

## 2021-01-06 ENCOUNTER — Encounter (HOSPITAL_BASED_OUTPATIENT_CLINIC_OR_DEPARTMENT_OTHER): Payer: Self-pay | Admitting: Dermatology

## 2021-01-06 ENCOUNTER — Ambulatory Visit: Payer: Medicare Other | Attending: Dermatology | Admitting: Dermatology

## 2021-01-06 DIAGNOSIS — Z1283 Encounter for screening for malignant neoplasm of skin: Secondary | ICD-10-CM | POA: Diagnosis present

## 2021-01-06 DIAGNOSIS — L821 Other seborrheic keratosis: Secondary | ICD-10-CM | POA: Diagnosis present

## 2021-01-06 DIAGNOSIS — Z8589 Personal history of malignant neoplasm of other organs and systems: Secondary | ICD-10-CM | POA: Diagnosis present

## 2021-01-06 DIAGNOSIS — Z85820 Personal history of malignant melanoma of skin: Secondary | ICD-10-CM | POA: Diagnosis not present

## 2021-01-07 DIAGNOSIS — H401131 Primary open-angle glaucoma, bilateral, mild stage: Secondary | ICD-10-CM | POA: Diagnosis not present

## 2021-01-14 DIAGNOSIS — U071 COVID-19: Secondary | ICD-10-CM | POA: Diagnosis not present

## 2021-03-04 DIAGNOSIS — H401131 Primary open-angle glaucoma, bilateral, mild stage: Secondary | ICD-10-CM | POA: Diagnosis not present

## 2021-03-17 DIAGNOSIS — H353122 Nonexudative age-related macular degeneration, left eye, intermediate dry stage: Secondary | ICD-10-CM | POA: Diagnosis not present

## 2021-04-01 ENCOUNTER — Encounter (HOSPITAL_COMMUNITY): Payer: Self-pay | Admitting: Pharmacy Technician

## 2021-04-01 ENCOUNTER — Emergency Department (HOSPITAL_COMMUNITY): Payer: PPO

## 2021-04-01 ENCOUNTER — Other Ambulatory Visit: Payer: Self-pay

## 2021-04-01 ENCOUNTER — Emergency Department (HOSPITAL_COMMUNITY)
Admission: EM | Admit: 2021-04-01 | Discharge: 2021-04-02 | Disposition: A | Discharge status: Home / Self Care | Payer: PPO | Attending: Emergency Medicine | Admitting: Emergency Medicine

## 2021-04-01 DIAGNOSIS — Z7982 Long term (current) use of aspirin: Secondary | ICD-10-CM | POA: Diagnosis not present

## 2021-04-01 DIAGNOSIS — Z79899 Other long term (current) drug therapy: Secondary | ICD-10-CM | POA: Diagnosis not present

## 2021-04-01 DIAGNOSIS — I4891 Unspecified atrial fibrillation: Secondary | ICD-10-CM

## 2021-04-01 DIAGNOSIS — E876 Hypokalemia: Secondary | ICD-10-CM | POA: Diagnosis not present

## 2021-04-01 DIAGNOSIS — R0602 Shortness of breath: Secondary | ICD-10-CM | POA: Diagnosis not present

## 2021-04-01 DIAGNOSIS — R072 Precordial pain: Secondary | ICD-10-CM

## 2021-04-01 DIAGNOSIS — Z87891 Personal history of nicotine dependence: Secondary | ICD-10-CM | POA: Insufficient documentation

## 2021-04-01 DIAGNOSIS — R079 Chest pain, unspecified: Secondary | ICD-10-CM | POA: Diagnosis not present

## 2021-04-01 DIAGNOSIS — Z85828 Personal history of other malignant neoplasm of skin: Secondary | ICD-10-CM | POA: Insufficient documentation

## 2021-04-01 DIAGNOSIS — I10 Essential (primary) hypertension: Secondary | ICD-10-CM | POA: Insufficient documentation

## 2021-04-01 DIAGNOSIS — Z8546 Personal history of malignant neoplasm of prostate: Secondary | ICD-10-CM | POA: Insufficient documentation

## 2021-04-01 DIAGNOSIS — Z20822 Contact with and (suspected) exposure to covid-19: Secondary | ICD-10-CM | POA: Insufficient documentation

## 2021-04-01 LAB — RESP PANEL BY RT-PCR (FLU A&B, COVID) ARPGX2
Influenza A by PCR: NEGATIVE
Influenza B by PCR: NEGATIVE
SARS Coronavirus 2 by RT PCR: NEGATIVE

## 2021-04-01 LAB — CBC WITH DIFFERENTIAL/PLATELET
Abs Immature Granulocytes: 0.03 10*3/uL (ref 0.00–0.07)
Basophils Absolute: 0.1 10*3/uL (ref 0.0–0.1)
Basophils Relative: 1 %
Eosinophils Absolute: 0.1 10*3/uL (ref 0.0–0.5)
Eosinophils Relative: 1 %
HCT: 42.3 % (ref 39.0–52.0)
Hemoglobin: 14.9 g/dL (ref 13.0–17.0)
Immature Granulocytes: 0 %
Lymphocytes Relative: 22 %
Lymphs Abs: 2 10*3/uL (ref 0.7–4.0)
MCH: 32.1 pg (ref 26.0–34.0)
MCHC: 35.2 g/dL (ref 30.0–36.0)
MCV: 91.2 fL (ref 80.0–100.0)
Monocytes Absolute: 0.9 10*3/uL (ref 0.1–1.0)
Monocytes Relative: 10 %
Neutro Abs: 6 10*3/uL (ref 1.7–7.7)
Neutrophils Relative %: 66 %
Platelets: 177 10*3/uL (ref 150–400)
RBC: 4.64 MIL/uL (ref 4.22–5.81)
RDW: 13.4 % (ref 11.5–15.5)
WBC: 9 10*3/uL (ref 4.0–10.5)
nRBC: 0 % (ref 0.0–0.2)

## 2021-04-01 LAB — COMPREHENSIVE METABOLIC PANEL
ALT: 16 U/L (ref 0–44)
AST: 16 U/L (ref 15–41)
Albumin: 3.8 g/dL (ref 3.5–5.0)
Alkaline Phosphatase: 73 U/L (ref 38–126)
Anion gap: 13 (ref 5–15)
BUN: 24 mg/dL — ABNORMAL HIGH (ref 8–23)
CO2: 22 mmol/L (ref 22–32)
Calcium: 9.1 mg/dL (ref 8.9–10.3)
Chloride: 103 mmol/L (ref 98–111)
Creatinine, Ser: 1.42 mg/dL — ABNORMAL HIGH (ref 0.61–1.24)
GFR, Estimated: 50 mL/min — ABNORMAL LOW (ref >60)
Glucose, Bld: 159 mg/dL — ABNORMAL HIGH (ref 70–99)
Potassium: 2.9 mmol/L — ABNORMAL LOW (ref 3.5–5.1)
Sodium: 138 mmol/L (ref 135–145)
Total Bilirubin: 0.8 mg/dL (ref 0.3–1.2)
Total Protein: 7.6 g/dL (ref 6.5–8.1)

## 2021-04-01 LAB — BRAIN NATRIURETIC PEPTIDE: B Natriuretic Peptide: 132 pg/mL — ABNORMAL HIGH (ref 0.0–100.0)

## 2021-04-01 LAB — TROPONIN I (HIGH SENSITIVITY): Troponin I (High Sensitivity): 13 ng/L (ref <18)

## 2021-04-01 MED ORDER — POTASSIUM CHLORIDE CRYS ER 20 MEQ PO TBCR
40.0000 meq | EXTENDED_RELEASE_TABLET | Freq: Once | ORAL | Status: AC
  Administered 2021-04-01: 40 meq via ORAL
  Filled 2021-04-01: qty 2

## 2021-04-01 MED ORDER — IOHEXOL 350 MG/ML SOLN
75.0000 mL | Freq: Once | INTRAVENOUS | Status: AC | PRN
  Administered 2021-04-01: 56 mL via INTRAVENOUS

## 2021-04-01 MED ORDER — ASPIRIN 81 MG PO CHEW
324.0000 mg | CHEWABLE_TABLET | Freq: Once | ORAL | Status: AC
  Administered 2021-04-01: 324 mg via ORAL
  Filled 2021-04-01: qty 4

## 2021-04-01 MED ORDER — NITROGLYCERIN 0.4 MG SL SUBL
0.4000 mg | SUBLINGUAL_TABLET | SUBLINGUAL | Status: DC | PRN
  Administered 2021-04-01: 0.4 mg via SUBLINGUAL
  Filled 2021-04-01: qty 1

## 2021-04-01 NOTE — ED Provider Notes (Signed)
Emergency Medicine Provider Triage Evaluation Note  Kevin Day , a 80 y.o. male  was evaluated in triage.  Pt complains of chest pain and shortness of breath.  Chest pain and shortness of breath started 3 days prior.  Chest pain is midsternal and does not radiate.  Patient describes pain as a "heartburn."  Patient states that shortness of breath has gotten progressively worse since it started.  Patient states that today his watch told him that he has elevated heart rate.  Denies any history of atrial fibrillation.  Patient is on any anticoagulation  Review of Systems  Positive: Chest pain, shortness of breath, bilateral leg swelling Negative: Palpitations, abdominal pain, nausea, vomiting, back pain  Physical Exam  BP (!) 154/82   Pulse 94   Temp 98.4 F (36.9 C) (Oral)   Resp 14   SpO2 96%  Gen:   Awake, no distress   Resp:  Normal effort, lungs clear to auscultation bilaterally MSK:   Moves extremities without difficulty, +1 edema to bilateral lower extremities, no tenderness to bilateral lower extremities Other:  Heart rate irregularly irregular  Medical Decision Making  Medically screening exam initiated at 3:05 PM.  Appropriate orders placed.  Kevin Day was informed that the remainder of the evaluation will be completed by another provider, this initial triage assessment does not replace that evaluation, and the importance of remaining in the ED until their evaluation is complete.  Patient is an atrial fibrillation, will have injection next available and he was   Kevin Day 04/01/21 1507    Kevin Sorrow, MD 04/01/21 2131

## 2021-04-01 NOTE — ED Provider Notes (Signed)
[facility] EMERGENCY DEPARTMENT Provider Note   CSN: [id number] Arrival date & time: 04/01/21  1440     History Chief Complaint  Patient presents with   Chest Pain   Shortness of Breath    Kevin Day is a 80 y.o. male.  Patient with a complaint of left anterior chest tightness for 3 days.  But that tightness did get worse this morning at about 0 900.  It is associated with shortness of breath feels like it is hard to catch his breath.  No diaphoresis no nausea no vomiting.  EKG on presentation showed atrial fibrillation.  Patient does not have a history of atrial fibrillation.  Past medical history sniffing for hypertension prostate cancer and hyperlipidemia.  Patient also had bilateral leg swelling.      Past Medical History:  Diagnosis Date   Arthritis    Cataracts, bilateral    Hyperlipidemia    Hypertension    Prostate cancer ([place])    Prostate cancer [facility])    Prostate cancer ([place])    Ruptured lumbar disc    Skin cancer     Patient Active Problem List   Diagnosis Date Noted   Chest pain     Past Surgical History:  Procedure Laterality Date   BACK SURGERY     cataract Bilateral    COLONOSCOPY  10/09/2016   POLYPECTOMY     radioactive      radioactive seed implants for prostate CA.        Family History  Problem Relation Age of Onset   Breast cancer Sister    Colon cancer Neg Hx    Esophageal cancer Neg Hx    Rectal cancer Neg Hx    Stomach cancer Neg Hx    Colon polyps Neg Hx     Social History   Tobacco Use   Smoking status: Former   Smokeless tobacco: Never  Scientific laboratory technician Use: Never used  Substance Use Topics   Alcohol use: No   Drug use: No    Home Medications Prior to Admission medications   Medication Sig Start Date End Date Taking? Authorizing Provider  Ascorbic Acid (VITAMIN C PO) Take by mouth.    [provider]  ASPIRIN PO Take by mouth.    [provider]  augmented betamethasone  dipropionate (DIPROLENE-AF) 0.05 % cream Apply 1 application topically 2 (two) times daily as needed for rash. Face and groin 02/19/19   [provider]  Cholecalciferol (VITAMIN D3 PO) Take by mouth.    [provider]  famotidine (PEPCID) 20 MG tablet Take 1 tablet (20 mg total) by mouth 2 (two) times daily. Patient not taking: Reported on 10/13/2019 03/10/19   Valarie Merino, MD  Glucosamine HCl (GLUCOSAMINE PO) Take by mouth.    [provider]  Multiple Vitamins-Minerals (OCUVITE PO) Take by mouth.    [provider]  Multiple Vitamins-Minerals (ZINC PO) Take by mouth.    [provider]  Omega-3 Fatty Acids (FISH OIL PO) Take 1 capsule by mouth daily.     [provider]  rosuvastatin (CRESTOR) 10 MG tablet Take 1 tablet (10 mg total) by mouth daily. Please make overdue appt with Dr. Burt Knack before anymore refills.2nd attempt 07/15/20   Sherren Mocha, MD  triamterene-hydrochlorothiazide (DYAZIDE) 37.5-25 MG capsule Take 1 capsule by mouth daily.    [provider]    Allergies    Patient has no known allergies.  Review  of Systems   Review of Systems  Constitutional:  Negative for chills and fever.  HENT:  Negative for ear pain and sore throat.   Eyes:  Negative for pain and visual disturbance.  Respiratory:  Positive for shortness of breath. Negative for cough.   Cardiovascular:  Positive for chest pain and leg swelling. Negative for palpitations.  Gastrointestinal:  Negative for abdominal pain and vomiting.  Genitourinary:  Negative for dysuria and hematuria.  Musculoskeletal:  Negative for arthralgias and back pain.  Skin:  Negative for color change and rash.  Neurological:  Negative for seizures and syncope.  All other systems reviewed and are negative.  Physical Exam Updated Vital Signs BP 137/80   Pulse 78   Temp 98.4 F (36.9 C) (Oral)   Resp (!) 21   SpO2 99%   Physical Exam Vitals and nursing note  reviewed.  Constitutional:      General: He is not in acute distress.    Appearance: Normal appearance. He is well-developed.  HENT:     Head: Normocephalic and atraumatic.  Eyes:     Extraocular Movements: Extraocular movements intact.     Conjunctiva/sclera: Conjunctivae normal.     Pupils: Pupils are equal, round, and reactive to light.  Cardiovascular:     Rate and Rhythm: Normal rate. Rhythm irregular.     Heart sounds: No murmur heard. Pulmonary:     Effort: Pulmonary effort is normal. No respiratory distress.     Breath sounds: Normal breath sounds.  Chest:     Chest wall: No tenderness.  Abdominal:     Palpations: Abdomen is soft.     Tenderness: There is no abdominal tenderness.  Musculoskeletal:        General: Swelling present.     Cervical back: Normal range of motion and neck supple.     Right lower leg: Edema present.     Left lower leg: Edema present.  Skin:    General: Skin is warm and dry.     Capillary Refill: Capillary refill takes less than 2 seconds.  Neurological:     General: No focal deficit present.     Mental Status: He is alert and oriented to person, place, and time.     Cranial Nerves: No cranial nerve deficit.     Sensory: No sensory deficit.     Motor: No weakness.    ED Results / Procedures / Treatments   Labs (all labs ordered are listed, but only abnormal results are displayed) Labs Reviewed  COMPREHENSIVE METABOLIC PANEL - Abnormal; Notable for the following components:      Result Value   Potassium 2.9 (*)    Glucose, Bld 159 (*)    BUN 24 (*)    Creatinine, Ser 1.42 (*)    GFR, Estimated 50 (*)    All other components within normal limits  BRAIN NATRIURETIC PEPTIDE - Abnormal; Notable for the following components:   B Natriuretic Peptide 132.0 (*)    All other components within normal limits  CBC WITH DIFFERENTIAL/PLATELET  TROPONIN I (HIGH SENSITIVITY)    EKG EKG Interpretation  Date/Time:  Friday April 01 2021 14:51:05  EDT Ventricular Rate:  106 PR Interval:    QRS Duration: 104 QT Interval:  360 QTC Calculation: 478 R Axis:   -13 Text Interpretation: Atrial fibrillation with rapid ventricular response with premature ventricular or aberrantly conducted complexes Nonspecific ST abnormality Abnormal ECG Confirmed by [name] [phone]) on 04/01/2021 9:30:26 PM  Radiology DG Chest  2 View  Result Date: 04/01/2021 CLINICAL DATA:  Chest pain, shortness of breath EXAM: CHEST - 2 VIEW COMPARISON:  Chest radiographs 03/10/2019, CT a chest 12/06/2020 FINDINGS: The cardiomediastinal silhouette is within normal limits. Mild tortuosity of the thoracic aorta is unchanged. There is no focal consolidation or pulmonary edema. There is no pleural effusion or pneumothorax. There is no acute osseous abnormality. IMPRESSION: No radiographic evidence of acute cardiopulmonary process. Electronically Signed   By: Valetta Mole MD   On: 04/01/2021 15:47    Procedures Procedures   Medications Ordered in ED Medications  aspirin chewable tablet 324 mg (has no administration in time range)  nitroGLYCERIN (NITROSTAT) SL tablet 0.4 mg (has no administration in time range)  potassium chloride SA (KLOR-CON) CR tablet 40 mEq (has no administration in time range)    ED Course  I have reviewed the triage vital signs and the nursing notes.  Pertinent labs & imaging results that were available during my care of the patient were reviewed by me and considered in my medical decision making (see chart for details).    MDM Rules/Calculators/A&P                           Patient symptoms concerning for possible acute coronary syndrome.  Will need troponins.  Cardiac monitoring here shows atrial fibrillation rate controlled.  Initial EKG heart rate was in the low 100s.  Patient also has some shortness of breath feeling.  Bilateral lower extremity swelling.  No fever no cough.  Will get CT angio to rule out pulmonary embolus and to get a  better look at the lungs.  Patient's potassium here is 2.9.  GFR is 50.  We will give 40 mEq potassium p.o. for hypokalemia.  Chest x-ray without any acute findings.  CBC was normal.  BNP slightly elevated at 132.  CT angio chest without any acute findings.  No evidence of any pulmonary embolus.  Troponin still pending.   Final Clinical Impression(s) / ED Diagnoses Final diagnoses:  Atrial fibrillation, unspecified type ([place])  Hypokalemia  Shortness of breath  Precordial pain    Rx / DC Orders ED Discharge Orders     None        [name], MD 04/01/21 2312

## 2021-04-01 NOTE — ED Triage Notes (Signed)
Pt here with reports of chest tightness for 3 days. Pt states it is getting harder to catch his breath. Denies diaphoresis, NV.

## 2021-04-02 LAB — TROPONIN I (HIGH SENSITIVITY): Troponin I (High Sensitivity): 14 ng/L (ref <18)

## 2021-04-02 MED ORDER — POTASSIUM CHLORIDE ER 10 MEQ PO TBCR
20.0000 meq | EXTENDED_RELEASE_TABLET | Freq: Every day | ORAL | 0 refills | Status: DC
Start: 2021-04-02 — End: 2021-05-10

## 2021-04-02 MED ORDER — APIXABAN 5 MG PO TABS
5.0000 mg | ORAL_TABLET | Freq: Once | ORAL | Status: AC
  Administered 2021-04-02: 5 mg via ORAL
  Filled 2021-04-02: qty 1

## 2021-04-02 MED ORDER — METOPROLOL TARTRATE 25 MG PO TABS
12.5000 mg | ORAL_TABLET | Freq: Once | ORAL | Status: AC
  Administered 2021-04-02: 12.5 mg via ORAL
  Filled 2021-04-02: qty 1

## 2021-04-02 MED ORDER — APIXABAN 5 MG PO TABS
5.0000 mg | ORAL_TABLET | Freq: Two times a day (BID) | ORAL | 0 refills | Status: DC
Start: 2021-04-02 — End: 2021-04-06

## 2021-04-02 MED ORDER — METOPROLOL TARTRATE 25 MG PO TABS: 12.5000 mg | ORAL_TABLET | Freq: Two times a day (BID) | ORAL | 0 refills | Status: DC

## 2021-04-02 NOTE — ED Notes (Signed)
Pt discharged and ambulated out of the ED without difficulty. 

## 2021-04-02 NOTE — ED Provider Notes (Signed)
I assumed care of this patient.  Please see previous provider note for further details of Hx, PE.  Briefly patient is a 80 y.o. male who presented chest pain.  EKG without acute ischemic changes.  Noted to be in A. fib which is new onset for the patient.  Rate controlled. Chest pain improved with aspirin and nitroglycerin. Initial troponin was negative. Rest of the work-up was reassuring.  Plan for delta troponin. If negative patient can go home with close cardiology follow-up.  Chads Vasc score of 4. Consulted cardiology regarding rate control medication as patient's heart rate is already controlled. They recommended metoprolol either 25 mg twice daily or 12.5 mg twice daily.  They agree with Eliquis 5 mg twice daily.  Delta troponin negative. Patient given first doses of Eliquis and metoprolol 12.5 mg.  Tolerated them well.  The patient appears reasonably screened and/or stabilized for discharge and I doubt any other medical condition or other [facility] requiring further screening, evaluation, or treatment in the ED at this time prior to discharge. Safe for discharge with strict return precautions.  Disposition: Discharge  Condition: Good  I have discussed the results, Dx and Tx plan with the patient/family who expressed understanding and agree(s) with the plan. Discharge instructions discussed at length. The patient/family was given strict return precautions who verbalized understanding of the instructions. No further questions at time of discharge.    ED Discharge Orders          Ordered    Amb Referral to AFIB Clinic        04/02/21 0609    apixaban (ELIQUIS) 5 MG TABS tablet  2 times daily        04/02/21 0609    metoprolol tartrate (LOPRESSOR) 25 MG tablet  2 times daily        04/02/21 0609    potassium chloride (KLOR-CON) 10 MEQ tablet  Daily        04/02/21 0609            Follow Up: [place] [phone] Call  if you have not been called about your appointment by Wednesday  Sueanne Margarita, [place] [address] [phone]  Call  as needed         Fatima Blank, MD 04/02/21 [phone]

## 2021-04-04 ENCOUNTER — Telehealth: Payer: Self-pay | Admitting: Cardiovascular Disease

## 2021-04-04 NOTE — Telephone Encounter (Signed)
Called patient regarding call from his daughter this morning. He states he had planned to call our office and that I can talk with him. I advised that I have reviewed his ED visit and emphasized the need for Eliquis or other blood thinner and metoprolol. He asked about measurement of HR with Apple watch and we discussed using the EKG feature and monitoring to keep HR <120 bpm. I also advised him to keep a symptom log. He would like to see a provider, soonest available appointment and he is aware that I will check availability at both A Fib clinic and our [facility] office. Scheduled patient to see Adline Peals, PA on 8/10 at 10:30 am. Patient verbalized understanding and agreement and agrees to call back with additional questions or concerns. He thanked me for the call.

## 2021-04-04 NOTE — Telephone Encounter (Signed)
Pt was recently hospitalized, daughter Lynelle Smoke would like a call back to discuss results from ED and to possibly get an earlier appt  other than the one that was offered to her. Pt's daughter is not on DPR but she lives close to parents. Please advise pt further

## 2021-04-06 ENCOUNTER — Ambulatory Visit (HOSPITAL_COMMUNITY)
Admission: RE | Admit: 2021-04-06 | Discharge: 2021-04-06 | Disposition: A | Discharge status: Home / Self Care | Payer: PPO | Source: Ambulatory Visit | Attending: Physician Assistant | Admitting: Physician Assistant

## 2021-04-06 ENCOUNTER — Encounter (HOSPITAL_COMMUNITY): Payer: Self-pay | Admitting: Physician Assistant

## 2021-04-06 ENCOUNTER — Other Ambulatory Visit: Payer: Self-pay

## 2021-04-06 VITALS — BP 144/64 | HR 63 | Ht 72.0 in | Wt 204.6 lb

## 2021-04-06 DIAGNOSIS — E785 Hyperlipidemia, unspecified: Secondary | ICD-10-CM | POA: Diagnosis not present

## 2021-04-06 DIAGNOSIS — Z7901 Long term (current) use of anticoagulants: Secondary | ICD-10-CM | POA: Insufficient documentation

## 2021-04-06 DIAGNOSIS — I480 Paroxysmal atrial fibrillation: Secondary | ICD-10-CM

## 2021-04-06 DIAGNOSIS — I10 Essential (primary) hypertension: Secondary | ICD-10-CM | POA: Insufficient documentation

## 2021-04-06 DIAGNOSIS — Z79899 Other long term (current) drug therapy: Secondary | ICD-10-CM | POA: Diagnosis not present

## 2021-04-06 DIAGNOSIS — I700 Atherosclerosis of aorta: Secondary | ICD-10-CM | POA: Insufficient documentation

## 2021-04-06 DIAGNOSIS — D6869 Other thrombophilia: Secondary | ICD-10-CM

## 2021-04-06 DIAGNOSIS — I4819 Other persistent atrial fibrillation: Secondary | ICD-10-CM | POA: Insufficient documentation

## 2021-04-06 LAB — BASIC METABOLIC PANEL
Anion gap: 9 (ref 5–15)
BUN: 18 mg/dL (ref 8–23)
CO2: 29 mmol/L (ref 22–32)
Calcium: 9.6 mg/dL (ref 8.9–10.3)
Chloride: 100 mmol/L (ref 98–111)
Creatinine, Ser: 1.21 mg/dL (ref 0.61–1.24)
GFR, Estimated: >60 mL/min (ref >60)
Glucose, Bld: 98 mg/dL (ref 70–99)
Potassium: 4.1 mmol/L (ref 3.5–5.1)
Sodium: 138 mmol/L (ref 135–145)

## 2021-04-06 MED ORDER — APIXABAN 5 MG PO TABS: 5.0000 mg | ORAL_TABLET | Freq: Two times a day (BID) | ORAL | 3 refills | Status: DC

## 2021-04-06 NOTE — Patient Instructions (Signed)
Decrease amlodipine to '10mg'$  once a day  Start metoprolol 12.'5mg'$  twice a day

## 2021-04-06 NOTE — Progress Notes (Signed)
Primary Care Physician: Sueanne Margarita, DO Primary Cardiologist: Dr Burt Knack Primary Electrophysiologist: none Referring Physician: [facility]   Kevin Day is a 80 y.o. male with a history of HLD, aortic atherosclerosis, dilated aortic root, HTN, atrial fibrillation who presents for consultation in the [facility].  The patient was initially diagnosed with atrial fibrillation 04/01/21 after presenting to the ED with symptoms of chest discomfort and SOB. ECG showed rate controlled afib. His K+ was 2.9. he was started on Eliquis for a CHADS2VASC score of 4, metoprolol, and KCL. He denies any further symptoms. He admits he has not started metoprolol because he was not sure how it would interact with his other BP medications. He is back in SR.  Today, he denies symptoms of palpitations, chest pain, shortness of breath, orthopnea, PND, lower extremity edema, dizziness, presyncope, syncope, snoring, daytime somnolence, bleeding, or neurologic sequela. The patient is tolerating medications without difficulties and is otherwise without complaint today.    Atrial Fibrillation Risk Factors:  he does not have symptoms or diagnosis of sleep apnea. he does not have a history of rheumatic fever. he does not have a history of alcohol use.   he has a BMI of Body mass index is 27.75 kg/m.Marland Kitchen Filed Weights   04/06/21 1021  Weight: 92.8 kg    Family History  Problem Relation Age of Onset   Breast cancer Sister    Colon cancer Neg Hx    Esophageal cancer Neg Hx    Rectal cancer Neg Hx    Stomach cancer Neg Hx    Colon polyps Neg Hx      Atrial Fibrillation Management history:  Previous antiarrhythmic drugs: none Previous cardioversions: none Previous ablations: none CHADS2VASC score: 4 Anticoagulation history: Eliquis   Past Medical History:  Diagnosis Date   Arthritis    Cataracts, bilateral    Hyperlipidemia    Hypertension    Prostate cancer ([place])     Prostate cancer ([place])    Prostate cancer ([place])    Ruptured lumbar disc    Skin cancer    Past Surgical History:  Procedure Laterality Date   BACK SURGERY     cataract Bilateral    COLONOSCOPY  10/09/2016   POLYPECTOMY     radioactive      radioactive seed implants for prostate CA.     Current Outpatient Medications  Medication Sig Dispense Refill   amLODipine (NORVASC) 10 MG tablet Take 10 mg by mouth daily.     apixaban (ELIQUIS) 5 MG TABS tablet Take 1 tablet (5 mg total) by mouth 2 (two) times daily. 60 tablet 0   atorvastatin (LIPITOR) 10 MG tablet Take 10 mg by mouth daily.     Cholecalciferol (VITAMIN D3) 50 MCG (2000 UT) TABS Take 2,000 Units by mouth at bedtime.     famotidine (PEPCID) 20 MG tablet Take 1 tablet (20 mg total) by mouth 2 (two) times daily. 30 tablet 0   Glucosamine HCl (GLUCOSAMINE PO) Take 1,500 mg by mouth at bedtime.     metoprolol tartrate (LOPRESSOR) 25 MG tablet Take 0.5 tablets (12.5 mg total) by mouth 2 (two) times daily. 30 tablet 0   Multiple Vitamins-Minerals (OCUVITE PO) Take 1 tablet by mouth at bedtime.     omeprazole (PRILOSEC OTC) 20 MG tablet Take 20 mg by mouth daily as needed (heartburn).     potassium chloride (KLOR-CON) 10 MEQ tablet Take 2 tablets (20 mEq total) by mouth daily  for 14 days. 28 tablet 0   triamterene-hydrochlorothiazide (MAXZIDE-25) 37.5-25 MG tablet Take 1 tablet by mouth daily.     No current facility-administered medications for this encounter.    No Known Allergies  Social History   Socioeconomic History   Marital status: Married    Spouse name: Not on file   Number of children: Not on file   Years of education: Not on file   Highest education level: Not on file  Occupational History   Not on file  Tobacco Use   Smoking status: Former   Smokeless tobacco: Never  Vaping Use   Vaping Use: Never used  Substance and Sexual Activity   Alcohol use: No   Drug use: No   Sexual activity: Not on file  Other  Topics Concern   Not on file  Social History Narrative   Not on file   Social Determinants of Health   Financial Resource Strain: Not on file  Food Insecurity: Not on file  Transportation Needs: Not on file  Physical Activity: Not on file  Stress: Not on file  Social Connections: Not on file  Intimate Partner Violence: Not on file     ROS- All systems are reviewed and negative except as per the HPI above.  Physical Exam: Vitals:   04/06/21 1021  BP: (!) 144/64  Pulse: 63  Weight: 92.8 kg  Height: 6' (1.829 m)    GEN- The patient is a well appearing elderly male, alert and oriented x 3 today.   Head- normocephalic, atraumatic Eyes-  Sclera clear, conjunctiva pink Ears- hearing intact Oropharynx- clear Neck- supple  Lungs- Clear to ausculation bilaterally, normal work of breathing Heart- Regular rate and rhythm, no murmurs, rubs or gallops  GI- soft, NT, ND, + BS Extremities- no clubbing, cyanosis, or edema MS- no significant deformity or atrophy Skin- no rash or lesion Psych- euthymic mood, full affect Neuro- strength and sensation are intact  Wt Readings from Last 3 Encounters:  04/06/21 92.8 kg  10/27/19 95.3 kg  10/13/19 95.3 kg    EKG today demonstrates  SR Vent. rate 63 BPM PR interval 186 ms QRS duration 106 ms QT/QTcB 416/425 ms  Echo 03/20/21 demonstrated   1. The left ventricle has normal systolic function, with an ejection  fraction of 55-60%. The cavity size was normal. Left ventricular diastolic  Doppler parameters are consistent with impaired relaxation.   2. The average left ventricular global longitudinal strain is normal at  -18.8 %.   3. The right ventricle has normal systolic function. The cavity was  normal. There is no increase in right ventricular wall thickness.   4. There is mild mitral annular calcification present.   5. The aortic valve has an indeterminate number of cusps. Moderate  sclerosis of the aortic valve. Aortic valve  regurgitation is mild by color  flow Doppler.   6. The aorta is abnormal in size and structure.   7. There is mild dilatation of the aortic root and of the ascending aorta  measuring 41 mm and 30m respectively.   Epic records are reviewed at length today  CHA2DS2-VASc Score = 4  The patient's score is based upon: CHF History: No HTN History: Yes Diabetes History: No Stroke History: No Vascular Disease History: Yes (aortic atherosclerosis) Age Score: 2 Gender Score: 0     ASSESSMENT AND PLAN: 1. Paroxysmal Atrial Fibrillation (ICD10:  I48.0) The patient's CHA2DS2-VASc score is 4, indicating a 4.8% annual risk of stroke.  General education about afib provided and questions answered. We also discussed his stroke risk and the risks and benefits of anticoagulation. Start Lopressor 12.5 mg BID Continue Eliquis 5 mg BID  2. Secondary Hypercoagulable State (ICD10:  D68.69) The patient is at significant risk for stroke/thromboembolism based upon his CHA2DS2-VASc Score of 4.  Continue Apixaban (Eliquis).   3. HTN Decrease amlodipine to 10 mg daily to accommodate BB.    Follow up in the AF clinic in one month. Dr Cooper's office in 3-4 months.    [facility] [address] [place], [place] [address] [phone] 04/06/2021 10:35 AM

## 2021-04-07 DIAGNOSIS — E876 Hypokalemia: Secondary | ICD-10-CM | POA: Diagnosis not present

## 2021-04-07 DIAGNOSIS — I4891 Unspecified atrial fibrillation: Secondary | ICD-10-CM | POA: Diagnosis not present

## 2021-04-07 DIAGNOSIS — I700 Atherosclerosis of aorta: Secondary | ICD-10-CM | POA: Diagnosis not present

## 2021-04-07 DIAGNOSIS — I712 Thoracic aortic aneurysm, without rupture: Secondary | ICD-10-CM | POA: Diagnosis not present

## 2021-04-07 DIAGNOSIS — I10 Essential (primary) hypertension: Secondary | ICD-10-CM | POA: Diagnosis not present

## 2021-04-07 DIAGNOSIS — J439 Emphysema, unspecified: Secondary | ICD-10-CM | POA: Diagnosis not present

## 2021-04-07 DIAGNOSIS — D692 Other nonthrombocytopenic purpura: Secondary | ICD-10-CM | POA: Diagnosis not present

## 2021-04-07 DIAGNOSIS — Z7189 Other specified counseling: Secondary | ICD-10-CM | POA: Diagnosis not present

## 2021-04-07 DIAGNOSIS — D6869 Other thrombophilia: Secondary | ICD-10-CM | POA: Diagnosis not present

## 2021-04-27 DIAGNOSIS — R2231 Localized swelling, mass and lump, right upper limb: Secondary | ICD-10-CM | POA: Diagnosis not present

## 2021-04-27 DIAGNOSIS — R2233 Localized swelling, mass and lump, upper limb, bilateral: Secondary | ICD-10-CM | POA: Diagnosis not present

## 2021-05-10 ENCOUNTER — Encounter (HOSPITAL_COMMUNITY): Payer: Self-pay | Admitting: Physician Assistant

## 2021-05-10 ENCOUNTER — Other Ambulatory Visit: Payer: Self-pay

## 2021-05-10 ENCOUNTER — Ambulatory Visit (HOSPITAL_COMMUNITY)
Admission: RE | Admit: 2021-05-10 | Discharge: 2021-05-10 | Disposition: A | Discharge status: Home / Self Care | Payer: PPO | Source: Ambulatory Visit | Attending: Physician Assistant | Admitting: Physician Assistant

## 2021-05-10 VITALS — BP 138/72 | HR 59 | Ht 72.0 in | Wt 204.0 lb

## 2021-05-10 DIAGNOSIS — I480 Paroxysmal atrial fibrillation: Secondary | ICD-10-CM | POA: Insufficient documentation

## 2021-05-10 DIAGNOSIS — E785 Hyperlipidemia, unspecified: Secondary | ICD-10-CM | POA: Insufficient documentation

## 2021-05-10 DIAGNOSIS — I10 Essential (primary) hypertension: Secondary | ICD-10-CM | POA: Insufficient documentation

## 2021-05-10 DIAGNOSIS — D6869 Other thrombophilia: Secondary | ICD-10-CM | POA: Diagnosis not present

## 2021-05-10 DIAGNOSIS — Z7901 Long term (current) use of anticoagulants: Secondary | ICD-10-CM | POA: Insufficient documentation

## 2021-05-10 DIAGNOSIS — Z79899 Other long term (current) drug therapy: Secondary | ICD-10-CM | POA: Insufficient documentation

## 2021-05-10 DIAGNOSIS — R0789 Other chest pain: Secondary | ICD-10-CM | POA: Insufficient documentation

## 2021-05-10 DIAGNOSIS — Z87891 Personal history of nicotine dependence: Secondary | ICD-10-CM | POA: Insufficient documentation

## 2021-05-10 DIAGNOSIS — Z09 Encounter for follow-up examination after completed treatment for conditions other than malignant neoplasm: Secondary | ICD-10-CM | POA: Diagnosis not present

## 2021-05-10 MED ORDER — POTASSIUM CHLORIDE ER 10 MEQ PO TBCR: 20.0000 meq | EXTENDED_RELEASE_TABLET | Freq: Every day | ORAL | 6 refills | Status: DC

## 2021-05-10 MED ORDER — METOPROLOL TARTRATE 25 MG PO TABS: 12.5000 mg | ORAL_TABLET | Freq: Two times a day (BID) | ORAL | 6 refills | Status: DC

## 2021-05-10 MED ORDER — APIXABAN 5 MG PO TABS
5.0000 mg | ORAL_TABLET | Freq: Two times a day (BID) | ORAL | 6 refills | Status: DC
Start: 2021-05-10 — End: 2021-11-29

## 2021-05-10 NOTE — Progress Notes (Signed)
Primary Care Physician: Sueanne Margarita, DO Primary Cardiologist: Dr Burt Knack Primary Electrophysiologist: none Referring Physician: [facility]   Kevin Day is a 80 y.o. male with a history of HLD, aortic atherosclerosis, dilated aortic root, HTN, atrial fibrillation who presents for follow up in the [facility].  The patient was initially diagnosed with atrial fibrillation 04/01/21 after presenting to the ED with symptoms of chest discomfort and SOB. ECG showed rate controlled afib. His K+ was 2.9. he was started on Eliquis for a CHADS2VASC score of 4, metoprolol, and KCL.   On follow up today, patient reports that he has done well since his last visit. His smart watch has shown only SR. He does complain of mild, central chest discomfort which does not radiate. He states it is near constant but deep breathing does help. He states it has been chronic for years. He denies any bleeding issues on anticoagulation.   Today, he denies symptoms of palpitations, shortness of breath, orthopnea, PND, lower extremity edema, dizziness, presyncope, syncope, snoring, daytime somnolence, bleeding, or neurologic sequela. The patient is tolerating medications without difficulties and is otherwise without complaint today.    Atrial Fibrillation Risk Factors:  he does not have symptoms or diagnosis of sleep apnea. he does not have a history of rheumatic fever. he does not have a history of alcohol use.   he has a BMI of Body mass index is 27.67 kg/m.Marland Kitchen Filed Weights   05/10/21 0957  Weight: 92.5 kg    Family History  Problem Relation Age of Onset   Breast cancer Sister    Colon cancer Neg Hx    Esophageal cancer Neg Hx    Rectal cancer Neg Hx    Stomach cancer Neg Hx    Colon polyps Neg Hx      Atrial Fibrillation Management history:  Previous antiarrhythmic drugs: none Previous cardioversions: none Previous ablations: none CHADS2VASC score: 4 Anticoagulation  history: Eliquis   Past Medical History:  Diagnosis Date   Arthritis    Cataracts, bilateral    Hyperlipidemia    Hypertension    Prostate cancer ([place])    Prostate cancer ([place])    Prostate cancer ([place])    Ruptured lumbar disc    Skin cancer    Past Surgical History:  Procedure Laterality Date   BACK SURGERY     cataract Bilateral    COLONOSCOPY  10/09/2016   POLYPECTOMY     radioactive      radioactive seed implants for prostate CA.     Current Outpatient Medications  Medication Sig Dispense Refill   amLODipine (NORVASC) 10 MG tablet Take 10 mg by mouth daily.     apixaban (ELIQUIS) 5 MG TABS tablet Take 1 tablet (5 mg total) by mouth 2 (two) times daily. 60 tablet 3   atorvastatin (LIPITOR) 10 MG tablet Take 10 mg by mouth daily.     Cholecalciferol (VITAMIN D3) 50 MCG (2000 UT) TABS Take 2,000 Units by mouth at bedtime.     famotidine (PEPCID) 20 MG tablet Take 1 tablet (20 mg total) by mouth 2 (two) times daily. 30 tablet 0   Glucosamine HCl (GLUCOSAMINE PO) Take 1,500 mg by mouth at bedtime.     metoprolol tartrate (LOPRESSOR) 25 MG tablet Take 12.5 mg by mouth 2 (two) times daily.     Multiple Vitamins-Minerals (OCUVITE PO) Take 1 tablet by mouth at bedtime.     omeprazole (PRILOSEC OTC) 20 MG tablet Take  20 mg by mouth daily as needed (heartburn).     potassium chloride (KLOR-CON) 10 MEQ tablet Take 2 tablets (20 mEq total) by mouth daily for 14 days. 28 tablet 0   triamterene-hydrochlorothiazide (MAXZIDE-25) 37.5-25 MG tablet Take 1 tablet by mouth daily.     No current facility-administered medications for this encounter.    No Known Allergies  Social History   Socioeconomic History   Marital status: Married    Spouse name: Not on file   Number of children: Not on file   Years of education: Not on file   Highest education level: Not on file  Occupational History   Not on file  Tobacco Use   Smoking status: Former   Smokeless tobacco: Never   Tobacco  comments:    Former smoker 05/10/2021  Vaping Use   Vaping Use: Never used  Substance and Sexual Activity   Alcohol use: No   Drug use: No   Sexual activity: Not on file  Other Topics Concern   Not on file  Social History Narrative   Not on file   Social Determinants of Health   Financial Resource Strain: Not on file  Food Insecurity: Not on file  Transportation Needs: Not on file  Physical Activity: Not on file  Stress: Not on file  Social Connections: Not on file  Intimate Partner Violence: Not on file     ROS- All systems are reviewed and negative except as per the HPI above.  Physical Exam: Vitals:   05/10/21 0957  BP: 138/72  Pulse: (!) 59  Weight: 92.5 kg  Height: 6' (1.829 m)   GEN- The patient is a well appearing elderly male, alert and oriented x 3 today.   HEENT-head normocephalic, atraumatic, sclera clear, conjunctiva pink, hearing intact, trachea midline. Lungs- Clear to ausculation bilaterally, normal work of breathing Heart- Regular rate and rhythm, no murmurs, rubs or gallops  GI- soft, NT, ND, + BS Extremities- no clubbing, cyanosis, or edema MS- no significant deformity or atrophy Skin- no rash or lesion Psych- euthymic mood, full affect Neuro- strength and sensation are intact   Wt Readings from Last 3 Encounters:  05/10/21 92.5 kg  04/06/21 92.8 kg  10/27/19 95.3 kg    EKG today demonstrates  SB Vent. rate 59 BPM PR interval 184 ms QRS duration 108 ms QT/QTcB 410/405 ms  Echo 03/20/21 demonstrated   1. The left ventricle has normal systolic function, with an ejection  fraction of 55-60%. The cavity size was normal. Left ventricular diastolic Doppler parameters are consistent with impaired relaxation.   2. The average left ventricular global longitudinal strain is normal at  -18.8 %.   3. The right ventricle has normal systolic function. The cavity was  normal. There is no increase in right ventricular wall thickness.   4. There is  mild mitral annular calcification present.   5. The aortic valve has an indeterminate number of cusps. Moderate  sclerosis of the aortic valve. Aortic valve regurgitation is mild by color  flow Doppler.   6. The aorta is abnormal in size and structure.   7. There is mild dilatation of the aortic root and of the ascending aorta  measuring 41 mm and 62m respectively.   Epic records are reviewed at length today  CHA2DS2-VASc Score = 4  The patient's score is based upon: CHF History: 0 HTN History: 1 Diabetes History: 0 Stroke History: 0 Vascular Disease History: 1 (aortic atherosclerosis) Age Score: 2 Gender Score:  0     ASSESSMENT AND PLAN: 1. Paroxysmal Atrial Fibrillation (ICD10:  I48.0) The patient's CHA2DS2-VASc score is 4, indicating a 4.8% annual risk of stroke.   Patient appears to be maintaining SR. Continue Lopressor 12.5 mg BID Continue Eliquis 5 mg BID  2. Secondary Hypercoagulable State (ICD10:  D68.69) The patient is at significant risk for stroke/thromboembolism based upon his CHA2DS2-VASc Score of 4.  Continue Apixaban (Eliquis).   3. HTN Stable, no changes today.  4. Chest discomfort Patient reports mild, near constant chest discomfort. This has been chronic for years. Low risk stress test in 2020. Will defer to primary cardiology team if ischemic workup is indicated.     Follow up with Laurann Montana as scheduled. AF clinic as needed.    [facility] [address] [place], [place] [address] [phone] 05/10/2021 10:03 AM

## 2021-05-13 ENCOUNTER — Other Ambulatory Visit (HOSPITAL_BASED_OUTPATIENT_CLINIC_OR_DEPARTMENT_OTHER): Payer: Self-pay | Admitting: Family

## 2021-05-13 ENCOUNTER — Ambulatory Visit (HOSPITAL_BASED_OUTPATIENT_CLINIC_OR_DEPARTMENT_OTHER): Payer: PPO | Admitting: Family

## 2021-05-13 ENCOUNTER — Encounter (HOSPITAL_BASED_OUTPATIENT_CLINIC_OR_DEPARTMENT_OTHER): Payer: Self-pay | Admitting: Family

## 2021-05-13 ENCOUNTER — Other Ambulatory Visit: Payer: Self-pay

## 2021-05-13 VITALS — BP 130/70 | HR 61 | Ht 72.0 in | Wt 204.8 lb

## 2021-05-13 DIAGNOSIS — K219 Gastro-esophageal reflux disease without esophagitis: Secondary | ICD-10-CM

## 2021-05-13 DIAGNOSIS — I480 Paroxysmal atrial fibrillation: Secondary | ICD-10-CM | POA: Diagnosis not present

## 2021-05-13 DIAGNOSIS — Z7901 Long term (current) use of anticoagulants: Secondary | ICD-10-CM

## 2021-05-13 DIAGNOSIS — R079 Chest pain, unspecified: Secondary | ICD-10-CM | POA: Diagnosis not present

## 2021-05-13 DIAGNOSIS — I2584 Coronary atherosclerosis due to calcified coronary lesion: Secondary | ICD-10-CM

## 2021-05-13 DIAGNOSIS — I2510 Atherosclerotic heart disease of native coronary artery without angina pectoris: Secondary | ICD-10-CM

## 2021-05-13 DIAGNOSIS — I10 Essential (primary) hypertension: Secondary | ICD-10-CM | POA: Diagnosis not present

## 2021-05-13 DIAGNOSIS — D6869 Other thrombophilia: Secondary | ICD-10-CM

## 2021-05-13 MED ORDER — PANTOPRAZOLE SODIUM 20 MG PO TBEC: 20.0000 mg | DELAYED_RELEASE_TABLET | Freq: Every day | ORAL | 0 refills | Status: DC

## 2021-05-13 NOTE — Progress Notes (Signed)
Office Visit    Patient Name: Kevin Day Date of Encounter: 05/13/2021  PCP:  Sueanne Margarita, [facility]  Cardiologist:  Sherren Mocha, MD  Advanced Practice Provider:  No care team member to display Electrophysiologist:  None    Chief Complaint    Kevin Day is a 80 y.o. male with a hx of hyperlipidemia, aortic atherosclerosis, dilated aortic root, hypertension, atrial fibrillation presents today for follow-up of atrial fibrillation  Past Medical History    Past Medical History:  Diagnosis Date   Arthritis    Cataracts, bilateral    Hyperlipidemia    Hypertension    Prostate cancer ([place])    Prostate cancer ([place])    Prostate cancer ([place])    Ruptured lumbar disc    Skin cancer    Past Surgical History:  Procedure Laterality Date   BACK SURGERY     cataract Bilateral    COLONOSCOPY  10/09/2016   POLYPECTOMY     radioactive      radioactive seed implants for prostate CA.     Allergies  No Known Allergies  History of Present Illness    Kevin Day is a 80 y.o. male with a hx of HLD, aortic atherosclerosis, dilated arotic root, HTN, atrial fibrillation last seen by atrial fibrillation clinic 05/10/2021.  Kevin Day follows with Dr. Burt Knack.  He was seen in the ED July 2020 for chest discomfort.  Lexiscan Myoview 02/2019 low risk study.  Echocardiogram 02/2019 with normal LVEF, mild AI, mild dilation of the aortic root and ascending aorta 41 mm and 42 mm respectively.  Diagnosed with atrial fibrillation 04/01/21 after presenting to ED with chest discomfort. He was started on Eliquis, Metoprolol, and KCL. Seen in atrial fib clinic 05/10/21 doing well and maintaining NSR. He did report mild central chest discomfort which did not radiate. He was recommended for general cardiology follow up.   Presents today for follow-up with his granddaughter.Reports a sensation of a burning in his mid chest htat has been ongoing. Tell sme me it happens  about all the time whether at rest or exertion. Tells me it just started when he with his atrial fibrillation. Denies palpitations, orthopnea, PND, edema, dyspnea.Denies bleeding complications on Eliquis.   EKGs/Labs/Other Studies Reviewed:   The following studies were reviewed today:   EKG:  No EKG today.  Recent Labs: 04/01/2021: ALT 16; B Natriuretic Peptide 132.0; Hemoglobin 14.9; Platelets 177 04/06/2021: BUN 18; Creatinine, Ser 1.21; Potassium 4.1; Sodium 138  Recent Lipid Panel    Component Value Date/Time   CHOL 142 05/30/2019 0750   TRIG 151 (H) 05/30/2019 0750   HDL 39 (L) 05/30/2019 0750   CHOLHDL 3.6 05/30/2019 0750   LDLCALC 77 05/30/2019 0750    Risk Assessment/Calculations:   CHA2DS2-VASc Score = 4   This indicates a 4.8% annual risk of stroke. The patient's score is based upon: CHF History: 0 HTN History: 1 Diabetes History: 0 Stroke History: 0 Vascular Disease History: 1 (aortic atherosclerosis) Age Score: 2 Gender Score: 0    Home Medications   Current Meds  Medication Sig   amLODipine (NORVASC) 10 MG tablet Take 10 mg by mouth daily.   apixaban (ELIQUIS) 5 MG TABS tablet Take 1 tablet (5 mg total) by mouth 2 (two) times daily.   atorvastatin (LIPITOR) 10 MG tablet Take 10 mg by mouth daily.   Cholecalciferol (VITAMIN D3) 50 MCG (2000 UT) TABS Take 2,000 Units by mouth  at bedtime.   metoprolol tartrate (LOPRESSOR) 25 MG tablet Take 0.5 tablets (12.5 mg total) by mouth 2 (two) times daily.   Multiple Vitamins-Minerals (OCUVITE PO) Take 1 tablet by mouth at bedtime.   potassium chloride (KLOR-CON) 10 MEQ tablet Take 2 tablets (20 mEq total) by mouth daily.   triamterene-hydrochlorothiazide (MAXZIDE-25) 37.5-25 MG tablet Take 1 tablet by mouth daily.   [DISCONTINUED] famotidine (PEPCID) 20 MG tablet Take 1 tablet (20 mg total) by mouth 2 (two) times daily.   [DISCONTINUED] Glucosamine HCl (GLUCOSAMINE PO) Take 1,500 mg by mouth at bedtime.    [DISCONTINUED] omeprazole (PRILOSEC OTC) 20 MG tablet Take 20 mg by mouth daily as needed (heartburn).     Review of Systems      All other systems reviewed and are otherwise negative except as noted above.  Physical Exam    VS:  BP 130/70   Pulse 61   Ht 6' (1.829 m)   Wt 204 lb 12.8 oz (92.9 kg)   SpO2 97%   BMI 27.78 kg/m  , BMI Body mass index is 27.78 kg/m.  Wt Readings from Last 3 Encounters:  05/13/21 204 lb 12.8 oz (92.9 kg)  05/10/21 204 lb (92.5 kg)  04/06/21 204 lb 9.6 oz (92.8 kg)     GEN: Well nourished, well developed, in no acute distress. HEENT: normal. Neck: Supple, no JVD, carotid bruits, or masses. Cardiac: RRR, no murmurs, rubs, or gallops. No clubbing, cyanosis, edema.  Radials/PT 2+ and equal bilaterally.  Respiratory:  Respirations regular and unlabored, clear to auscultation bilaterally. GI: Soft, nontender, nondistended. MS: No deformity or atrophy. Skin: Warm and dry, no rash. Neuro:  Strength and sensation are intact. Psych: Normal affect.  Assessment & Plan    Chest pain / coronary artery calcification on CT -CT 03/2021 with coronary artery calcification.  New onset midsternal chest pain that occurs at rest and with activity.  Describes as burning.  Consider etiology GERD versus CAD.  Plan for cardiac CTA to rule out ischemia. Heart healthy diet and regular cardiovascular exercise encouraged.  Will get CBC/BMP 3-5 days prior to cardiac CTA.  Dilated aortic root and ascending aorta - Update echo. Continue optimal BP control.  PAF / Chronic anticoagulation -maintaining NSR by auscultation today.  Denies palpitations.  Update echocardiogram to rule out valvular abnormality causing atrial fibrillation.  Continue metoprolol titrate 12.5 mg twice daily.  Continue Eliquis 5 mg twice daily.  Does not meet dose reduction criteria. CHA2DS2-VASc Score = 4 [CHF History: 0, HTN History: 1, Diabetes History: 0, Stroke History: 0, Vascular Disease History: 1  (aortic atherosclerosis), Age Score: 2, Gender Score: 0].  Therefore, the patient's annual risk of stroke is 4.8 %.     GERD -consider for possible etiology of chest discomfort.  Recommend Protonix 20 mg daily for 2 weeks.  He would prefer only to be on medication for short time given multiple recent medication changes.  If 2-week course is insufficient, plan for long-term utilization versus increased dose.  Education provided on GERD friendly diet.  Disposition: Follow up in 6 week(s) with Dr. Burt Knack or APP.  Signed, Loel Dubonnet, NP 05/13/2021, 11:34 AM [place]

## 2021-05-13 NOTE — Patient Instructions (Addendum)
Medication Instructions:  Your physician has recommended you make the following change in your medication:   START Pantoprazole '20mg'$  daily for 2 weeks  After two weeks, if your indigestion has resolved you may discontinue   The morning of your CT scan - take a whole tablet of Metoprolol  *If you need a refill on your cardiac medications before your next appointment, please call your pharmacy*   Lab Work: Your physician recommends that you return for lab work 3-5 days prior to cardiac CTA for BMP, CBC. When your CT date has been set, call us or send a MyChart message and we will get a lab appointment set up at our [facility] office.  If you have labs (blood work) drawn today and your tests are completely normal, you will receive your results only by: [place] (if you have MyChart) OR A paper copy in the mail If you have any lab test that is abnormal or we need to change your treatment, we will call you to review the results.   Testing/Procedures: Your physician has requested that you have an echocardiogram. Echocardiography is a painless test that uses sound waves to create images of your heart. It provides your doctor with information about the size and shape of your heart and how well your heart's chambers and valves are working. This procedure takes approximately one hour. There are no restrictions for this procedure.   Your physician has requested that you have cardiac CT. Cardiac computed tomography (CT) is a painless test that uses an x-ray machine to take clear, detailed pictures of your heart. Please follow instruction sheet as given.  Follow-Up: At [facility], you and your health needs are our priority.  As part of our continuing mission to provide you with exceptional heart care, we have created designated Provider Care Teams.  These Care Teams include your primary Cardiologist (physician) and Advanced Practice Providers (APPs -  Physician Assistants and Nurse  Practitioners) who all work together to provide you with the care you need, when you need it.  We recommend signing up for the patient portal called "MyChart".  Sign up information is provided on this After Visit Summary.  MyChart is used to connect with patients for Virtual Visits (Telemedicine).  Patients are able to view lab/test results, encounter notes, upcoming appointments, etc.  Non-urgent messages can be sent to your provider as well.   To learn more about what you can do with MyChart, go to [email or web address].    Your next appointment:   6 week(s)  The format for your next appointment:   In Person  Provider:   You may see Sherren Mocha, MD or one of the following Advanced Practice Providers on your designated Care Team:   Richardson Dopp, PA-C [place], [place] Loel Dubonnet, NP    Other Instructions    Your cardiac CT will be scheduled at one of the below locations:   [facility] [address] [place], [place] [address] [phone]  [place] [address] [place], [place] [address] [phone]  Please arrive at the [facility] main entrance (entrance A) of [facility] 30 minutes prior to test start time. Proceed to the [facility] Radiology Department (first floor) to check-in and test prep.  Please follow these instructions carefully (unless otherwise directed):  Hold all erectile dysfunction medications at least 3 days (72 hrs) prior to test.  On the Night Before the Test:  Be sure to Drink plenty of water. Do not consume any caffeinated/decaffeinated beverages or chocolate 12 hours prior to your test. Do not take any antihistamines 12 hours prior to your test.  On the Day of the Test: Drink plenty of water until 1 hour prior to the test. Do not eat any food 4 hours prior to the test. You may take your regular medications prior to the test.  Take metoprolol (Lopressor) two  hours prior to test. HOLD Triamterene-HCTZ morning of the test. The morning of the test - take Metoprolol '25mg'$  (one tablet) instead of a half tablet      After the Test: Drink plenty of water. After receiving IV contrast, you may experience a mild flushed feeling. This is normal. On occasion, you may experience a mild rash up to 24 hours after the test. This is not dangerous. If this occurs, you can take Benadryl 25 mg and increase your fluid intake. If you experience trouble breathing, this can be serious. If it is severe call 911 IMMEDIATELY. If it is mild, please call our office. If you take any of these medications: Glipizide/Metformin, Avandament, Glucavance, please do not take 48 hours after completing test unless otherwise instructed.  Please allow 2-4 weeks for scheduling of routine cardiac CTs. Some insurance companies require a pre-authorization which may delay scheduling of this test.   For non-scheduling related questions, please contact the cardiac imaging nurse navigator should you have any questions/concerns: Marchia Bond, Cardiac Imaging Nurse Navigator Gordy Clement, Cardiac Imaging Nurse Navigator [facility] Direct Office Dial: [phone]   For scheduling needs, including cancellations and rescheduling, please call [place], [phone].   Gastroesophageal Reflux Disease, Adult Gastroesophageal reflux (GER) happens when acid from the stomach flows up into the tube that connects the mouth and the stomach (esophagus). Normally, food travels down the esophagus and stays in the stomach to be digested. With GER, food and stomach acid sometimes move back up into the esophagus. You may have a disease called gastroesophageal reflux disease (GERD) if the reflux: Happens often. Causes frequent or very bad symptoms. Causes problems such as damage to the esophagus. When this happens, the esophagus becomes sore and swollen. Over time, GERD can make  small holes (ulcers) in the lining of the esophagus. What are the causes? This condition is caused by a problem with the muscle between the esophagus and the stomach. When this muscle is weak or not normal, it does not close properly to keep food and acid from coming back up from the stomach. The muscle can be weak because of: Tobacco use. Pregnancy. Having a certain type of hernia (hiatal hernia). Alcohol use. Certain foods and drinks, such as coffee, chocolate, onions, and peppermint. What increases the risk? Being overweight. Having a disease that affects your connective tissue. Taking NSAIDs, such a ibuprofen. What are the signs or symptoms? Heartburn. Difficult or painful swallowing. The feeling of having a lump in the throat. A bitter taste in the mouth. Bad breath. Having a lot of saliva. Having an upset or bloated stomach. Burping. Chest pain. Different conditions can cause chest pain. Make sure you see your doctor if you have chest pain. Shortness of breath or wheezing. A long-term cough or a cough at night. Wearing away of the surface of teeth (tooth enamel). Weight loss. How is this treated? Making changes to your diet. Taking medicine. Having surgery. Treatment will depend on how bad your symptoms are. Follow these instructions at home: Eating and  drinking  Follow a diet as told by your doctor. You may need to avoid foods and drinks such as: Coffee and tea, with or without caffeine. Drinks that contain alcohol. Energy drinks and sports drinks. Bubbly (carbonated) drinks or sodas. Chocolate and cocoa. Peppermint and mint flavorings. Garlic and onions. Horseradish. Spicy and acidic foods. These include peppers, chili powder, curry powder, vinegar, hot sauces, and BBQ sauce. Citrus fruit juices and citrus fruits, such as oranges, lemons, and limes. Tomato-based foods. These include red sauce, chili, salsa, and pizza with red sauce. Fried and fatty foods. These  include donuts, french fries, potato chips, and high-fat dressings. High-fat meats. These include hot dogs, rib eye steak, sausage, ham, and bacon. High-fat dairy items, such as whole milk, butter, and cream cheese. Eat small meals often. Avoid eating large meals. Avoid drinking large amounts of liquid with your meals. Avoid eating meals during the 2-3 hours before bedtime. Avoid lying down right after you eat. Do not exercise right after you eat. Lifestyle  Do not smoke or use any products that contain nicotine or tobacco. If you need help quitting, ask your doctor. Try to lower your stress. If you need help doing this, ask your doctor. If you are overweight, lose an amount of weight that is healthy for you. Ask your doctor about a safe weight loss goal. General instructions Pay attention to any changes in your symptoms. Take over-the-counter and prescription medicines only as told by your doctor. Do not take aspirin, ibuprofen, or other NSAIDs unless your doctor says it is okay. Wear loose clothes. Do not wear anything tight around your waist. Raise (elevate) the head of your bed about 6 inches (15 cm). You may need to use a wedge to do this. Avoid bending over if this makes your symptoms worse. Keep all follow-up visits. Contact a doctor if: You have new symptoms. You lose weight and you do not know why. You have trouble swallowing or it hurts to swallow. You have wheezing or a cough that keeps happening. You have a hoarse voice. Your symptoms do not get better with treatment. Get help right away if: You have sudden pain in your arms, neck, jaw, teeth, or back. You suddenly feel sweaty, dizzy, or light-headed. You have chest pain or shortness of breath. You vomit and the vomit is green, yellow, or black, or it looks like blood or coffee grounds. You faint. Your poop (stool) is red, bloody, or black. You cannot swallow, drink, or eat. These symptoms may represent a serious  problem that is an emergency. Do not wait to see if the symptoms will go away. Get medical help right away. Call your local emergency services (911 in the [place] Do not drive yourself to the hospital. Summary If a person has gastroesophageal reflux disease (GERD), food and stomach acid move back up into the esophagus and cause symptoms or problems such as damage to the esophagus. Treatment will depend on how bad your symptoms are. Follow a diet as told by your doctor. Take all medicines only as told by your doctor. This information is not intended to replace advice given to you by your health care provider. Make sure you discuss any questions you have with your health care provider. Document Revised: 02/23/2020 Document Reviewed: 02/23/2020 Elsevier Patient Education  [place].

## 2021-05-14 ENCOUNTER — Encounter (HOSPITAL_BASED_OUTPATIENT_CLINIC_OR_DEPARTMENT_OTHER): Payer: Self-pay | Admitting: Family

## 2021-05-16 ENCOUNTER — Other Ambulatory Visit: Payer: Self-pay | Admitting: Family

## 2021-05-16 DIAGNOSIS — Z7689 Persons encountering health services in other specified circumstances: Secondary | ICD-10-CM | POA: Diagnosis not present

## 2021-05-17 LAB — CBC
Hematocrit: 42.8 % (ref 37.5–51.0)
Hemoglobin: 15.1 g/dL (ref 13.0–17.7)
MCH: 32.5 pg (ref 26.6–33.0)
MCHC: 35.3 g/dL (ref 31.5–35.7)
MCV: 92 fL (ref 79–97)
Platelets: 169 10*3/uL (ref 150–450)
RBC: 4.65 x10E6/uL (ref 4.14–5.80)
RDW: 13 % (ref 11.6–15.4)
WBC: 7.4 10*3/uL (ref 3.4–10.8)

## 2021-05-17 LAB — BASIC METABOLIC PANEL
BUN/Creatinine Ratio: 14 (ref 10–24)
BUN: 17 mg/dL (ref 8–27)
CO2: 24 mmol/L (ref 20–29)
Calcium: 9 mg/dL (ref 8.6–10.2)
Chloride: 101 mmol/L (ref 96–106)
Creatinine, Ser: 1.22 mg/dL (ref 0.76–1.27)
Glucose: 139 mg/dL — ABNORMAL HIGH (ref 65–99)
Potassium: 3.7 mmol/L (ref 3.5–5.2)
Sodium: 140 mmol/L (ref 134–144)
eGFR: 60 mL/min/{1.73_m2} (ref >59)

## 2021-05-18 DIAGNOSIS — R2232 Localized swelling, mass and lump, left upper limb: Secondary | ICD-10-CM | POA: Diagnosis not present

## 2021-05-18 DIAGNOSIS — Z4789 Encounter for other orthopedic aftercare: Secondary | ICD-10-CM | POA: Diagnosis not present

## 2021-05-19 ENCOUNTER — Telehealth (HOSPITAL_COMMUNITY): Payer: Self-pay | Admitting: Emergency Medicine

## 2021-05-19 NOTE — Telephone Encounter (Signed)
Reaching out to patient to offer assistance regarding upcoming cardiac imaging study; pt verbalizes understanding of appt date/time, parking situation and where to check in, pre-test NPO status and medications ordered, and verified current allergies; name and call back number provided for further questions should they arise Marchia Bond RN Navigator Cardiac Imaging [facility] [phone] office [phone] cell  Daily meds

## 2021-05-20 ENCOUNTER — Other Ambulatory Visit: Payer: Self-pay

## 2021-05-20 ENCOUNTER — Other Ambulatory Visit: Payer: Self-pay | Admitting: Cardiology

## 2021-05-20 ENCOUNTER — Ambulatory Visit (HOSPITAL_COMMUNITY)
Admission: RE | Admit: 2021-05-20 | Discharge: 2021-05-20 | Disposition: A | Discharge status: Home / Self Care | Payer: PPO | Source: Ambulatory Visit | Attending: Family | Admitting: Family

## 2021-05-20 DIAGNOSIS — I2584 Coronary atherosclerosis due to calcified coronary lesion: Secondary | ICD-10-CM | POA: Diagnosis not present

## 2021-05-20 DIAGNOSIS — I2510 Atherosclerotic heart disease of native coronary artery without angina pectoris: Secondary | ICD-10-CM | POA: Diagnosis not present

## 2021-05-20 DIAGNOSIS — R079 Chest pain, unspecified: Secondary | ICD-10-CM | POA: Diagnosis not present

## 2021-05-20 DIAGNOSIS — R931 Abnormal findings on diagnostic imaging of heart and coronary circulation: Secondary | ICD-10-CM

## 2021-05-20 MED ORDER — NITROGLYCERIN 0.4 MG SL SUBL
SUBLINGUAL_TABLET | SUBLINGUAL | Status: AC
  Filled 2021-05-20: qty 2

## 2021-05-20 MED ORDER — IOHEXOL 350 MG/ML SOLN
100.0000 mL | Freq: Once | INTRAVENOUS | Status: AC | PRN
  Administered 2021-05-20: 100 mL via INTRAVENOUS

## 2021-05-20 MED ORDER — NITROGLYCERIN 0.4 MG SL SUBL
0.8000 mg | SUBLINGUAL_TABLET | Freq: Once | SUBLINGUAL | Status: AC
  Administered 2021-05-20: 0.8 mg via SUBLINGUAL

## 2021-05-21 ENCOUNTER — Other Ambulatory Visit: Payer: Self-pay | Admitting: Cardiology

## 2021-05-23 ENCOUNTER — Other Ambulatory Visit: Payer: Self-pay

## 2021-05-23 ENCOUNTER — Ambulatory Visit (INDEPENDENT_AMBULATORY_CARE_PROVIDER_SITE_OTHER): Payer: PPO

## 2021-05-23 ENCOUNTER — Telehealth (HOSPITAL_BASED_OUTPATIENT_CLINIC_OR_DEPARTMENT_OTHER): Payer: Self-pay | Admitting: Family

## 2021-05-23 ENCOUNTER — Ambulatory Visit (HOSPITAL_COMMUNITY)
Admission: RE | Admit: 2021-05-23 | Discharge: 2021-05-23 | Disposition: A | Discharge status: Home / Self Care | Payer: PPO | Source: Ambulatory Visit | Attending: Cardiology | Admitting: Cardiology

## 2021-05-23 DIAGNOSIS — Z7901 Long term (current) use of anticoagulants: Secondary | ICD-10-CM | POA: Diagnosis not present

## 2021-05-23 DIAGNOSIS — I480 Paroxysmal atrial fibrillation: Secondary | ICD-10-CM

## 2021-05-23 DIAGNOSIS — I2510 Atherosclerotic heart disease of native coronary artery without angina pectoris: Secondary | ICD-10-CM | POA: Diagnosis not present

## 2021-05-23 DIAGNOSIS — I2584 Coronary atherosclerosis due to calcified coronary lesion: Secondary | ICD-10-CM

## 2021-05-23 DIAGNOSIS — R931 Abnormal findings on diagnostic imaging of heart and coronary circulation: Secondary | ICD-10-CM | POA: Diagnosis not present

## 2021-05-23 DIAGNOSIS — I10 Essential (primary) hypertension: Secondary | ICD-10-CM

## 2021-05-23 LAB — ECHOCARDIOGRAM COMPLETE
Area-P 1/2: 2.48 cm2
Calc EF: 44.5 %
P 1/2 time: 604 msec
S' Lateral: 3.83 cm
Single Plane A2C EF: 50.4 %
Single Plane A4C EF: 38.1 %

## 2021-05-23 MED ORDER — ROSUVASTATIN CALCIUM 20 MG PO TABS: 20.0000 mg | ORAL_TABLET | Freq: Every day | ORAL | 3 refills | Status: DC

## 2021-05-23 NOTE — Addendum Note (Signed)
Addended by: Stephani Police on: 05/23/2021 04:44 PM   Modules accepted: Orders

## 2021-05-23 NOTE — Telephone Encounter (Signed)
Spoke with pt and he verbalized understanding of his CT results and agreed to switch to Rosuvastatin... he will discuss further with Dr. Burt Knack at his 05/24/21 appt.

## 2021-05-23 NOTE — Telephone Encounter (Signed)
Called to review cardiac CTA results. Left VM requesting call back 05/23/21 at 1:30 PM.   Cardiac CTA showed multivessel coronary disease with concern for moderate to severe mid LAD stenosis and moderate mid circumflex stenosis.  Additional information was obtained with FFR.  Discussed with Dr. Marlou Porch who read the study.  FFR was unremarkable.  This tells [place] that there is plaque and calcification in the heart vessels but no severe restriction of blood flow.  Dr. Marlou Porch recommended medical management. Consider cardiac catheterization if persistent symptoms (chest pain, dyspnea) at upcoming follow up.   Most recent cholesterol panel 11/09/2020 with total cholesterol 192, LDL 130, HDL 37.  LDL goal of less than 70.  He is presently taking atorvastatin 10 mg daily and recommend transition to Rosuvastatin 20mg  daily.   Will route to triage team in case he calls back while I am seeing a patient so they can review with him.   Kevin Dubonnet, NP

## 2021-05-23 NOTE — Telephone Encounter (Signed)
Patient is returning call.  °

## 2021-05-24 ENCOUNTER — Encounter: Payer: Self-pay | Admitting: Cardiovascular Disease

## 2021-05-24 ENCOUNTER — Ambulatory Visit: Payer: PPO | Admitting: Cardiovascular Disease

## 2021-05-24 VITALS — BP 134/70 | HR 61 | Ht 72.0 in | Wt 205.8 lb

## 2021-05-24 DIAGNOSIS — I25118 Atherosclerotic heart disease of native coronary artery with other forms of angina pectoris: Secondary | ICD-10-CM | POA: Diagnosis not present

## 2021-05-24 DIAGNOSIS — I712 Thoracic aortic aneurysm, without rupture: Secondary | ICD-10-CM | POA: Diagnosis not present

## 2021-05-24 DIAGNOSIS — E041 Nontoxic single thyroid nodule: Secondary | ICD-10-CM | POA: Diagnosis not present

## 2021-05-24 DIAGNOSIS — D6869 Other thrombophilia: Secondary | ICD-10-CM | POA: Diagnosis not present

## 2021-05-24 DIAGNOSIS — I10 Essential (primary) hypertension: Secondary | ICD-10-CM | POA: Diagnosis not present

## 2021-05-24 DIAGNOSIS — I4891 Unspecified atrial fibrillation: Secondary | ICD-10-CM | POA: Diagnosis not present

## 2021-05-24 DIAGNOSIS — I700 Atherosclerosis of aorta: Secondary | ICD-10-CM | POA: Diagnosis not present

## 2021-05-24 DIAGNOSIS — E876 Hypokalemia: Secondary | ICD-10-CM | POA: Diagnosis not present

## 2021-05-24 DIAGNOSIS — J439 Emphysema, unspecified: Secondary | ICD-10-CM | POA: Diagnosis not present

## 2021-05-24 DIAGNOSIS — I428 Other cardiomyopathies: Secondary | ICD-10-CM

## 2021-05-24 DIAGNOSIS — I25119 Atherosclerotic heart disease of native coronary artery with unspecified angina pectoris: Secondary | ICD-10-CM

## 2021-05-24 DIAGNOSIS — Z7189 Other specified counseling: Secondary | ICD-10-CM | POA: Diagnosis not present

## 2021-05-24 DIAGNOSIS — I480 Paroxysmal atrial fibrillation: Secondary | ICD-10-CM

## 2021-05-24 DIAGNOSIS — D692 Other nonthrombocytopenic purpura: Secondary | ICD-10-CM | POA: Diagnosis not present

## 2021-05-24 MED ORDER — ENTRESTO 24-26 MG PO TABS: 1.0000 | ORAL_TABLET | Freq: Two times a day (BID) | ORAL | 3 refills | Status: DC

## 2021-05-24 NOTE — Progress Notes (Signed)
Cardiology Office Note:    Date:  05/24/2021   ID:  Kevin Day, DOB 1941-06-03, MRN [id number]  PCP:  Kevin Margarita, DO   [facility] Providers Cardiologist:  Kevin Mocha, MD     Referring MD: Kevin Margarita, DO   Chief Complaint  Patient presents with   Atrial Fibrillation    History of Present Illness:    Kevin Day is a 80 y.o. male with a hx of mixed hyperlipidemia, aortic atherosclerosis with dilated aortic root, hypertension, and atrial fibrillation, presenting for follow-up evaluation.  He was recently diagnosed with atrial fibrillation in August 2022.  He was started on oral anticoagulation with apixaban and metoprolol was initiated.  He was seen in follow-up 913 and 916 when he was noted to be in sinus rhythm.  At one of his recent evaluations, he had some burning chest discomfort and an ischemic evaluation was recommended.  A cardiac CTA was performed and demonstrated moderately severe mid LAD stenosis with negative FFR evaluation except for the apical portion of the LAD.  Initial medical therapy was recommended.  An echocardiogram was also performed which demonstrated mild global LV dysfunction with LVEF 45 to 50%, moderate aortic stenosis, and no other significant abnormalities.  He presents today for follow-up evaluation.  The patient is here with his wife and daughter today.  He is doing pretty well.  He did have some episodes of chest tightness but these have resolved on his current medications.  He denies shortness of breath, edema, or recurrence of heart palpitations.  He denies lightheadedness, orthopnea, or PND.  Past Medical History:  Diagnosis Date   Arthritis    Cataracts, bilateral    Hyperlipidemia    Hypertension    Prostate cancer ([place])    Prostate cancer [facility])    Prostate cancer ([place])    Ruptured lumbar disc    Skin cancer     Past Surgical History:  Procedure Laterality Date   BACK SURGERY     cataract Bilateral    COLONOSCOPY   10/09/2016   POLYPECTOMY     radioactive      radioactive seed implants for prostate CA.     Current Medications: Current Meds  Medication Sig   apixaban (ELIQUIS) 5 MG TABS tablet Take 1 tablet (5 mg total) by mouth 2 (two) times daily.   Cholecalciferol (VITAMIN D3) 50 MCG (2000 UT) TABS Take 2,000 Units by mouth at bedtime.   metoprolol tartrate (LOPRESSOR) 25 MG tablet Take 0.5 tablets (12.5 mg total) by mouth 2 (two) times daily.   Multiple Vitamins-Minerals (OCUVITE PO) Take 1 tablet by mouth at bedtime.   pantoprazole (PROTONIX) 20 MG tablet Take 1 tablet (20 mg total) by mouth daily.   potassium chloride (KLOR-CON) 10 MEQ tablet Take 2 tablets (20 mEq total) by mouth daily.   rosuvastatin (CRESTOR) 20 MG tablet Take 1 tablet (20 mg total) by mouth daily.   sacubitril-valsartan (ENTRESTO) 24-26 MG Take 1 tablet by mouth 2 (two) times daily.   triamterene-hydrochlorothiazide (MAXZIDE-25) 37.5-25 MG tablet Take 1 tablet by mouth daily.   [DISCONTINUED] amLODipine (NORVASC) 10 MG tablet Take 10 mg by mouth daily.     Allergies:   Patient has no known allergies.   Social History   Socioeconomic History   Marital status: Married    Spouse name: Not on file   Number of children: Not on file   Years of education: Not on file   Highest education level: Not on file  Occupational History   Not on file  Tobacco Use   Smoking status: Former   Smokeless tobacco: Never   Tobacco comments:    Former smoker 05/10/2021  Vaping Use   Vaping Use: Never used  Substance and Sexual Activity   Alcohol use: No   Drug use: No   Sexual activity: Not on file  Other Topics Concern   Not on file  Social History Narrative   Not on file   Social Determinants of Health   Financial Resource Strain: Not on file  Food Insecurity: Not on file  Transportation Needs: Not on file  Physical Activity: Not on file  Stress: Not on file  Social Connections: Not on file     Family History: The  patient's family history includes Breast cancer in his sister. There is no history of Colon cancer, Esophageal cancer, Rectal cancer, Stomach cancer, or Colon polyps.  ROS:   Please see the history of present illness.    All other systems reviewed and are negative.  EKGs/Labs/Other Studies Reviewed:    The following studies were reviewed today: Coronary CTA: Aorta: Normal size. (39 mm ascending). Aortic atherosclerosis. No dissection.   Aortic Valve: Mild calcifications.   Coronary Arteries:  Normal coronary origin.  Right dominance.   RCA is a large dominant artery that gives rise to PDA and PLA. There is calcified proximal plaque, 0-24%.   Left main is a large artery that gives rise to LAD and LCX arteries. There is ostial calcified plaque 0-24% stenosis.   LAD is a large vessel that has diffuse calcified plaque, stenosis of 50-74% post small first diagonal branch   LCX is a non-dominant artery that gives rise to one large OM1 branch. There is proximal calcified scattered plaque, 0-24% stenosis. There is mid circumflex 25-49% stenosis.   Other findings:   Normal pulmonary vein drainage into the left atrium.   Normal left atrial appendage without a thrombus.   Normal size of the pulmonary artery.   Please see radiology report for non cardiac findings.   IMPRESSION: 1. Coronary calcium score of 972 (LM 101, LAD 712, LCX 82, RCA 78). This was 63 percentile for age and sex matched control.   2. Normal coronary origin with right dominance.   3. Multivessel CAD with moderate to severe mid LAD stenosis, moderate mid circumflex. Sending for FFR.   4.  Aortic atherosclerosis.  CT-FFR: 1. Left Main: No significant stenosis. 0.96 to 0.88 mid post lesion. Diagonal is too small to quantify. Distal LAD 0.76 small caliber vessel.   2. LAD: No significant stenosis. 3. LCX: No significant stenosis. 4. RCA: No significant stenosis.   IMPRESSION: 1.  CT FFR analysis didn't  show any significant stenosis.  (LAD).   2. Recommend continued medical management - if symptoms worsen or become more worrisome, consider cardiac catheterization.  2D echocardiogram 05/23/2021: IMPRESSIONS     1. Left ventricular ejection fraction, by estimation, is 45 to 50%. The  left ventricle has mildly decreased function. The left ventricle  demonstrates global hypokinesis. Left ventricular diastolic parameters are  consistent with Grade I diastolic  dysfunction (impaired relaxation).   2. Right ventricular systolic function is normal. The right ventricular  size is normal.   3. Left atrial size was mildly dilated.   4. The mitral valve is normal in structure. Mild mitral valve  regurgitation. No evidence of mitral stenosis.   5. The aortic valve is tricuspid. There is moderate calcification of the  aortic  valve. There is moderate thickening of the aortic valve. Aortic  valve regurgitation is mild to moderate. Moderate aortic valve stenosis.   6. Aortic dilatation noted. There is mild dilatation of the ascending  aorta, measuring 41 mm.   7. The inferior vena cava is normal in size with greater than 50%  respiratory variability, suggesting right atrial pressure of 3 mmHg.   Comparison(s): Prior images reviewed side by side. The left ventricular  function is worsened. EF 55-60%; There is mild dilatation of the aortic  root and of the ascending aorta measuring 41 mm and 65mm respectively.   Recent Labs: 04/01/2021: ALT 16; B Natriuretic Peptide 132.0 05/16/2021: BUN 17; Creatinine, Ser 1.22; Hemoglobin 15.1; Platelets 169; Potassium 3.7; Sodium 140  Recent Lipid Panel    Component Value Date/Time   CHOL 142 05/30/2019 0750   TRIG 151 (H) 05/30/2019 0750   HDL 39 (L) 05/30/2019 0750   CHOLHDL 3.6 05/30/2019 0750   LDLCALC 77 05/30/2019 0750     Risk Assessment/Calculations:    CHA2DS2-VASc Score = 4   This indicates a 4.8% annual risk of stroke. The patient's score is  based upon: CHF History: 0 HTN History: 1 Diabetes History: 0 Stroke History: 0 Vascular Disease History: 1 (aortic atherosclerosis) Age Score: 2 Gender Score: 0          Physical Exam:    VS:  BP 134/70   Pulse 61   Ht 6' (1.829 m)   Wt 205 lb 12.8 oz (93.4 kg)   SpO2 98%   BMI 27.91 kg/m     Wt Readings from Last 3 Encounters:  05/24/21 205 lb 12.8 oz (93.4 kg)  05/13/21 204 lb 12.8 oz (92.9 kg)  05/10/21 204 lb (92.5 kg)     GEN:  Well nourished, well developed in no acute distress HEENT: Normal NECK: No JVD; No carotid bruits LYMPHATICS: No lymphadenopathy CARDIAC: RRR, no murmurs, rubs, gallops RESPIRATORY:  Clear to auscultation without rales, wheezing or rhonchi  ABDOMEN: Soft, non-tender, non-distended MUSCULOSKELETAL:  No edema; No deformity  SKIN: Warm and dry NEUROLOGIC:  Alert and oriented x 3 PSYCHIATRIC:  Normal affect   ASSESSMENT:    1. Paroxysmal atrial fibrillation (HCC)   2. Nonischemic cardiomyopathy ([place])   3. Coronary artery disease involving native coronary artery of native heart with angina pectoris ([place])   4. Essential hypertension    PLAN:    In order of problems listed above:  The patient is appropriately anticoagulated with apixaban.  He has no bleeding problems at present.  Appears to be maintaining sinus rhythm on the low-dose of metoprolol. He has a new cardiomyopathy identified on his echo study.  It is possible that this is tachycardia mediated with recent atrial fibrillation.  He is gated coronary CTA is reviewed and he does not have enough obstructive coronary artery disease to explain his cardiomyopathy.  We will adjust his medical management of hypertension.  I have asked him to discontinue amlodipine and start Entresto 24/26 mg twice daily.  He will be scheduled for a follow-up metabolic panel in a few weeks and he has a follow-up appointment with Laurann Montana set in about 1 month.  Further titration of medications might be  appropriate with increasing Entresto or his beta-blocker as tolerated at follow-up.  I would anticipate a repeat echocardiogram in about 6 months. Reviewed gated coronary CT with the patient and his family members.  I showed them the CT-FFR images.  I do not think he  has any high-grade obstructive coronary disease.  The only reduction in FFR is seen in the very apical portion of the LAD which is generally not a clinically significant finding.  He will continue with medical therapy which includes a high intensity statin drug and a beta-blocker. He will continue on triamterene/HCTZ, low-dose metoprolol, and he is started on Entresto with discontinuation of amlodipine as above.   Medication Adjustments/Labs and Tests Ordered: Current medicines are reviewed at length with the patient today.  Concerns regarding medicines are outlined above.  Orders Placed This Encounter  Procedures   Basic metabolic panel   Meds ordered this encounter  Medications   sacubitril-valsartan (ENTRESTO) 24-26 MG    Sig: Take 1 tablet by mouth 2 (two) times daily.    Dispense:  60 tablet    Refill:  3    Please Honor Card patient is presenting for Carmie Kanner: [id number]; Juanna Cao: [phone]; WKMQK: [id number]; MMNO: [id number]    Patient Instructions  Medication Instructions:   STOP TAKING AMLODIPINE NOW  START TAKING ENTRESTO 24/26 MG TABLETS--TAKE ONE TABLET BY MOUTH TWICE DAILY  *If you need a refill on your cardiac medications before your next appointment, please call your pharmacy*   Lab Work:  IN 7-10 DAYS HERE IN THE OFFICE--BMET  If you have labs (blood work) drawn today and your tests are completely normal, you will receive your results only by: [place] (if you have MyChart) OR A paper copy in the mail If you have any lab test that is abnormal or we need to change your treatment, we will call you to review the results.   Follow-Up:  WITH Laurann Montana NP AS SCHEDULED ON June 24 2021 AT 9  AM   WITH DR. Burt Knack IN 6 MONTHS--PER DR. Burt Knack OKAY TO ADD PATIENT ON 11/22/21 AT THE 9:20 AM SLOT OR 9:40 AM SLOT       Deatra Mitcheal, MD  05/24/2021 6:11 PM    [facility]

## 2021-05-24 NOTE — Patient Instructions (Signed)
Medication Instructions:   STOP TAKING AMLODIPINE NOW  START TAKING ENTRESTO 24/26 MG TABLETS--TAKE ONE TABLET BY MOUTH TWICE DAILY  *If you need a refill on your cardiac medications before your next appointment, please call your pharmacy*   Lab Work:  IN 7-10 [place] OFFICE--BMET  If you have labs (blood work) drawn today and your tests are completely normal, you will receive your results only by: [place] (if you have MyChart) OR A paper copy in the mail If you have any lab test that is abnormal or we need to change your treatment, we will call you to review the results.   Follow-Up:  WITH Laurann Montana NP AS SCHEDULED ON June 24 2021 AT 9 AM   WITH DR. Burt Knack IN 6 MONTHS--PER DR. Burt Knack OKAY TO ADD PATIENT ON 11/22/21 AT THE 9:20 AM SLOT OR 9:40 AM SLOT

## 2021-06-03 ENCOUNTER — Other Ambulatory Visit: Payer: PPO | Admitting: *Deleted

## 2021-06-03 ENCOUNTER — Other Ambulatory Visit: Payer: Self-pay

## 2021-06-03 DIAGNOSIS — I428 Other cardiomyopathies: Secondary | ICD-10-CM | POA: Diagnosis not present

## 2021-06-03 DIAGNOSIS — I480 Paroxysmal atrial fibrillation: Secondary | ICD-10-CM

## 2021-06-03 LAB — BASIC METABOLIC PANEL
BUN/Creatinine Ratio: 14 (ref 10–24)
BUN: 19 mg/dL (ref 8–27)
CO2: 22 mmol/L (ref 20–29)
Calcium: 8.6 mg/dL (ref 8.6–10.2)
Chloride: 102 mmol/L (ref 96–106)
Creatinine, Ser: 1.38 mg/dL — ABNORMAL HIGH (ref 0.76–1.27)
Glucose: 91 mg/dL (ref 70–99)
Potassium: 3.7 mmol/L (ref 3.5–5.2)
Sodium: 140 mmol/L (ref 134–144)
eGFR: 52 mL/min/{1.73_m2} — ABNORMAL LOW (ref >59)

## 2021-06-07 ENCOUNTER — Telehealth: Payer: Self-pay | Admitting: Cardiovascular Disease

## 2021-06-07 NOTE — Telephone Encounter (Signed)
Follow Up:      Patient received his lab results on My Chart, he needs somebody to please explain it and go over it with him,

## 2021-06-07 NOTE — Telephone Encounter (Signed)
Advised that the MD has not reviewed labs yet. Patient will be notified with results after MD updates.  Verbalized understanding.

## 2021-06-09 ENCOUNTER — Other Ambulatory Visit (HOSPITAL_BASED_OUTPATIENT_CLINIC_OR_DEPARTMENT_OTHER): Payer: Self-pay | Admitting: Family

## 2021-06-09 DIAGNOSIS — K219 Gastro-esophageal reflux disease without esophagitis: Secondary | ICD-10-CM

## 2021-06-09 NOTE — Telephone Encounter (Signed)
Rx(s) sent to pharmacy electronically.  

## 2021-06-24 ENCOUNTER — Other Ambulatory Visit: Payer: Self-pay

## 2021-06-24 ENCOUNTER — Ambulatory Visit (HOSPITAL_BASED_OUTPATIENT_CLINIC_OR_DEPARTMENT_OTHER): Payer: PPO | Admitting: Family

## 2021-06-24 ENCOUNTER — Encounter (HOSPITAL_BASED_OUTPATIENT_CLINIC_OR_DEPARTMENT_OTHER): Payer: Self-pay | Admitting: Family

## 2021-06-24 VITALS — BP 110/60 | HR 63 | Ht 72.0 in | Wt 206.2 lb

## 2021-06-24 DIAGNOSIS — I77810 Thoracic aortic ectasia: Secondary | ICD-10-CM

## 2021-06-24 DIAGNOSIS — I480 Paroxysmal atrial fibrillation: Secondary | ICD-10-CM | POA: Diagnosis not present

## 2021-06-24 DIAGNOSIS — K219 Gastro-esophageal reflux disease without esophagitis: Secondary | ICD-10-CM | POA: Diagnosis not present

## 2021-06-24 DIAGNOSIS — I5022 Chronic systolic (congestive) heart failure: Secondary | ICD-10-CM | POA: Diagnosis not present

## 2021-06-24 DIAGNOSIS — I25118 Atherosclerotic heart disease of native coronary artery with other forms of angina pectoris: Secondary | ICD-10-CM | POA: Diagnosis not present

## 2021-06-24 DIAGNOSIS — I4891 Unspecified atrial fibrillation: Secondary | ICD-10-CM | POA: Diagnosis not present

## 2021-06-24 DIAGNOSIS — I350 Nonrheumatic aortic (valve) stenosis: Secondary | ICD-10-CM | POA: Diagnosis not present

## 2021-06-24 DIAGNOSIS — D6869 Other thrombophilia: Secondary | ICD-10-CM

## 2021-06-24 NOTE — Patient Instructions (Signed)
Medication Instructions:   Please ensure you have STOPPED Amlodipine and STARTED Entresto 24-26mg  one tablet twice daily. This change was made to help strengthen your heart pumping function.   If you have not made these medications, please call and let us know.   If your diarrhea does not improve by adding some fiber to your diet please let us know. This is not a common side effect of Entresto but if it persists we can consider adjusting medications.   *If you need a refill on your cardiac medications before your next appointment, please call your pharmacy*  Lab Work: None ordered today.   Testing/Procedures: None ordered today.   Follow-Up: At [facility], you and your health needs are our priority.  As part of our continuing mission to provide you with exceptional heart care, we have created designated Provider Care Teams.  These Care Teams include your primary Cardiologist (physician) and Advanced Practice Providers (APPs -  Physician Assistants and Nurse Practitioners) who all work together to provide you with the care you need, when you need it.  We recommend signing up for the patient portal called "MyChart".  Sign up information is provided on this After Visit Summary.  MyChart is used to connect with patients for Virtual Visits (Telemedicine).  Patients are able to view lab/test results, encounter notes, upcoming appointments, etc.  Non-urgent messages can be sent to your provider as well.   To learn more about what you can do with MyChart, go to [email or web address].    Your next appointment:   As scheduled with Dr. Burt Knack   Other Instructions   Fiber Content in Foods Fiber is a substance that is found in plant foods, such as fruits, vegetables, whole grains, nuts, seeds, and beans. As part of your treatment and recovery plan, your health care provider may recommend that you eat foods that have specific amounts of dietary fiber. Some conditions may require a high-fiber  diet while others may require a low-fiber diet. This sheet gives you information about the dietary fiber content of some common foods. Your health care provider will tell you how much fiber you need in your diet. If you have problems or questions, contact your health care provider or dietitian. What foods are high in fiber? Fruits Blackberries or raspberries (fresh) --  cup (75 g) has 4 g of fiber. Pear (fresh) -- 1 medium (180 g) has 5.5 g of fiber. Prunes (dried) -- 6 to 8 pieces (57-76 g) has 5 g of fiber. Apple with skin -- 1 medium (182 g) has 4.8 g of fiber. Guava -- 1 cup (128 g) has 8.9 g of fiber. Vegetables Peas (frozen) --  cup (80 g) has 4.4 g of fiber. Potato with skin (baked) -- 1 medium (173 g) has 4.4 g of fiber. Pumpkin (canned) --  cup (122 g) has 5 g of fiber. Brussels sprouts (cooked) --  cup (78 g) has 4 g of fiber. Sweet potato --  cup mashed (124 g) has 4 g of fiber. Winter squash -- 1 cup cooked (205 g) has 5.7 g of fiber. Grains Bran cereal --  cup (31 g) has 8.6 g of fiber. Bulgur (cooked) --  cup (70 g) has 4 g of fiber. Quinoa (cooked) -- 1 cup (185 g) has 5.2 g of fiber. Popcorn -- 3 cups (375 g) popped has 5.8 g of fiber. Spaghetti, whole wheat -- 1 cup (140 g) has 6 g of fiber. Meats and other proteins Pinto beans (cooked) --  cup (90 g) has 7.7 g of fiber. Lentils (cooked) --  cup (90 g) has 7.8 g of fiber. Kidney beans (canned) --  cup (92.5 g) has 5.7 g of fiber. Soybeans (canned, frozen, or fresh) --  cup (92.5 g) has 5.2 g of fiber. Baked beans, plain or vegetarian (canned) --  cup (130 g) has 5.2 g of fiber. Garbanzo beans or chickpeas (canned) --  cup (90 g) has 6.6 g of fiber. Black beans (cooked) --  cup (86 g) has 7.5 g of fiber. White beans or navy beans (cooked) --  cup (91 g) has 9.3 g of fiber. The items listed above may not be a complete list of foods with high fiber. Actual amounts of fiber may be different depending on  processing. Contact a dietitian for more information. What foods are moderate in fiber? Fruits Banana -- 1 medium (126 g) has 3.2 g of fiber. Melon -- 1 cup (155 g) has 1.4 g of fiber. Orange -- 1 small (154 g) has 3.7 g of fiber. Raisins --  cup (40 g) has 1.8 g of fiber. Applesauce, sweetened --  cup (125 g) has 1.5 g of fiber. Blueberries (fresh) --  cup (75 g) has 1.8 g of fiber. Strawberries (fresh, sliced) -- 1 cup (409 g) has 3 g of fiber. Cherries -- 1 cup (140 g) has 2.9 g of fiber. Vegetables Broccoli (cooked) --  cup (77.5 g) has 2.1 g of fiber. Carrots (cooked) --  cup (77.5 g) has 2.2 g of fiber. Corn (canned or frozen) --  cup (82.5 g) has 2.1 g of fiber. Potatoes, mashed --  cup (105 g) has 1.6 g of fiber. Tomato -- 1 medium (62 g) has 1.5 g of fiber. Green beans (canned) --  cup (83 g) has 2 g of fiber. Squash, winter --  cup (58 g) has 1 g of fiber. Sweet potato, baked -- 1 medium (150 g) has 3 g of fiber. Cauliflower (cooked) -- 1/2 cup (90 g) has 2.3 g of fiber. Grains Long-grain brown rice (cooked) -- 1 cup (196 g) has 3.5 g of fiber. Bagel, plain -- one 4-inch (10 cm) bagel has 2 g of fiber. Instant oatmeal --  cup (120 g) has about 2 g of fiber. Macaroni noodles, enriched (cooked) -- 1 cup (140 g) has 2.5 g of fiber. Multigrain cereal --  cup (15 g) has about 2-4 g of fiber. Whole-wheat bread -- 1 slice (26 g) has 2 g of fiber. Whole-wheat spaghetti noodles --  cup (70 g) has 3.2 g of fiber. Corn tortilla -- one 6-inch (15 cm) tortilla has 1.5 g of fiber. Meats and other proteins Almonds --  cup or 1 oz (28 g) has 3.5 g of fiber. Sunflower seeds in shell --  cup or  oz (11.5 g) has 1.1 g of fiber. Vegetable or soy patty -- 1 patty (70 g) has 3.4 g of fiber. Walnuts --  cup or 1 oz (30 g) has 2 g of fiber. Flax seed -- 1 Tbsp (7 g) has 2.8 g of fiber. The items listed above may not be a complete list of foods that have moderate amounts of  fiber. Actual amounts of fiber may be different depending on processing. Contact a dietitian for more information. What foods are low in fiber? Low-fiber foods contain less than 1 g of fiber per serving. They include: Fruits Fruit juice --  cup or 4 fl oz (118 mL) has 0.5  g of fiber. Vegetables Lettuce -- 1 cup (35 g) has 0.5 g of fiber. Cucumber (slices) --  cup (60 g) has 0.3 g of fiber. Celery -- 1 stalk (40 g) has 0.1 g of fiber. Grains Flour tortilla -- one 6-inch (15 cm) tortilla has 0.5 g of fiber. White rice (cooked) --  cup (81.5 g) has 0.3 g of fiber. Meats and other proteins Egg -- 1 large (50 g) has 0 g of fiber. Meat, poultry, or fish -- 3 oz (85 g) has 0 g of fiber. Dairy Milk -- 1 cup or 8 fl oz (237 mL) has 0 g of fiber. Yogurt -- 1 cup (245 g) has 0 g of fiber. The items listed above may not be a complete list of foods that are low in fiber. Actual amounts of fiber may be different depending on processing. Contact a dietitian for more information. Summary Fiber is a substance that is found in plant foods, such as fruits, vegetables, whole grains, nuts, seeds, and beans. As part of your treatment and recovery plan, your health care provider may recommend that you eat foods that have specific amounts of dietary fiber. This information is not intended to replace advice given to you by your health care provider. Make sure you discuss any questions you have with your health care provider. Document Revised: 12/18/2019 Document Reviewed: 12/18/2019 Elsevier Patient Education  2022 [employer].

## 2021-06-24 NOTE — Progress Notes (Signed)
Office Visit    Patient Name: Kevin Day Date of Encounter: 06/24/2021  PCP:  Sueanne Margarita, [facility]  Cardiologist:  Sherren Mocha, MD  Advanced Practice Provider:  No care team member to display Electrophysiologist:  None    Chief Complaint    Kevin Day is a 80 y.o. male with a hx of hyperlipidemia, aortic atherosclerosis, dilated aortic root, hypertension, atrial fibrillation presents today for follow-up of heart failure.  Past Medical History    Past Medical History:  Diagnosis Date   Arthritis    Cataracts, bilateral    Hyperlipidemia    Hypertension    Prostate cancer ([place])    Prostate cancer [facility])    Prostate cancer ([place])    Ruptured lumbar disc    Skin cancer    Past Surgical History:  Procedure Laterality Date   BACK SURGERY     cataract Bilateral    COLONOSCOPY  10/09/2016   POLYPECTOMY     radioactive      radioactive seed implants for prostate CA.     Allergies  No Known Allergies  History of Present Illness    Kevin Day is a 80 y.o. male with a hx of HLD, aortic atherosclerosis, dilated arotic root, HTN, atrial fibrillation last seen by Dr. Burt Knack 05/24/21  Mr. Rentz follows with Dr. Burt Knack.  He was seen in the ED July 2020 for chest discomfort.  Lexiscan Myoview 02/2019 low risk study.  Echocardiogram 02/2019 with normal LVEF, mild AI, mild dilation of the aortic root and ascending aorta 41 mm and 42 mm respectively.  Diagnosed with atrial fibrillation 04/01/21 after presenting to ED with chest discomfort. He was started on Eliquis, Metoprolol, and KCL. Seen in atrial fib clinic 05/10/21 doing well and maintaining NSR. He did report mild central chest discomfort which did not radiate. He was recommended for general cardiology follow up.   He was seen 05/13/2021.  Given chest pain echocardiogram and cardiac CTA were recommended.  Cardiac CT showing moderate to severe mid LAD stenosis with negative FFR  evaluation except for the apical portion of the LAD.  Recommended for initial medical therapy.  Echocardiogram with LVEF 45 to 50%, moderate aortic stenosis, no other significant abnormalities.  He was seen in follow-up 05/24/2021 by Dr. Burt Knack and amlodipine was stopped and Entresto initiated.  His chest pain had resolved.  He presents today for follow-up.  Reports no chest pain, pressure, tightness.  Endorses he feels overall well.  No orthopnea, edema, PND.  Notes mild dyspnea with more than usual activity.  Reports blood pressure at home is in the 120s over 80s.  No lightheadedness, dizziness, near-syncope, ischemia.  His wife is not present during visit today and does assist with his medications.  He thinks he made the recommended medication changes and will call us if that is not the case as his medication list that he has in his wallet is out of date.  Does note to recent episodes of diarrhea and wonders if he had a viral illness.  We discussed that this would be an atypical reaction to [facility] but he will contact our office if it recurs.   EKGs/Labs/Other Studies Reviewed:   The following studies were reviewed today: Echo 05/23/21  1. Left ventricular ejection fraction, by estimation, is 45 to 50%. The  left ventricle has mildly decreased function. The left ventricle  demonstrates global hypokinesis. Left ventricular diastolic parameters are  consistent with Grade  I diastolic  dysfunction (impaired relaxation).   2. Right ventricular systolic function is normal. The right ventricular  size is normal.   3. Left atrial size was mildly dilated.   4. The mitral valve is normal in structure. Mild mitral valve  regurgitation. No evidence of mitral stenosis.   5. The aortic valve is tricuspid. There is moderate calcification of the  aortic valve. There is moderate thickening of the aortic valve. Aortic  valve regurgitation is mild to moderate. Moderate aortic valve stenosis.   6. Aortic  dilatation noted. There is mild dilatation of the ascending  aorta, measuring 41 mm.   7. The inferior vena cava is normal in size with greater than 50%  respiratory variability, suggesting right atrial pressure of 3 mmHg.  Cardiac CTA 05/20/21 IMPRESSION: 1. Coronary calcium score of 972 (LM 101, LAD 712, LCX 82, RCA 78). This was 56 percentile for age and sex matched control.   2. Normal coronary origin with right dominance.   3. Multivessel CAD with moderate to severe mid LAD stenosis, moderate mid circumflex. Sending for FFR.   4.  Aortic atherosclerosis.  1. Left Main: No significant stenosis. 0.96 to 0.88 mid post lesion. Diagonal is too small to quantify. Distal LAD 0.76 small caliber vessel.   2. LAD: No significant stenosis. 3. LCX: No significant stenosis. 4. RCA: No significant stenosis.   IMPRESSION: 1.  CT FFR analysis didn't show any significant stenosis.  (LAD).   2. Recommend continued medical management - if symptoms worsen or become more worrisome, consider cardiac catheterization.  EKG:  No EKG today.  Recent Labs: 04/01/2021: ALT 16; B Natriuretic Peptide 132.0 05/16/2021: Hemoglobin 15.1; Platelets 169 06/03/2021: BUN 19; Creatinine, Ser 1.38; Potassium 3.7; Sodium 140  Recent Lipid Panel    Component Value Date/Time   CHOL 142 05/30/2019 0750   TRIG 151 (H) 05/30/2019 0750   HDL 39 (L) 05/30/2019 0750   CHOLHDL 3.6 05/30/2019 0750   LDLCALC 77 05/30/2019 0750    Risk Assessment/Calculations:   CHA2DS2-VASc Score = 4   This indicates a 4.8% annual risk of stroke. The patient's score is based upon: CHF History: 0 HTN History: 1 Diabetes History: 0 Stroke History: 0 Vascular Disease History: 1 (aortic atherosclerosis) Age Score: 2 Gender Score: 0    Home Medications   Current Meds  Medication Sig   apixaban (ELIQUIS) 5 MG TABS tablet Take 1 tablet (5 mg total) by mouth 2 (two) times daily.   Cholecalciferol (VITAMIN D3) 50 MCG (2000 UT)  TABS Take 2,000 Units by mouth at bedtime.   Fish Oil-Cholecalciferol (OMEGA-3 FISH OIL/VITAMIN D3 PO) Take by mouth.   metoprolol tartrate (LOPRESSOR) 25 MG tablet Take 0.5 tablets (12.5 mg total) by mouth 2 (two) times daily.   Multiple Vitamins-Minerals (OCUVITE PO) Take 1 tablet by mouth at bedtime.   pantoprazole (PROTONIX) 20 MG tablet TAKE 1 TABLET(20 MG) BY MOUTH DAILY   potassium chloride (KLOR-CON) 10 MEQ tablet Take 2 tablets (20 mEq total) by mouth daily.   rosuvastatin (CRESTOR) 20 MG tablet Take 1 tablet (20 mg total) by mouth daily.   sacubitril-valsartan (ENTRESTO) 24-26 MG Take 1 tablet by mouth 2 (two) times daily.   triamterene-hydrochlorothiazide (MAXZIDE-25) 37.5-25 MG tablet Take 1 tablet by mouth daily.     Review of Systems      All other systems reviewed and are otherwise negative except as noted above.  Physical Exam    VS:  BP 110/60   Pulse  63   Ht 6' (1.829 m)   Wt 206 lb 3.2 oz (93.5 kg)   SpO2 94%   BMI 27.97 kg/m  , BMI Body mass index is 27.97 kg/m.  Wt Readings from Last 3 Encounters:  06/24/21 206 lb 3.2 oz (93.5 kg)  05/24/21 205 lb 12.8 oz (93.4 kg)  05/13/21 204 lb 12.8 oz (92.9 kg)     GEN: Well nourished, well developed, in no acute distress. HEENT: normal. Neck: Supple, no JVD, carotid bruits, or masses. Cardiac: RRR, no murmurs, rubs, or gallops. No clubbing, cyanosis, edema.  Radials/PT 2+ and equal bilaterally.  Respiratory:  Respirations regular and unlabored, clear to auscultation bilaterally. GI: Soft, nontender, nondistended. MS: No deformity or atrophy. Skin: Warm and dry, no rash. Neuro:  Strength and sensation are intact. Psych: Normal affect.  Assessment & Plan    CAD- Stable with no anginal symptoms. No indication for ischemic evaluation.  Cardiac CT with moderately severe mid LAD stenosis and negative FFR evaluation except for apical portion of LAD.  Recommended for initial medical therapy.  GDMT includes rosuvastatin,  metoprolol.  No aspirin due to chronic anticoagulation. Heart healthy diet and regular cardiovascular exercise encouraged.    Dilated aortic root and ascending aorta - Echo 05/23/21 with mild dilation of ascending aorta 54mm. Continue optimal BP control. Periodic echo for monitoring.   HFrEF / Moderate aortic stenosis - Echo 04/2021 LVEF 45-50% and moderate aortic stenosis.  At last clinic visit amlodipine discontinued and transition to Entresto 24-26 mg twice daily.  He thinks he made the suggested medication change but his wife manages his medications and he will discuss with her later today as she is not at clinic visit.  Unable to further uptitrate GDMT due to relative hypotension. As he is feeling well he prefers to avoid additional medication changes. GDMT includes Entresto, metoprolol.  No indication for loop diuretic at this time. Plan for repeat echo in 6 months which can be coordinated at follow up.   PAF / Chronic anticoagulation -maintaining NSR by auscultation today.  Denies palpitations.  Continue metoprolol titrate 12.5 mg twice daily.  Continue Eliquis 5 mg twice daily.  Does not meet dose reduction criteria. CHA2DS2-VASc Score = 4 [CHF History: 0, HTN History: 1, Diabetes History: 0, Stroke History: 0, Vascular Disease History: 1 (aortic atherosclerosis), Age Score: 2, Gender Score: 0].  Therefore, the patient's annual risk of stroke is 4.8 %.     GERD -Well controlled on PRN Protonix.   Disposition: Follow up in 5 months with Dr. Burt Knack or APP.  Signed, Loel Dubonnet, NP 06/24/2021, 8:52 AM [place]

## 2021-07-08 DIAGNOSIS — H401131 Primary open-angle glaucoma, bilateral, mild stage: Secondary | ICD-10-CM | POA: Diagnosis not present

## 2021-07-12 DIAGNOSIS — H43811 Vitreous degeneration, right eye: Secondary | ICD-10-CM | POA: Diagnosis not present

## 2021-07-12 DIAGNOSIS — H353122 Nonexudative age-related macular degeneration, left eye, intermediate dry stage: Secondary | ICD-10-CM | POA: Diagnosis not present

## 2021-07-12 DIAGNOSIS — H26493 Other secondary cataract, bilateral: Secondary | ICD-10-CM | POA: Diagnosis not present

## 2021-07-12 DIAGNOSIS — H353111 Nonexudative age-related macular degeneration, right eye, early dry stage: Secondary | ICD-10-CM | POA: Diagnosis not present

## 2021-07-12 NOTE — Progress Notes (Signed)
I agree with the history, physical, plan as documented by Alanna Salomone, PA. I was present for key portions of the history, personally evaluated the patient, and discussed the management and plan with Alanna. I was present for over half of this encounter.

## 2021-07-13 ENCOUNTER — Other Ambulatory Visit: Payer: Self-pay

## 2021-07-13 ENCOUNTER — Encounter (HOSPITAL_BASED_OUTPATIENT_CLINIC_OR_DEPARTMENT_OTHER): Payer: Self-pay | Admitting: Dermatology

## 2021-07-13 ENCOUNTER — Ambulatory Visit: Payer: Medicare Other | Attending: Physician Assistant | Admitting: Dermatology

## 2021-07-13 DIAGNOSIS — M170 Bilateral primary osteoarthritis of knee: Secondary | ICD-10-CM | POA: Diagnosis not present

## 2021-07-13 DIAGNOSIS — L820 Inflamed seborrheic keratosis: Secondary | ICD-10-CM | POA: Insufficient documentation

## 2021-07-13 DIAGNOSIS — L84 Corns and callosities: Secondary | ICD-10-CM | POA: Insufficient documentation

## 2021-07-13 DIAGNOSIS — Z85820 Personal history of malignant melanoma of skin: Secondary | ICD-10-CM | POA: Diagnosis present

## 2021-07-13 DIAGNOSIS — L570 Actinic keratosis: Secondary | ICD-10-CM | POA: Diagnosis present

## 2021-07-13 DIAGNOSIS — Z1283 Encounter for screening for malignant neoplasm of skin: Secondary | ICD-10-CM | POA: Diagnosis present

## 2021-07-13 DIAGNOSIS — D225 Melanocytic nevi of trunk: Secondary | ICD-10-CM | POA: Insufficient documentation

## 2021-07-13 NOTE — Progress Notes (Signed)
Medically appropriate hx:  Pt is 80yo M w/ hx of melanoma and SCC who presents today for f/u and TBSE, last seen in dermatology clinic 6 months ago by Dr. Corey Harold.      Pt reports a personal past dermatologic history MV:HQIONGEX on back, treated at [facility], s/p WLE. No SLN was done. 3 biopsies done on back totally and unclear which one was melanoma  - R and L dorsal hand : SCC, 4-5 years ago ([facility])    Concerned about irritated skin lesion on L clavicle area. Seems to get rubbed by clothing.    Concerned about toe pain on L middle toe. Tender while walking. Seems to rub on shoes. He has not tried anything for his symptoms.    No recent fevers, chills, night sweats, weight loss, nausea, vomiting, diarrhea, headaches, cough, shortness of breath, arthralgias, myalgias    SH: Moved from [facility], to be closer to family. Laurence Aly, is a Curator.Recently purchased a house in [place], recent trip to [place] this summer    Medically appropriate exam:  A total body skin examination was performed including the scalp, face, head, neck, trunk, extremities, mucous membranes, and groin. Genital exam was deferred today. Notable findings include:  AOX3, NAD, pleasant  -lowdensity of brown well-demarcated round 2-69mm macules and thin papules with uniform symmetric pigment on trunk and extremities. None with gross clinical abnormalities or concerning dermatoscopic features.  - L neckclear, no evident scaly papule (was watch site)  -well-healed scars x3on R upper back, R mid back, L mid back,no nodularity/ induration, pigment changes  -well-healed scar onR and L dorsal hand, no nodularity/ induration, piiment changes  - thin waxy tan papules scattered on trunk c/w SK  -No cervical, supraclavicular, axillary LAD  - L 3rd plantar distal toe with hyperkeratotic papule, mild thrombosed vessel after being pared down  - L supraclavicular chest with stuck on brown papule, consistent with  inflamed seborrheic keratosis  -R ear helix with gritty pink papule, consistent with actinic keratosis, to be treated with cryotherapy    Assessment/Plan/Data Review:    80year old gentleman with:    Hx of Skin cancer  - NER at dorsal hands and back sites x 3  - Discussed sun protective measures with patient including regular use of sunscreen with SPF 30 or greater and use of sun protective clothing  - Reviewed ABCDEs of melanoma  - No lesions grossly concerning for MM or NMSC on exam today  - Pt to RTC6 months sooner PRNyear for any concerning lesions     Inflamed SK  -L supraclavicular chest, treated today with cryotherapy    Actinic Keratosis  -R ear helix, treated today with cryotherapy    CRYOTHERAPY  Verbal informed consent was obtained after discussing alternative treatments and risks, including pain, blister formation, necrosis, infection, hypo/hyperpigmentation, recurrence, and scarring.  Cryotherapy 10-15 times 2  was applied to 1 AK, 1 inflamed SK lesions located on the R ear helix (AK) and L supraclavicular chest (SK).  The patient tolerated the procedure well without complications. Post-care instructions were verbally provided to the patient.      L 3rd toe Corn  -Pared down today in clinic  -Can use over the counter salicylic acid corn patches as needed    Melanoma quality documentation:  Melanoma staging:unknown, records to be obtained  Next screening skin examination scheduled:YES5/17/2023  EPIC reminder message sent:YES  Pt added to dermatology melanoma list:YES    RTC 6 months sooner  PRN  Mellody Drown PA-C, PA-C  07/13/21

## 2021-07-20 DIAGNOSIS — M170 Bilateral primary osteoarthritis of knee: Secondary | ICD-10-CM | POA: Diagnosis not present

## 2021-07-27 DIAGNOSIS — M1712 Unilateral primary osteoarthritis, left knee: Secondary | ICD-10-CM | POA: Diagnosis not present

## 2021-07-27 DIAGNOSIS — M1711 Unilateral primary osteoarthritis, right knee: Secondary | ICD-10-CM | POA: Diagnosis not present

## 2021-07-27 DIAGNOSIS — M170 Bilateral primary osteoarthritis of knee: Secondary | ICD-10-CM | POA: Diagnosis not present

## 2021-08-27 ENCOUNTER — Ambulatory Visit (HOSPITAL_COMMUNITY)
Admission: EM | Admit: 2021-08-27 | Discharge: 2021-08-27 | Disposition: A | Discharge status: Home / Self Care | Payer: PPO | Attending: Urgent Care | Admitting: Urgent Care

## 2021-08-27 ENCOUNTER — Other Ambulatory Visit: Payer: Self-pay

## 2021-08-27 ENCOUNTER — Encounter (HOSPITAL_COMMUNITY): Payer: Self-pay | Admitting: Urgent Care

## 2021-08-27 DIAGNOSIS — E785 Hyperlipidemia, unspecified: Secondary | ICD-10-CM | POA: Diagnosis not present

## 2021-08-27 DIAGNOSIS — I480 Paroxysmal atrial fibrillation: Secondary | ICD-10-CM

## 2021-08-27 DIAGNOSIS — U071 COVID-19: Secondary | ICD-10-CM | POA: Diagnosis not present

## 2021-08-27 DIAGNOSIS — I10 Essential (primary) hypertension: Secondary | ICD-10-CM | POA: Insufficient documentation

## 2021-08-27 MED ORDER — NIRMATRELVIR/RITONAVIR (PAXLOVID) TABLET (RENAL DOSING): 2.0000 | ORAL_TABLET | Freq: Two times a day (BID) | ORAL | 0 refills | Status: AC

## 2021-08-27 NOTE — Discharge Instructions (Signed)
For Eliquis (apixaban) take only 1/2 dose that you normally take. For rosuvastatin (Crestor) stop taking this while on Paxlovid.

## 2021-08-27 NOTE — ED Provider Notes (Signed)
[place]   MRN: [id number] DOB: December 15, 1940  Subjective:   Kevin Day is a 80 y.o. male with PMH of paroxysmal atrial fibrillation, hypertension, hyperlipidemia presenting for 3-day history of acute onset runny and stuffy nose, coughing, throat pain.  He did an at-home COVID test and was positive.  No chest pain, shortness of breath, wheezing, abdominal pain, body aches, fevers.  Patient does have his COVID vaccinations and boosters as well.  No history of CKD.  Last creatinine level was 1.38 from 06/03/2021, GFR was 52.  Prior to that his creatinine and September was 1.2 to and 1.21 in August.  He is taking apixaban, rosuvastatin in addition to his blood pressure medications.  No current facility-administered medications for this encounter.  Current Outpatient Medications:    apixaban (ELIQUIS) 5 MG TABS tablet, Take 1 tablet (5 mg total) by mouth 2 (two) times daily., Disp: 60 tablet, Rfl: 6   Cholecalciferol (VITAMIN D3) 50 MCG (2000 UT) TABS, Take 2,000 Units by mouth at bedtime., Disp: , Rfl:    Fish Oil-Cholecalciferol (OMEGA-3 FISH OIL/VITAMIN D3 PO), Take by mouth., Disp: , Rfl:    metoprolol tartrate (LOPRESSOR) 25 MG tablet, Take 0.5 tablets (12.5 mg total) by mouth 2 (two) times daily., Disp: 60 tablet, Rfl: 6   Multiple Vitamins-Minerals (OCUVITE PO), Take 1 tablet by mouth at bedtime., Disp: , Rfl:    pantoprazole (PROTONIX) 20 MG tablet, TAKE 1 TABLET(20 MG) BY MOUTH DAILY, Disp: 90 tablet, Rfl: 2   potassium chloride (KLOR-CON) 10 MEQ tablet, Take 2 tablets (20 mEq total) by mouth daily., Disp: 60 tablet, Rfl: 6   rosuvastatin (CRESTOR) 20 MG tablet, Take 1 tablet (20 mg total) by mouth daily., Disp: 90 tablet, Rfl: 3   sacubitril-valsartan (ENTRESTO) 24-26 MG, Take 1 tablet by mouth 2 (two) times daily., Disp: 60 tablet, Rfl: 3   triamterene-hydrochlorothiazide (MAXZIDE-25) 37.5-25 MG tablet, Take 1 tablet by mouth daily., Disp: , Rfl:    Zinc 50 MG TABS,  , Disp: , Rfl:    No Known Allergies  Past Medical History:  Diagnosis Date   Arthritis    Cataracts, bilateral    Hyperlipidemia    Hypertension    Prostate cancer ([place])    Prostate cancer ([place])    Prostate cancer ([place])    Ruptured lumbar disc    Skin cancer      Past Surgical History:  Procedure Laterality Date   BACK SURGERY     cataract Bilateral    COLONOSCOPY  10/09/2016   POLYPECTOMY     radioactive      radioactive seed implants for prostate CA.     Family History  Problem Relation Age of Onset   Breast cancer Sister    Colon cancer Neg Hx    Esophageal cancer Neg Hx    Rectal cancer Neg Hx    Stomach cancer Neg Hx    Colon polyps Neg Hx     Social History   Tobacco Use   Smoking status: Former   Smokeless tobacco: Never   Tobacco comments:    Former smoker 05/10/2021  Vaping Use   Vaping Use: Never used  Substance Use Topics   Alcohol use: No   Drug use: No    ROS   Objective:   Vitals: BP (!) 132/57 (BP Location: Right Arm)    Pulse 64    Temp 97.9 F (36.6 C) (Oral)    Resp 16  SpO2 95%   Physical Exam Constitutional:      General: He is not in acute distress.    Appearance: Normal appearance. He is well-developed. He is not ill-appearing, toxic-appearing or diaphoretic.  HENT:     Head: Normocephalic and atraumatic.     Right Ear: External ear normal.     Left Ear: External ear normal.     Nose: Nose normal.     Mouth/Throat:     Mouth: Mucous membranes are moist.     Pharynx: Oropharynx is clear.  Eyes:     General: No scleral icterus.    Extraocular Movements: Extraocular movements intact.     Pupils: Pupils are equal, round, and reactive to light.  Cardiovascular:     Rate and Rhythm: Normal rate and regular rhythm.     Heart sounds: Normal heart sounds. No murmur heard.   No friction rub. No gallop.  Pulmonary:     Effort: Pulmonary effort is normal. No respiratory distress.     Breath sounds: Normal breath sounds. No  stridor. No wheezing, rhonchi or rales.  Neurological:     Mental Status: He is alert and oriented to person, place, and time.  Psychiatric:        Mood and Affect: Mood normal.        Behavior: Behavior normal.        Thought Content: Thought content normal.    Recent Results (from the past 2160 hour(s))  Basic metabolic panel     Status: Abnormal   Collection Time: 06/03/21 10:54 AM  Result Value Ref Range   Glucose 91 70 - 99 mg/dL    Comment:               **Please note reference interval change**   BUN 19 8 - 27 mg/dL   Creatinine, Ser 1.38 (H) 0.76 - 1.27 mg/dL   eGFR 52 (L) >59 mL/min/1.73   BUN/Creatinine Ratio 14 10 - 24   Sodium 140 134 - 144 mmol/L   Potassium 3.7 3.5 - 5.2 mmol/L   Chloride 102 96 - 106 mmol/L   CO2 22 20 - 29 mmol/L   Calcium 8.6 8.6 - 10.2 mg/dL     Assessment and Plan :   PDMP not reviewed this encounter.  1. Clinical diagnosis of COVID-19   2. Paroxysmal atrial fibrillation (HCC)   3. Dyslipidemia   4. Essential hypertension     Confirmation test pending.  Will manage for COVID-19 with Paxlovid using the renal dosing.  I advised that he discontinue rosuvastatin while on the COVID antiviral, use half the dose of apixaban. Deferred imaging given clear cardiopulmonary exam, hemodynamically stable vital signs. Counseled patient on potential for adverse effects with medications prescribed/recommended today, ER and return-to-clinic precautions discussed, patient verbalized understanding.    Jaynee Eagles, PA-C 08/27/21 1422

## 2021-08-27 NOTE — ED Triage Notes (Signed)
Pt presents with sore throat, runny nose and cough x 3 days.   States he took an at home test that was positive,

## 2021-08-28 LAB — SARS CORONAVIRUS 2 (TAT 6-24 HRS): SARS Coronavirus 2: NEGATIVE

## 2021-09-19 ENCOUNTER — Other Ambulatory Visit (HOSPITAL_BASED_OUTPATIENT_CLINIC_OR_DEPARTMENT_OTHER): Payer: Self-pay | Admitting: Family Medicine

## 2021-09-19 NOTE — Telephone Encounter (Signed)
[facility], Eric Kemp is a 81 year old male has requested a refill of  - amlodipine-atorvastatin    Last Office Visit: 09/25/19 with PCP    Last Physical Exam: n/a  There are no preventive care reminders to display for this patient.  HTN Med:    Most Recent BP Reading(s)  No data found for BP    Statin Med:  Lipids No results found for: CHOLESTEROL  No results found for: LDL  No results found for: HDL  No results found for: TG  LFTs   No results found for: ALT    No results found for: AST    No results found for: ALBUMIN    No results found for: TP    No results found for: DBILI    No results found for: TBILI    No results found for: [employer]    Other Med Adult:  Most Recent BP Reading(s)  No data found for BP        No results found for: CHOLESTEROL  No results found for: LDL  No results found for: HDL  No results found for: TG      No results found for: TSHSC      No results found for: TSH    No results found for: HGBA1C    No results found for: POCA1C      No results found for: INR    No results found for: NA    No results found for: K        No results found for: CREAT  Documented patient preferred pharmacies:    [place], [place] - [address]  Phone: [phone] Fax: [phone]

## 2021-09-23 DIAGNOSIS — H353131 Nonexudative age-related macular degeneration, bilateral, early dry stage: Secondary | ICD-10-CM | POA: Diagnosis not present

## 2021-09-23 DIAGNOSIS — H26492 Other secondary cataract, left eye: Secondary | ICD-10-CM | POA: Diagnosis not present

## 2021-09-23 DIAGNOSIS — Z961 Presence of intraocular lens: Secondary | ICD-10-CM | POA: Diagnosis not present

## 2021-09-23 DIAGNOSIS — H18413 Arcus senilis, bilateral: Secondary | ICD-10-CM | POA: Diagnosis not present

## 2021-09-23 DIAGNOSIS — H26493 Other secondary cataract, bilateral: Secondary | ICD-10-CM | POA: Diagnosis not present

## 2021-10-02 ENCOUNTER — Other Ambulatory Visit: Payer: Self-pay | Admitting: Cardiovascular Disease

## 2021-10-02 DIAGNOSIS — I480 Paroxysmal atrial fibrillation: Secondary | ICD-10-CM

## 2021-10-02 DIAGNOSIS — I428 Other cardiomyopathies: Secondary | ICD-10-CM

## 2021-10-03 MED ORDER — ENTRESTO 24-26 MG PO TABS: 1.0000 | ORAL_TABLET | Freq: Two times a day (BID) | ORAL | 9 refills | Status: DC

## 2021-10-07 DIAGNOSIS — H26491 Other secondary cataract, right eye: Secondary | ICD-10-CM | POA: Diagnosis not present

## 2021-10-10 ENCOUNTER — Ambulatory Visit: Payer: Medicare Other | Attending: Family Medicine | Admitting: Family Medicine

## 2021-10-10 ENCOUNTER — Encounter (HOSPITAL_BASED_OUTPATIENT_CLINIC_OR_DEPARTMENT_OTHER): Payer: Self-pay | Admitting: Family Medicine

## 2021-10-10 ENCOUNTER — Other Ambulatory Visit: Payer: Self-pay

## 2021-10-10 VITALS — BP 141/72 | HR 47 | Temp 98.0°F | Ht 73.0 in | Wt 192.0 lb

## 2021-10-10 DIAGNOSIS — I10 Essential (primary) hypertension: Secondary | ICD-10-CM | POA: Insufficient documentation

## 2021-10-10 DIAGNOSIS — I4892 Unspecified atrial flutter: Secondary | ICD-10-CM | POA: Diagnosis present

## 2021-10-10 DIAGNOSIS — E785 Hyperlipidemia, unspecified: Secondary | ICD-10-CM | POA: Insufficient documentation

## 2021-10-10 DIAGNOSIS — L219 Seborrheic dermatitis, unspecified: Secondary | ICD-10-CM | POA: Diagnosis present

## 2021-10-10 DIAGNOSIS — M722 Plantar fascial fibromatosis: Secondary | ICD-10-CM | POA: Insufficient documentation

## 2021-10-10 DIAGNOSIS — L84 Corns and callosities: Secondary | ICD-10-CM | POA: Diagnosis present

## 2021-10-10 DIAGNOSIS — Z789 Other specified health status: Secondary | ICD-10-CM | POA: Diagnosis present

## 2021-10-10 DIAGNOSIS — C439 Malignant melanoma of skin, unspecified: Secondary | ICD-10-CM | POA: Insufficient documentation

## 2021-10-10 DIAGNOSIS — Z0000 Encounter for general adult medical examination without abnormal findings: Secondary | ICD-10-CM | POA: Insufficient documentation

## 2021-10-10 MED ORDER — AMLODIPINE BESYLATE 5 MG PO TABS
5.0000 mg | ORAL_TABLET | Freq: Every day | ORAL | 3 refills | Status: DC
Start: 2021-10-10 — End: 2021-11-28

## 2021-10-10 MED ORDER — KETOCONAZOLE 2 % EX CREA
TOPICAL_CREAM | Freq: Two times a day (BID) | CUTANEOUS | 2 refills | Status: DC
Start: 2021-10-10 — End: 2022-09-11

## 2021-10-10 NOTE — Progress Notes (Signed)
YEARLY PHYSICAL EXAM NOTE:     Eric Kemp is a 81 year old male who presents for their yearly physical exam with the following additional concerns:     1. A flutter fu     2. Corn still painful and can't run   Also PF acting up     3. Numbness in L leg   [employer]   Numb right in knee area and bottom left foot   After starting running        REVIEW OF SYSTEMS:  GEN: No fatigue, fevers, chills  HEENT: No headache, visual changes.  NECK: No neck pain.  CHEST: No chest pain.  LUNGS: No SOB.  GI: No abdominal pain, nausea, vomiting, diarrhea, constipation.  GU: No dysuria, urgency or discharge.  MUSC: No muscle or joint pain.  DERM: No rash  NEURO: No numbness, tingling, weakness or falls.  PSYCH: No depression or anxiety. Mood stable.    Medications, allergies and PMH reviewed and updated in Epic.    SH:    reports that he has never smoked. He has never used smokeless tobacco.   reports current alcohol use.    FH:  Review of patient's family history indicates:  Problem: Heart      Relation: Mother          Age of Onset: 63          Comment: had bypass, dementia  Problem: Mental/Emotional Disorders      Relation: Mother          Age of Onset: (Not Specified)          Comment: dementia  Problem: Cancer - Ovarian      Relation: Mother          Age of Onset: 36          Comment: early diagnosis and removal  Problem: Heart      Relation: Father          Age of Onset: (Not Specified)  Problem: Stroke      Relation: Father          Age of Onset: (Not Specified)  Problem: Cancer - Colon      Relation: Father          Age of Onset: (Not Specified)          Comment: died from a blockage - 65s      O:  PHYSICAL EXAM:  BP (!) 141/72    Pulse (!) 47    Temp 98 F (36.7 C) (Temporal)    Ht 6\' 1"  (1.854 m)    Wt 87.1 kg (192 lb)    SpO2 96%    BMI 25.33 kg/m   GEN: NAD  DERM: WWP, no rashes  Head: NCAT  Eyes: EOMI  ENT: MMM  No nasal or ear discharge.  NECK: full range of motion  CHEST: speaking in full sentances  HEART: RRR, no  murmurs  ABDO: ND  NEURO: Grossly intact  EXT: no edema     A/P:  Eric Kemp is a 81 year old male presents for yearly physical exam with the following additional diagnoses:     Problem List Items Addressed This Visit        Risk Adjustment Coding    Melanoma of skin ([place])       Other    Plantar fasciitis    Essential hypertension    Relevant Medications    amLODIPine (NORVASC) 5 MG tablet  Dyslipidemia   Other Visit Diagnoses     Routine adult health maintenance    -  Primary    Atrial flutter, unspecified type ([place])        Corns and callosities        Relevant Orders    REFERRAL TO PODIATRY (INT) (Completed)    Seborrheic dermatitis        Relevant Orders    LIPID PANEL        Problem List        Risk Adjustment Coding    Melanoma of skin ([place])       Other    Plantar fasciitis    Essential hypertension    Relevant Medications    amLODIPine (NORVASC) 5 MG tablet    Dyslipidemia     Reviewed lipids were excellent   Probably can trial off statin and recheck 8 wks  Reviewed BP right on cusp and has been less active  Will recommend frequent checks and resuming running  Reviewed corn and recommend podiatry       Health Maintenance:  Reviewed HM    I have reviewed the past medical, surgical, social and family history and updated these sections of EpicCare as relevant. Medications were reconciled during this visit and a current medication list was given to the patient at the end of the visit.    More than 20 mins of time was spent in counseling.     Lemar Livings, MD

## 2021-10-10 NOTE — Patient Instructions (Signed)
Audiology [phone]  Physiatry [phone]   [facility] [phone]  Plastic Surgery [phone]   Cardiology [phone]  Podiatry [phone]   Dermatology [phone] option 3  Pulmonology [phone]   Endocrinology [phone]  Rheumatology [phone]   ENT/Allergy [phone]  Sports Medicine [phone]   Gastroenterology [phone]  TB Clinic [phone]   Infectious Diseases [phone]  Thoracic Surgery [phone]   Nephrology [phone]  Travel Clinic (18+) [phone]   Neurology/EMG [phone]  Urology [phone]   Occupational Health [phone]  Vascular Surgery [phone]   Orthopedics [phone]  [place] (HIV) [phone]          [facility] (Ophthalmology/Optometry)  Women's Health (Gyn/OB/Midwives)   [place] [phone]  [place] [phone]   [place] [phone]  [place] [phone]      [place] [phone]          Radiology [phone]  Cards/Pulm Testing [phone]           PT/OT/Speech     Assembly  [phone]     [place]       [place]

## 2021-10-10 NOTE — Addendum Note (Signed)
Addended by: Beverly Milch T on: 10/10/2021 09:49 AM     Modules accepted: Orders, SmartSet

## 2021-10-13 DIAGNOSIS — H26491 Other secondary cataract, right eye: Secondary | ICD-10-CM | POA: Diagnosis not present

## 2021-10-17 ENCOUNTER — Encounter (HOSPITAL_BASED_OUTPATIENT_CLINIC_OR_DEPARTMENT_OTHER): Payer: Self-pay | Admitting: Family Medicine

## 2021-10-24 ENCOUNTER — Ambulatory Visit: Payer: Medicare Other | Attending: Podiatrist | Admitting: Podiatrist

## 2021-10-24 ENCOUNTER — Other Ambulatory Visit: Payer: Self-pay

## 2021-10-24 ENCOUNTER — Encounter (HOSPITAL_BASED_OUTPATIENT_CLINIC_OR_DEPARTMENT_OTHER): Payer: Self-pay | Admitting: Podiatrist

## 2021-10-24 ENCOUNTER — Ambulatory Visit: Payer: PPO | Admitting: Cardiovascular Disease

## 2021-10-24 DIAGNOSIS — M542 Cervicalgia: Secondary | ICD-10-CM | POA: Diagnosis not present

## 2021-10-24 DIAGNOSIS — M2042 Other hammer toe(s) (acquired), left foot: Secondary | ICD-10-CM | POA: Insufficient documentation

## 2021-10-24 DIAGNOSIS — L859 Epidermal thickening, unspecified: Secondary | ICD-10-CM | POA: Insufficient documentation

## 2021-10-24 DIAGNOSIS — M79675 Pain in left toe(s): Secondary | ICD-10-CM | POA: Diagnosis present

## 2021-10-25 NOTE — Progress Notes (Signed)
HPI: Eric Kemp is a 81 year old who presents for evaluation of a painful callus on the bottom of his left third toe.  This seems to continue to occur despite alterations he has made in his shoes.  He also has had some pain on the outside of his foot which comes and goes as well.  He is an avid runner.  Ran a marathon last year.  Currently continues to run.  Replaces his shoes regularly.  He does occasionally get some cramping in the toes on the left foot as well.  No other pedal concerns.    Patient Active Problem List:     Melanoma of skin ([place])     Dyslipidemia     Calculus of kidney     Essential hypertension     Plantar fasciitis     History reviewed. No pertinent surgical history.     ketoconazole (NIZORAL) 2 % cream, Apply topically 2 (two) times daily For sides of nose and brows, as needed., Disp: 60 g, Rfl: 2  amLODIPine (NORVASC) 5 MG tablet, Take 1 tablet by mouth in the morning., Disp: 90 tablet, Rfl: 3    No current facility-administered medications on file prior to visit.     Review of Patient's Allergies indicates:  No Known Allergies     Social history: Non-smoker.  He is an avid runner.    family history includes Cancer - Colon in his father; Cancer - Ovarian (age of onset: 53) in his mother; Heart in his father; Heart (age of onset: 52) in his mother; Mental/Emotional Disorders in his mother; Stroke in his father.     Physical exam:  General: Pleasant, no apparent distress  Lower extremities: Dorsalis pedis and posttibial pulses are 2/4 bilateral capillary fill time is within normal limits.  There is no lower extremity edema.  Light touch sensation is intact to level of the digits bilateral.  Skin envelope is intact.  There is hyperkeratosis noted at the distal aspect of the left third digit as well as plantar fifth metatarsal head.  No other significant hyperkeratotic lesions are present.  Webspaces are clean dry and intact.  Nails are within normal limits.  On the left foot, he does  have some mild contractures of the lesser digits which are semireducible.  Evidenced by gripping of the ground in stance and gait.  No other motor weaknesses are noted.  No other deformities.  Muscle mass muscle strength are within normal limits to the distal lower extremity muscle groups.    Assessment: Hammertoe deformities and painful hyperkeratotic lesions    Plan: Performed lower extremity evaluation medical review.  I pared the hyperkeratotic lesion with a #15 blade without incident.  I provided him with a crest pad and advised him to use this as needed.  Explained that these likely will recur.  Potential treatments could include flexor tenotomies however given that he is still actively running long distances, I would advise against this as he is likely using the grip for pushoff strength.  I educated him on the importance of stretching as well as posterior column strengthening exercises to help reduce excessive strain of the flexors.  He will follow-up as needed.    A majority of this note has been dictated with a voice recognition system. Occasional wrong-word or "sound-A-like" substitutions may have occurred due to the inherent limitations of voice recognition software. Read the chart carefully and recognize, using context, where substitutions have occurred. Please excuse any identified errors in spelling  or syntax. Every effort has been made to appropriately edit and correct upon completion.

## 2021-11-03 DIAGNOSIS — M542 Cervicalgia: Secondary | ICD-10-CM | POA: Diagnosis not present

## 2021-11-14 ENCOUNTER — Ambulatory Visit (HOSPITAL_BASED_OUTPATIENT_CLINIC_OR_DEPARTMENT_OTHER): Payer: Medicare Other | Admitting: Otolaryngology

## 2021-11-14 ENCOUNTER — Telehealth: Payer: Self-pay | Admitting: Cardiovascular Disease

## 2021-11-14 NOTE — Telephone Encounter (Signed)
Spoke with pt who reports he is currently in Afib and has been for about 30 minutes.  He is symptomatic with chest discomfort (burning).  Pt states he had first episode of Afib last year with the same symptom.  HR is 82 and BP 120/66.  He is taking Metoprolol Tartrate 12.'5mg'$  bid and is anticoagulated with Eliquis '5mg'$  bid and has not missed any doses.  He is scheduled to see Dr Burt Knack 11/22/2021.   ?Pt advised will forward to Dr Burt Knack for further advisement.  Reviewed ED precautions.  Pt verbalizes understanding and is agreeable with current plan.  ?

## 2021-11-14 NOTE — Telephone Encounter (Signed)
Patient c/o Palpitations:  High priority if patient c/o lightheadedness, shortness of breath, or chest pain ? ?How long have you had palpitations/irregular HR/ Afib? Are you having the symptoms now? Yes ; 30-40 min ago ? ?Are you currently experiencing lightheadedness, SOB or CP? CP like a heartburn ? ?Do you have a history of afib (atrial fibrillation) or irregular heart rhythm? Yes  ? ?Have you checked your BP or HR? (document readings if available): 120/66; 82 ? ?Are you experiencing any other symptoms? No   ?

## 2021-11-14 NOTE — Telephone Encounter (Signed)
Reviewed chart.  Patient with similar symptoms in the past when he had atrial fibrillation.  Advised that he take an additional 12.5 mg of metoprolol now.  That we will give him 3 total doses today.  Tomorrow, I would like him to increase his dose to 25 mg twice daily.  If he is experiencing significant chest pain or pressure, I am recommending that he go to the emergency department for evaluation.  Otherwise he has scheduled follow-up with me in 1 week and I will assess him at that time.  The patient is being contacted with these recommendations by our nursing staff at this time. ? ?Sherren Mocha ?11/14/2021 ?5:46 PM ? ?

## 2021-11-14 NOTE — Telephone Encounter (Signed)
Called and relayed recommendation to patient who read-back instructions on Metoprolol. Pt will go to ED if pain is significant or worsening,otherwise will see [place] here on 3/28. ?

## 2021-11-14 NOTE — Telephone Encounter (Signed)
Patient called back to follow up on call from earlier today. He has not heard anything from our office yet. ?

## 2021-11-16 ENCOUNTER — Other Ambulatory Visit: Payer: Self-pay

## 2021-11-16 ENCOUNTER — Ambulatory Visit (HOSPITAL_COMMUNITY)
Admission: RE | Admit: 2021-11-16 | Discharge: 2021-11-16 | Disposition: A | Discharge status: Home / Self Care | Payer: PPO | Source: Ambulatory Visit | Attending: Nurse Practitioner | Admitting: Nurse Practitioner

## 2021-11-16 ENCOUNTER — Encounter (HOSPITAL_COMMUNITY): Payer: Self-pay | Admitting: Nurse Practitioner

## 2021-11-16 VITALS — BP 154/76 | HR 70 | Ht 72.0 in | Wt 208.6 lb

## 2021-11-16 DIAGNOSIS — I10 Essential (primary) hypertension: Secondary | ICD-10-CM | POA: Diagnosis not present

## 2021-11-16 DIAGNOSIS — Z79899 Other long term (current) drug therapy: Secondary | ICD-10-CM | POA: Diagnosis not present

## 2021-11-16 DIAGNOSIS — Z7901 Long term (current) use of anticoagulants: Secondary | ICD-10-CM | POA: Insufficient documentation

## 2021-11-16 DIAGNOSIS — D6869 Other thrombophilia: Secondary | ICD-10-CM | POA: Diagnosis not present

## 2021-11-16 DIAGNOSIS — R0789 Other chest pain: Secondary | ICD-10-CM | POA: Diagnosis not present

## 2021-11-16 DIAGNOSIS — I480 Paroxysmal atrial fibrillation: Secondary | ICD-10-CM

## 2021-11-16 DIAGNOSIS — E785 Hyperlipidemia, unspecified: Secondary | ICD-10-CM | POA: Diagnosis not present

## 2021-11-16 DIAGNOSIS — I700 Atherosclerosis of aorta: Secondary | ICD-10-CM | POA: Insufficient documentation

## 2021-11-16 LAB — BASIC METABOLIC PANEL
Anion gap: 11 (ref 5–15)
BUN: 28 mg/dL — ABNORMAL HIGH (ref 8–23)
CO2: 23 mmol/L (ref 22–32)
Calcium: 9 mg/dL (ref 8.9–10.3)
Chloride: 103 mmol/L (ref 98–111)
Creatinine, Ser: 1.48 mg/dL — ABNORMAL HIGH (ref 0.61–1.24)
GFR, Estimated: 48 mL/min — ABNORMAL LOW (ref >60)
Glucose, Bld: 115 mg/dL — ABNORMAL HIGH (ref 70–99)
Potassium: 3.7 mmol/L (ref 3.5–5.1)
Sodium: 137 mmol/L (ref 135–145)

## 2021-11-16 LAB — CBC
HCT: 48.9 % (ref 39.0–52.0)
Hemoglobin: 16.8 g/dL (ref 13.0–17.0)
MCH: 32.5 pg (ref 26.0–34.0)
MCHC: 34.4 g/dL (ref 30.0–36.0)
MCV: 94.6 fL (ref 80.0–100.0)
Platelets: 148 10*3/uL — ABNORMAL LOW (ref 150–400)
RBC: 5.17 MIL/uL (ref 4.22–5.81)
RDW: 14.6 % (ref 11.5–15.5)
WBC: 10.4 10*3/uL (ref 4.0–10.5)
nRBC: 0 % (ref 0.0–0.2)

## 2021-11-16 MED ORDER — METOPROLOL TARTRATE 25 MG PO TABS: 25.0000 mg | ORAL_TABLET | Freq: Two times a day (BID) | ORAL | Status: DC

## 2021-11-16 NOTE — Progress Notes (Signed)
? ? ?Primary Care Physician: Sueanne Margarita, DO ?Primary Cardiologist: Dr Burt Knack ?Primary Electrophysiologist: none ?Referring Physician: [facility] ? ? ?Kevin Day is a 81 y.o. male with a history of HLD, aortic atherosclerosis, dilated aortic root, HTN, atrial fibrillation who presents for follow up in the [facility].  The patient was initially diagnosed with atrial fibrillation 04/01/21 after presenting to the ED with symptoms of chest discomfort and SOB. ECG showed rate controlled afib. His K+ was 2.9. he was started on Eliquis for a CHADS2VASC score of 4, metoprolol, and KCL.  ? ?On follow up today, patient reports that he has done well since his last visit. His smart watch has shown only SR. He does complain of mild, central chest discomfort which does not radiate. He states it is near constant but deep breathing does help. He states it has been chronic for years. He denies any bleeding issues on anticoagulation.  ? ?F/u in afib clinic 11/16/21 for pt noticing by his apple watch that he went into afib Monday 11/14/21. He contacted Dr. Antionette Char office on that day and metoprolol was increased to 25 mg bid and referred here. He notices mild chest burning in afib but he does not feel bad otherwise. He said if not for his apple watch, he would not have paid much attention to his heart rhythm . He is well rate controlled. Afib  has occurred on the tail end  of 2 steroid tapers for neck pain, which is much better. He finishes the taper tomorrow. No missed anticoagulation for at lease 3 weeks.  ? ?Today, he denies symptoms of palpitations, shortness of breath, orthopnea, PND, lower extremity edema, dizziness, presyncope, syncope, snoring, daytime somnolence, bleeding, or neurologic sequela. The patient is tolerating medications without difficulties and is otherwise without complaint today.  ? ? ?Atrial Fibrillation Risk Factors: ? ?he does not have symptoms or diagnosis of sleep  apnea. ?he does not have a history of rheumatic fever. ?he does not have a history of alcohol use. ? ? ?he has a BMI of Body mass index is 28.29 kg/m?Marland KitchenMarland Kitchen ?Filed Weights  ? 11/16/21 1058  ?Weight: 94.6 kg  ? ? ?Family History  ?Problem Relation Age of Onset  ? Breast cancer Sister   ? Colon cancer Neg Hx   ? Esophageal cancer Neg Hx   ? Rectal cancer Neg Hx   ? Stomach cancer Neg Hx   ? Colon polyps Neg Hx   ? ? ? ?Atrial Fibrillation Management history: ? ?Previous antiarrhythmic drugs: none ?Previous cardioversions: none ?Previous ablations: none ?CHADS2VASC score: 4 ?Anticoagulation history: Eliquis ? ? ?Past Medical History:  ?Diagnosis Date  ? Arthritis   ? Cataracts, bilateral   ? Hyperlipidemia   ? Hypertension   ? Prostate cancer ([place])   ? Prostate cancer ([place])   ? Prostate cancer ([place])   ? Ruptured lumbar disc   ? Skin cancer   ? ?Past Surgical History:  ?Procedure Laterality Date  ? BACK SURGERY    ? cataract Bilateral   ? COLONOSCOPY  10/09/2016  ? POLYPECTOMY    ? radioactive     ? radioactive seed implants for prostate CA.   ? ? ?Current Outpatient Medications  ?Medication Sig Dispense Refill  ? amLODipine (NORVASC) 5 MG tablet Take 5 mg by mouth daily.    ? apixaban (ELIQUIS) 5 MG TABS tablet Take 1 tablet (5 mg total) by mouth 2 (two) times daily. 60 tablet 6  ?  Cholecalciferol (VITAMIN D3) 50 MCG (2000 UT) TABS Take 2,000 Units by mouth at bedtime.    ? Fish Oil-Cholecalciferol (OMEGA-3 FISH OIL/VITAMIN D3 PO) Take 3 tablets by mouth every morning.    ? metoprolol tartrate (LOPRESSOR) 25 MG tablet Take 0.5 tablets (12.5 mg total) by mouth 2 (two) times daily. 60 tablet 6  ? Multiple Vitamins-Minerals (OCUVITE PO) Take 1 tablet by mouth at bedtime.    ? pantoprazole (PROTONIX) 20 MG tablet TAKE 1 TABLET(20 MG) BY MOUTH DAILY 90 tablet 2  ? potassium chloride (KLOR-CON) 10 MEQ tablet Take 2 tablets (20 mEq total) by mouth daily. 60 tablet 6  ? predniSONE (STERAPRED UNI-PAK 21 TAB) 10 MG (21) TBPK tablet  See admin instructions. follow package directions    ? rosuvastatin (CRESTOR) 20 MG tablet Take 1 tablet (20 mg total) by mouth daily. 90 tablet 3  ? sacubitril-valsartan (ENTRESTO) 24-26 MG Take 1 tablet by mouth 2 (two) times daily. 60 tablet 9  ? triamterene-hydrochlorothiazide (MAXZIDE-25) 37.5-25 MG tablet Take 1 tablet by mouth daily.    ? ?No current facility-administered medications for this encounter.  ? ? ?No Known Allergies ? ?Social History  ? ?Socioeconomic History  ? Marital status: Married  ?  Spouse name: Not on file  ? Number of children: Not on file  ? Years of education: Not on file  ? Highest education level: Not on file  ?Occupational History  ? Not on file  ?Tobacco Use  ? Smoking status: Former  ? Smokeless tobacco: Never  ? Tobacco comments:  ?  Former smoker 05/10/2021  ?Vaping Use  ? Vaping Use: Never used  ?Substance and Sexual Activity  ? Alcohol use: No  ? Drug use: No  ? Sexual activity: Not on file  ?Other Topics Concern  ? Not on file  ?Social History Narrative  ? Not on file  ? ?Social Determinants of Health  ? ?Financial Resource Strain: Not on file  ?Food Insecurity: Not on file  ?Transportation Needs: Not on file  ?Physical Activity: Not on file  ?Stress: Not on file  ?Social Connections: Not on file  ?Intimate Partner Violence: Not on file  ? ? ? ?ROS- All systems are reviewed and negative except as per the HPI above. ? ?Physical Exam: ?Vitals:  ? 11/16/21 1058  ?BP: (!) 154/76  ?Pulse: 70  ?Weight: 94.6 kg  ?Height: 6' (1.829 m)  ? ?GEN- The patient is a well appearing elderly male, alert and oriented x 3 today.   ?HEENT-head normocephalic, atraumatic, sclera clear, conjunctiva pink, hearing intact, trachea midline. ?Lungs- Clear to ausculation bilaterally, normal work of breathing ?Heart- irregular rate and rhythm, no murmurs, rubs or gallops  ?GI- soft, NT, ND, + BS ?Extremities- no clubbing, cyanosis, or edema ?MS- no significant deformity or atrophy ?Skin- no rash or  lesion ?Psych- euthymic mood, full affect ?Neuro- strength and sensation are intact ? ? ?Wt Readings from Last 3 Encounters:  ?11/16/21 94.6 kg  ?06/24/21 93.5 kg  ?05/24/21 93.4 kg  ? ? ?EKG today demonstrates Vent. rate 70 BPM ?PR interval * ms ?QRS duration 102 ms ?QT/QTcB 386/416 ms ?P-R-T axes * -23 60 ?Atrial fibrillation ?Abnormal ECG ?When compared with ECG of 10-May-2021 10:00, ?PREVIOUS ECG IS PRESENT ? ? ?Echo 03/20/21 demonstrated  ? 1. The left ventricle has normal systolic function, with an ejection  ?fraction of 55-60%. The cavity size was normal. Left ventricular diastolic Doppler parameters are consistent with impaired relaxation.  ? 2. The  average left ventricular global longitudinal strain is normal at  ?-18.8 %.  ? 3. The right ventricle has normal systolic function. The cavity was  ?normal. There is no increase in right ventricular wall thickness.  ? 4. There is mild mitral annular calcification present.  ? 5. The aortic valve has an indeterminate number of cusps. Moderate  ?sclerosis of the aortic valve. Aortic valve regurgitation is mild by color  ?flow Doppler.  ? 6. The aorta is abnormal in size and structure.  ? 7. There is mild dilatation of the aortic root and of the ascending aorta  ?measuring 41 mm and 19m respectively.  ? ?Epic records are reviewed at length today ? ?CHA2DS2-VASc Score = 4  ?The patient's score is based upon: ?CHF History: 0 ?HTN History: 1 ?Diabetes History: 0 ?Stroke History: 0 ?Vascular Disease History: 1 (aortic atherosclerosis) ?Age Score: 2 ?Gender Score: 0 ?   ? ? ?ASSESSMENT AND PLAN: ?1. Paroxysmal Atrial Fibrillation (ICD10:  I48.0) ?Returned to afib on 11/14/21 likely exacerbated by 2 recent steroid tapers   ?The patient's CHA2DS2-VASc score is 4, indicating a 4.8% annual risk of stroke.   ?Continue Lopressor 25 mg BID ?Continue Eliquis 5 mg BID, states no missed doses for 3 weeks  ?Will schedule for cardioversion 12/01/21 , pt will continue to watch rhythm on  apple watch, there may be a chance he will return to SR when steroids are out  of his system ?Cbc/bmet ? ?2. Secondary Hypercoagulable State (ICD10:  D68.69) ?The patient is at significant risk for stroke/thromboemb

## 2021-11-16 NOTE — Telephone Encounter (Signed)
Spoke with pt who reports he continues to be in Afib despite increase of Metoprolol.  He complains of some chest discomfort (burning) but denies SOB or dizziness.  Appointment scheduled with Afib clinic today at 11am.  Parking code provided.  Pt verbalizes understanding and agrees with current plan. ?

## 2021-11-16 NOTE — Addendum Note (Signed)
Encounter addended by: Enid Derry, CMA on: 11/16/2021 12:32 PM ? Actions taken: Order list changed

## 2021-11-16 NOTE — Patient Instructions (Addendum)
Cardioversion scheduled for Thursday, April 6th ? - Arrive at the [facility] and go to admitting at 830am ? - Do not eat or drink anything after midnight the night prior to your procedure. ? - Take all your morning medication (except diabetic medications) with a sip of water prior to arrival. ? - You will not be able to drive home after your procedure. ? - Do NOT miss any doses of your blood thinner - if you should miss a dose please notify our office immediately. ? - If you feel as if you go back into normal rhythm prior to scheduled cardioversion, please notify our office immediately. If your procedure is canceled in the cardioversion suite you will be charged a cancellation fee. ? ?

## 2021-11-16 NOTE — Telephone Encounter (Signed)
Patient c/o Palpitations:  High priority if patient c/o lightheadedness, shortness of breath, or chest pain ?  ?How long have you had palpitations/irregular HR/ Afib? Are you having the symptoms now? Yes  ?  ?Are you currently experiencing lightheadedness, SOB or CP? CP like a heartburn; a little tightness in chest ?  ?Do you have a history of afib (atrial fibrillation) or irregular heart rhythm? Yes  ?  ?Have you checked your BP or HR? (document readings if available): 138/86; 78 ?  ?Are you experiencing any other symptoms? No  ? ?Patient was told to double up on metoprolol tartrate (LOPRESSOR) 25 MG tablet but patient is still in AFIB ?

## 2021-11-21 NOTE — Progress Notes (Signed)
?Cardiology Office Note:   ? ?Date:  11/22/2021  ? ?ID:  Kevin Day, DOB 12-Aug-1941, MRN [id number] ? ?PCP:  Sueanne Margarita, DO ?  ?[facility] Providers ?Cardiologist:  Sherren Mocha, MD    ? ?Referring MD: Sueanne Margarita, DO  ? ?Chief Complaint  ?Patient presents with  ? Atrial Fibrillation  ? ? ?History of Present Illness:   ? ?Kevin Day is a 81 y.o. male with a hx of persistent atrial fibrillation, presenting for follow-up evaluation.  Comorbid medical problems include mixed hyperlipidemia, aortic atherosclerosis, moderate nonobstructive CAD, dilated aortic root, HFrEF (45-50%), and hypertension. ? ?The patient was recently seen by Roderic Palau in the A-fib clinic after noticing that his smart watch demonstrated recurrent atrial fibrillation beginning 11/14/2021.  He is scheduled for cardioversion in the near future. The patient is here with his wife today. He had a continuous burning in his chest for several weeks but this resolved about 4 days ago and has not recurred. There is no exertional component to this symptom. He denies dyspnea, edema, orthopnea, PND, or palpitations. He feels like he's been in atrial fibrillation for one month. He hasn't missed any Eliquis doses.  ? ?Past Medical History:  ?Diagnosis Date  ? Arthritis   ? Cataracts, bilateral   ? Hyperlipidemia   ? Hypertension   ? Prostate cancer ([place])   ? Prostate cancer ([place])   ? Prostate cancer ([place])   ? Ruptured lumbar disc   ? Skin cancer   ? ? ?Past Surgical History:  ?Procedure Laterality Date  ? BACK SURGERY    ? cataract Bilateral   ? COLONOSCOPY  10/09/2016  ? POLYPECTOMY    ? radioactive     ? radioactive seed implants for prostate CA.   ? ? ?Current Medications: ?Current Meds  ?Medication Sig  ? amLODipine (NORVASC) 5 MG tablet Take 5 mg by mouth daily.  ? apixaban (ELIQUIS) 5 MG TABS tablet Take 1 tablet (5 mg total) by mouth 2 (two) times daily.  ? Cholecalciferol (VITAMIN D3) 50 MCG (2000 UT) TABS Take 2,000 Units by mouth at  bedtime.  ? Fish Oil-Cholecalciferol (OMEGA-3 FISH OIL/VITAMIN D3 PO) Take 3 tablets by mouth every morning.  ? metoprolol tartrate (LOPRESSOR) 25 MG tablet Take 1 tablet (25 mg total) by mouth 2 (two) times daily. (Patient taking differently: Take 25 mg by mouth 2 (two) times daily. Per patient taking 1/2 tablet twice a day)  ? Multiple Vitamins-Minerals (OCUVITE PO) Take 1 tablet by mouth at bedtime.  ? pantoprazole (PROTONIX) 20 MG tablet TAKE 1 TABLET(20 MG) BY MOUTH DAILY  ? potassium chloride (KLOR-CON) 10 MEQ tablet Take 2 tablets (20 mEq total) by mouth daily.  ? predniSONE (STERAPRED UNI-PAK 21 TAB) 10 MG (21) TBPK tablet See admin instructions. follow package directions  ? rosuvastatin (CRESTOR) 20 MG tablet Take 1 tablet (20 mg total) by mouth daily.  ? sacubitril-valsartan (ENTRESTO) 24-26 MG Take 1 tablet by mouth 2 (two) times daily.  ? triamterene-hydrochlorothiazide (MAXZIDE-25) 37.5-25 MG tablet Take 1 tablet by mouth daily.  ?  ? ?Allergies:   Patient has no known allergies.  ? ?Social History  ? ?Socioeconomic History  ? Marital status: Married  ?  Spouse name: Not on file  ? Number of children: Not on file  ? Years of education: Not on file  ? Highest education level: Not on file  ?Occupational History  ? Not on file  ?Tobacco Use  ? Smoking status: Former  ?  Smokeless tobacco: Never  ? Tobacco comments:  ?  Former smoker 05/10/2021  ?Vaping Use  ? Vaping Use: Never used  ?Substance and Sexual Activity  ? Alcohol use: No  ? Drug use: No  ? Sexual activity: Not on file  ?Other Topics Concern  ? Not on file  ?Social History Narrative  ? Not on file  ? ?Social Determinants of Health  ? ?Financial Resource Strain: Not on file  ?Food Insecurity: Not on file  ?Transportation Needs: Not on file  ?Physical Activity: Not on file  ?Stress: Not on file  ?Social Connections: Not on file  ?  ? ?Family History: ?The patient's family history includes Breast cancer in his sister. There is no history of Colon  cancer, Esophageal cancer, Rectal cancer, Stomach cancer, or Colon polyps. ? ?ROS:   ?Please see the history of present illness.    ?All other systems reviewed and are negative. ? ?EKGs/Labs/Other Studies Reviewed:   ? ?The following studies were reviewed today: ?Echo 05/23/2021: ?1. Left ventricular ejection fraction, by estimation, is 45 to 50%. The  ?left ventricle has mildly decreased function. The left ventricle  ?demonstrates global hypokinesis. Left ventricular diastolic parameters are  ?consistent with Grade I diastolic  ?dysfunction (impaired relaxation).  ? 2. Right ventricular systolic function is normal. The right ventricular  ?size is normal.  ? 3. Left atrial size was mildly dilated.  ? 4. The mitral valve is normal in structure. Mild mitral valve  ?regurgitation. No evidence of mitral stenosis.  ? 5. The aortic valve is tricuspid. There is moderate calcification of the  ?aortic valve. There is moderate thickening of the aortic valve. Aortic  ?valve regurgitation is mild to moderate. Moderate aortic valve stenosis.  ? 6. Aortic dilatation noted. There is mild dilatation of the ascending  ?aorta, measuring 41 mm.  ? 7. The inferior vena cava is normal in size with greater than 50%  ?respiratory variability, suggesting right atrial pressure of 3 mmHg.  ? ?Comparison(s): Prior images reviewed side by side. The left ventricular  ?function is worsened. EF 55-60%; There is mild dilatation of the aortic  ?root and of the ascending aorta measuring 41 mm and 8m respectively.  ? ?EKG:  EKG is ordered today.  The ekg ordered today demonstrates atrial fibrillation 67 bpm ? ?Recent Labs: ?04/01/2021: ALT 16; B Natriuretic Peptide 132.0 ?11/16/2021: BUN 28; Creatinine, Ser 1.48; Hemoglobin 16.8; Platelets 148; Potassium 3.7; Sodium 137  ?Recent Lipid Panel ?   ?Component Value Date/Time  ? CHOL 142 05/30/2019 0750  ? TRIG 151 (H) 05/30/2019 0750  ? HDL 39 (L) 05/30/2019 0750  ? CHOLHDL 3.6 05/30/2019 0750  ? LB[place] 77 05/30/2019 0750  ? ? ? ?Risk Assessment/Calculations:   ? ?CHA2DS2-VASc Score = 4  ? This indicates a 4.8% annual risk of stroke. ?The patient's score is based upon: ?CHF History: 0 ?HTN History: 1 ?Diabetes History: 0 ?Stroke History: 0 ?Vascular Disease History: 1 (aortic atherosclerosis) ?Age Score: 2 ?Gender Score: 0 ?  ? ? ?    ? ?Physical Exam:   ? ?VS:  BP 116/72   Pulse 73   Ht 6' (1.829 m)   Wt 211 lb (95.7 kg)   SpO2 96%   BMI 28.62 kg/m?    ? ?Wt Readings from Last 3 Encounters:  ?11/22/21 211 lb (95.7 kg)  ?11/16/21 208 lb 9.6 oz (94.6 kg)  ?06/24/21 206 lb 3.2 oz (93.5 kg)  ?  ? ?GEN:  Well nourished,  well developed pleasant elderly man in no acute distress ?HEENT: Normal ?NECK: No JVD; No carotid bruits ?LYMPHATICS: No lymphadenopathy ?CARDIAC: irregularly irregular very quickly the medicine that you get up is very short you you literally wake up after, no murmurs, rubs, gallops ?RESPIRATORY:  Clear to auscultation without rales, wheezing or rhonchi  ?ABDOMEN: Soft, non-tender, non-distended ?MUSCULOSKELETAL:  No edema; No deformity  ?SKIN: Warm and dry ?NEUROLOGIC:  Alert and oriented x 3 ?PSYCHIATRIC:  Normal affect  ? ?ASSESSMENT:   ? ?1. Paroxysmal atrial fibrillation (HCC)   ?2. Secondary hypercoagulable state ([place])   ?3. Coronary artery disease of native artery of native heart with stable angina pectoris ([place])   ?4. HFrEF (heart failure with reduced ejection fraction) ([place])   ?5. Essential hypertension   ? ?PLAN:   ? ?In order of problems listed above: ? ?We discussed the natural history of atrial fibrillation at length today.  I agree that elective cardioversion is appropriate and indicated in this patient with history of LV dysfunction.  He understands that if atrial fibrillation recurs, his options will include antiarrhythmic drug therapy, catheter-based therapies such as A-fib ablation, and a rate control strategy.  We discussed the pros and cons of each treatment option and he  understands that these decisions can be made in the future if and when atrial fibrillation recurs.  He will continue on oral anticoagulation with apixaban. ?Anticoagulated with apixaban.  Reports no missed doses.  W

## 2021-11-21 NOTE — H&P (View-Only) (Signed)
?Cardiology Office Note:   ? ?Date:  11/22/2021  ? ?ID:  Kevin Day, DOB [birth year], MRN [id number] ? ?PCP:  Kevin Margarita, DO ?  ?[facility] Providers ?Cardiologist:  Kevin Mocha, MD    ? ?Referring MD: Kevin Margarita, DO  ? ?Chief Complaint  ?Patient presents with  ? Atrial Fibrillation  ? ? ?History of Present Illness:   ? ?Kevin Day is a 81 y.o. male with a hx of persistent atrial fibrillation, presenting for follow-up evaluation.  Comorbid medical problems include mixed hyperlipidemia, aortic atherosclerosis, moderate nonobstructive CAD, dilated aortic root, HFrEF (45-50%), and hypertension. ? ?Kevin patient was recently seen by Kevin Day in Kevin A-fib clinic after noticing that his smart watch demonstrated recurrent atrial fibrillation beginning 11/14/2021.  He is scheduled for cardioversion in Kevin near future. Kevin patient is here with his wife today. He had a continuous burning in his chest for several weeks but this resolved about 4 days ago and has not recurred. There is no exertional component to this symptom. He denies dyspnea, edema, orthopnea, PND, or palpitations. He feels like he's been in atrial fibrillation for one month. He hasn't missed any Eliquis doses.  ? ?Past Medical History:  ?Diagnosis Date  ? Arthritis   ? Cataracts, bilateral   ? Hyperlipidemia   ? Hypertension   ? Prostate cancer ([place])   ? Prostate cancer ([place])   ? Prostate cancer ([place])   ? Ruptured lumbar disc   ? Skin cancer   ? ? ?Past Surgical History:  ?Procedure Laterality Date  ? BACK SURGERY    ? cataract Bilateral   ? COLONOSCOPY  10/09/2016  ? POLYPECTOMY    ? radioactive     ? radioactive seed implants for prostate CA.   ? ? ?Current Medications: ?Current Meds  ?Medication Sig  ? amLODipine (NORVASC) 5 MG tablet Take 5 mg by mouth daily.  ? apixaban (ELIQUIS) 5 MG TABS tablet Take 1 tablet (5 mg total) by mouth 2 (two) times daily.  ? Cholecalciferol (VITAMIN D3) 50 MCG (2000 UT) TABS Take 2,000 Units by mouth at  bedtime.  ? Fish Oil-Cholecalciferol (OMEGA-3 FISH OIL/VITAMIN D3 PO) Take 3 tablets by mouth every morning.  ? metoprolol tartrate (LOPRESSOR) 25 MG tablet Take 1 tablet (25 mg total) by mouth 2 (two) times daily. (Patient taking differently: Take 25 mg by mouth 2 (two) times daily. Per patient taking 1/2 tablet twice a day)  ? Multiple Vitamins-Minerals (OCUVITE PO) Take 1 tablet by mouth at bedtime.  ? pantoprazole (PROTONIX) 20 MG tablet TAKE 1 TABLET(20 MG) BY MOUTH DAILY  ? potassium chloride (KLOR-CON) 10 MEQ tablet Take 2 tablets (20 mEq total) by mouth daily.  ? predniSONE (STERAPRED UNI-PAK 21 TAB) 10 MG (21) TBPK tablet See admin instructions. follow package directions  ? rosuvastatin (CRESTOR) 20 MG tablet Take 1 tablet (20 mg total) by mouth daily.  ? sacubitril-valsartan (ENTRESTO) 24-26 MG Take 1 tablet by mouth 2 (two) times daily.  ? triamterene-hydrochlorothiazide (MAXZIDE-25) 37.5-25 MG tablet Take 1 tablet by mouth daily.  ?  ? ?Allergies:   Patient has no known allergies.  ? ?Social History  ? ?Socioeconomic History  ? Marital status: Married  ?  Spouse name: Not on file  ? Number of children: Not on file  ? Years of education: Not on file  ? Highest education level: Not on file  ?Occupational History  ? Not on file  ?Tobacco Use  ? Smoking status: Former  ?  Smokeless tobacco: Never  ? Tobacco comments:  ?  Former smoker 05/10/2021  ?Vaping Use  ? Vaping Use: Never used  ?Substance and Sexual Activity  ? Alcohol use: No  ? Drug use: No  ? Sexual activity: Not on file  ?Other Topics Concern  ? Not on file  ?Social History Narrative  ? Not on file  ? ?Social Determinants of Health  ? ?Financial Resource Strain: Not on file  ?Food Insecurity: Not on file  ?Transportation Needs: Not on file  ?Physical Activity: Not on file  ?Stress: Not on file  ?Social Connections: Not on file  ?  ? ?Family History: ?Kevin Day family history includes Breast cancer in his sister. There is no history of Colon  cancer, Esophageal cancer, Rectal cancer, Stomach cancer, or Colon polyps. ? ?ROS:   ?Please see Kevin history of present illness.    ?All other systems reviewed and are negative. ? ?EKGs/Labs/Other Studies Reviewed:   ? ?Kevin following studies were reviewed today: ?Echo 05/23/2021: ?1. Left ventricular ejection fraction, by estimation, is 45 to 50%. Kevin  ?left ventricle has mildly decreased function. Kevin left ventricle  ?demonstrates global hypokinesis. Left ventricular diastolic parameters are  ?consistent with Grade I diastolic  ?dysfunction (impaired relaxation).  ? 2. Right ventricular systolic function is normal. Kevin right ventricular  ?size is normal.  ? 3. Left atrial size was mildly dilated.  ? 4. Kevin mitral valve is normal in structure. Mild mitral valve  ?regurgitation. No evidence of mitral stenosis.  ? 5. Kevin aortic valve is tricuspid. There is moderate calcification of Kevin  ?aortic valve. There is moderate thickening of Kevin aortic valve. Aortic  ?valve regurgitation is mild to moderate. Moderate aortic valve stenosis.  ? 6. Aortic dilatation noted. There is mild dilatation of Kevin ascending  ?aorta, measuring 41 mm.  ? 7. Kevin inferior vena cava is normal in size with greater than 50%  ?respiratory variability, suggesting right atrial pressure of 3 mmHg.  ? ?Comparison(s): Prior images reviewed side by side. Kevin left ventricular  ?function is worsened. EF 55-60%; There is mild dilatation of Kevin aortic  ?root and of Kevin ascending aorta measuring 41 mm and 44m respectively.  ? ?EKG:  EKG is ordered today.  Kevin ekg ordered today demonstrates atrial fibrillation 67 bpm ? ?Recent Labs: ?04/01/2021: ALT 16; B Natriuretic Peptide 132.0 ?11/16/2021: BUN 28; Creatinine, Ser 1.48; Hemoglobin 16.8; Platelets 148; Potassium 3.7; Sodium 137  ?Recent Lipid Panel ?   ?Component Value Date/Time  ? CHOL 142 05/30/2019 0750  ? TRIG 151 (H) 05/30/2019 0750  ? HDL 39 (L) 05/30/2019 0750  ? CHOLHDL 3.6 05/30/2019 0750  ? LB[place] 77 05/30/2019 0750  ? ? ? ?Risk Assessment/Calculations:   ? ?CHA2DS2-VASc Score = 4  ? This indicates a 4.8% annual risk of stroke. ?Kevin Day score is based upon: ?CHF History: 0 ?HTN History: 1 ?Diabetes History: 0 ?Stroke History: 0 ?Vascular Disease History: 1 (aortic atherosclerosis) ?Age Score: 2 ?Gender Score: 0 ?  ? ? ?    ? ?Physical Exam:   ? ?VS:  BP 116/72   Pulse 73   Ht 6' (1.829 m)   Wt 211 lb (95.7 kg)   SpO2 96%   BMI 28.62 kg/m?    ? ?Wt Readings from Last 3 Encounters:  ?11/22/21 211 lb (95.7 kg)  ?11/16/21 208 lb 9.6 oz (94.6 kg)  ?06/24/21 206 lb 3.2 oz (93.5 kg)  ?  ? ?GEN:  Well nourished,  well developed pleasant elderly man in no acute distress ?HEENT: Normal ?NECK: No JVD; No carotid bruits ?LYMPHATICS: No lymphadenopathy ?CARDIAC: irregularly irregular very quickly Kevin medicine that you get up is very short you you literally wake up after, no murmurs, rubs, gallops ?RESPIRATORY:  Clear to auscultation without rales, wheezing or rhonchi  ?ABDOMEN: Soft, non-tender, non-distended ?MUSCULOSKELETAL:  No edema; No deformity  ?SKIN: Warm and dry ?NEUROLOGIC:  Alert and oriented x 3 ?PSYCHIATRIC:  Normal affect  ? ?ASSESSMENT:   ? ?1. Paroxysmal atrial fibrillation (HCC)   ?2. Secondary hypercoagulable state ([place])   ?3. Coronary artery disease of native artery of native heart with stable angina pectoris ([place])   ?4. HFrEF (heart failure with reduced ejection fraction) ([place])   ?5. Essential hypertension   ? ?PLAN:   ? ?In order of problems listed above: ? ?We discussed Kevin natural history of atrial fibrillation at length today.  I agree that elective cardioversion is appropriate and indicated in this patient with history of LV dysfunction.  He understands that if atrial fibrillation recurs, his options will include antiarrhythmic drug therapy, catheter-based therapies such as A-fib ablation, and a rate control strategy.  We discussed Kevin pros and cons of each treatment option and he  understands that these decisions can be made in Kevin future if and when atrial fibrillation recurs.  He will continue on oral anticoagulation with apixaban. ?Anticoagulated with apixaban.  Reports no missed doses.  W

## 2021-11-22 ENCOUNTER — Other Ambulatory Visit: Payer: Self-pay

## 2021-11-22 ENCOUNTER — Ambulatory Visit: Payer: PPO | Admitting: Cardiovascular Disease

## 2021-11-22 ENCOUNTER — Encounter: Payer: Self-pay | Admitting: Cardiovascular Disease

## 2021-11-22 VITALS — BP 116/72 | HR 73 | Ht 72.0 in | Wt 211.0 lb

## 2021-11-22 DIAGNOSIS — D6869 Other thrombophilia: Secondary | ICD-10-CM

## 2021-11-22 DIAGNOSIS — I25118 Atherosclerotic heart disease of native coronary artery with other forms of angina pectoris: Secondary | ICD-10-CM

## 2021-11-22 DIAGNOSIS — I10 Essential (primary) hypertension: Secondary | ICD-10-CM | POA: Diagnosis not present

## 2021-11-22 DIAGNOSIS — I480 Paroxysmal atrial fibrillation: Secondary | ICD-10-CM

## 2021-11-22 DIAGNOSIS — I5020 Unspecified systolic (congestive) heart failure: Secondary | ICD-10-CM | POA: Diagnosis not present

## 2021-11-22 NOTE — Patient Instructions (Signed)
Medication Instructions:  ?Your physician recommends that you continue on your current medications as directed. Please refer to the Current Medication list given to you today. ? ?*If you need a refill on your cardiac medications before your next appointment, please call your pharmacy* ? ? ?Lab Work: ?NONE ?If you have labs (blood work) drawn today and your tests are completely normal, you will receive your results only by: ?MyChart Message (if you have MyChart) OR ?A paper copy in the mail ?If you have any lab test that is abnormal or we need to change your treatment, we will call you to review the results. ? ? ?Testing/Procedures: ?ECHO (after 4/6 due to scheduled cardioversion) ?Your physician has requested that you have an echocardiogram. Echocardiography is a painless test that uses sound waves to create images of your heart. It provides your doctor with information about the size and shape of your heart and how well your heart?s chambers and valves are working. This procedure takes approximately one hour. There are no restrictions for this procedure. ? ? ? ?Follow-Up: ?At [facility], you and your health needs are our priority.  As part of our continuing mission to provide you with exceptional heart care, we have created designated Provider Care Teams.  These Care Teams include your primary Cardiologist (physician) and Advanced Practice Providers (APPs -  Physician Assistants and Nurse Practitioners) who all work together to provide you with the care you need, when you need it.  ? ?Your next appointment:   ?6 month(s) ? ?The format for your next appointment:   ?In Person ? ?Provider:   ?Richardson Dopp, PA-C     Then, Sherren Mocha, MD will plan to see you again in 1 year(s).  ? ?  ?

## 2021-11-23 ENCOUNTER — Encounter (HOSPITAL_COMMUNITY): Payer: Self-pay | Admitting: Cardiovascular Disease

## 2021-11-28 ENCOUNTER — Other Ambulatory Visit (HOSPITAL_BASED_OUTPATIENT_CLINIC_OR_DEPARTMENT_OTHER): Payer: Self-pay | Admitting: Family Medicine

## 2021-11-28 ENCOUNTER — Other Ambulatory Visit (HOSPITAL_COMMUNITY): Payer: Self-pay | Admitting: *Deleted

## 2021-11-28 DIAGNOSIS — I10 Essential (primary) hypertension: Secondary | ICD-10-CM

## 2021-11-28 MED ORDER — AMLODIPINE BESYLATE 10 MG PO TABS
10.0000 mg | ORAL_TABLET | Freq: Every day | ORAL | 3 refills | Status: DC
Start: 2021-11-28 — End: 2022-10-20

## 2021-11-28 MED ORDER — POTASSIUM CHLORIDE ER 10 MEQ PO TBCR: 20.0000 meq | EXTENDED_RELEASE_TABLET | Freq: Every day | ORAL | 6 refills | Status: DC

## 2021-11-29 ENCOUNTER — Other Ambulatory Visit (HOSPITAL_COMMUNITY): Payer: Self-pay | Admitting: Physician Assistant

## 2021-12-01 ENCOUNTER — Ambulatory Visit (HOSPITAL_COMMUNITY): Payer: PPO | Admitting: Certified Registered Nurse Anesthetist

## 2021-12-01 ENCOUNTER — Other Ambulatory Visit: Payer: Self-pay

## 2021-12-01 ENCOUNTER — Encounter (HOSPITAL_COMMUNITY): Payer: Self-pay | Admitting: Cardiovascular Disease

## 2021-12-01 ENCOUNTER — Encounter (HOSPITAL_COMMUNITY)
Admission: RE | Disposition: A | Discharge status: Home / Self Care | Payer: Self-pay | Source: Home / Self Care | Attending: Cardiovascular Disease

## 2021-12-01 ENCOUNTER — Ambulatory Visit (HOSPITAL_BASED_OUTPATIENT_CLINIC_OR_DEPARTMENT_OTHER): Payer: PPO | Admitting: Certified Registered Nurse Anesthetist

## 2021-12-01 ENCOUNTER — Ambulatory Visit (HOSPITAL_COMMUNITY)
Admission: RE | Admit: 2021-12-01 | Discharge: 2021-12-01 | Disposition: A | Discharge status: Home / Self Care | Payer: PPO | Attending: Cardiovascular Disease | Admitting: Cardiovascular Disease

## 2021-12-01 DIAGNOSIS — Z7901 Long term (current) use of anticoagulants: Secondary | ICD-10-CM | POA: Insufficient documentation

## 2021-12-01 DIAGNOSIS — Z87891 Personal history of nicotine dependence: Secondary | ICD-10-CM | POA: Diagnosis not present

## 2021-12-01 DIAGNOSIS — M1990 Unspecified osteoarthritis, unspecified site: Secondary | ICD-10-CM

## 2021-12-01 DIAGNOSIS — I10 Essential (primary) hypertension: Secondary | ICD-10-CM | POA: Diagnosis not present

## 2021-12-01 DIAGNOSIS — I4891 Unspecified atrial fibrillation: Secondary | ICD-10-CM

## 2021-12-01 DIAGNOSIS — I480 Paroxysmal atrial fibrillation: Secondary | ICD-10-CM | POA: Insufficient documentation

## 2021-12-01 DIAGNOSIS — I2510 Atherosclerotic heart disease of native coronary artery without angina pectoris: Secondary | ICD-10-CM | POA: Insufficient documentation

## 2021-12-01 DIAGNOSIS — Z79899 Other long term (current) drug therapy: Secondary | ICD-10-CM | POA: Insufficient documentation

## 2021-12-01 DIAGNOSIS — I4819 Other persistent atrial fibrillation: Secondary | ICD-10-CM | POA: Diagnosis present

## 2021-12-01 HISTORY — PX: CARDIOVERSION: SHX1299

## 2021-12-01 SURGERY — CARDIOVERSION
Anesthesia: General

## 2021-12-01 MED ORDER — PROPOFOL 10 MG/ML IV BOLUS
INTRAVENOUS | Status: DC | PRN
  Administered 2021-12-01: 80 mg via INTRAVENOUS

## 2021-12-01 MED ORDER — LIDOCAINE 2% (20 MG/ML) 5 ML SYRINGE
INTRAMUSCULAR | Status: DC | PRN
  Administered 2021-12-01: 100 mg via INTRAVENOUS

## 2021-12-01 MED ORDER — SODIUM CHLORIDE 0.9 % IV SOLN: INTRAVENOUS | Status: DC

## 2021-12-01 MED ORDER — SODIUM CHLORIDE 0.9 % IV SOLN
INTRAVENOUS | Status: AC | PRN
  Administered 2021-12-01: 500 mL via INTRAVENOUS

## 2021-12-01 NOTE — CV Procedure (Signed)
? ?  DIRECT CURRENT CARDIOVERSION ? ?NAME:  Kevin Day    ?MRN: [id number] ?DOB:  02/15/1941    ?ADMIT DATE: 12/01/2021 ? ?Indication:  Symptomatic atrial fibrillation ? ?Procedure Note:  The patient signed informed consent.  They have had had therapeutic anticoagulation with eliquis greater than 3 weeks.  Anesthesia was administered by Dr. Sabra Heck.  Adequate airway was maintained throughout and vital followed per protocol.  They were cardioverted x 1 with 200J of biphasic synchronized energy.  They converted to NSR.  There were no apparent complications.  The patient had normal neuro status and respiratory status post procedure with vitals stable as recorded elsewhere.   ? ?Follow up: They will continue on current medical therapy and follow up with cardiology as scheduled. ? ?[place] T. Audie Box, MD, [facility] ?[address]  ?[address], [address] ?[place], [place] [address] ?[phone]  ?9:11 AM ? ?

## 2021-12-01 NOTE — Discharge Instructions (Signed)

## 2021-12-01 NOTE — Anesthesia Preprocedure Evaluation (Signed)
Anesthesia Evaluation  ?Patient identified by MRN, date of birth, ID band ?Patient awake ? ? ? ?Reviewed: ?Allergy & Precautions, NPO status , Patient's Chart, lab work & pertinent test results ? ?Airway ?Mallampati: II ? ?TM Distance: >3 FB ?Neck ROM: Full ? ? ? Dental ?no notable dental hx. ? ?  ?Pulmonary ?neg pulmonary ROS, former smoker,  ?  ?Pulmonary exam normal ?breath sounds clear to auscultation ? ? ? ? ? ? Cardiovascular ?hypertension, Normal cardiovascular exam+ dysrhythmias Atrial Fibrillation  ?Rhythm:Regular Rate:Normal ? ? ?  ?Neuro/Psych ?negative neurological ROS ? negative psych ROS  ? GI/Hepatic ?negative GI ROS, Neg liver ROS,   ?Endo/Other  ?negative endocrine ROS ? Renal/GU ?negative Renal ROS  ?negative genitourinary ?  ?Musculoskeletal ? ?(+) Arthritis , Osteoarthritis,   ? Abdominal ?  ?Peds ?negative pediatric ROS ?(+)  Hematology ?negative hematology ROS ?(+)   ?Anesthesia Other Findings ? ? Reproductive/Obstetrics ?negative OB ROS ? ?  ? ? ? ? ? ? ? ? ? ? ? ? ? ?  ?  ? ? ? ? ? ? ? ? ?Anesthesia Physical ?Anesthesia Plan ? ?ASA: 3 ? ?Anesthesia Plan: General  ? ?Post-op Pain Management: Minimal or no pain anticipated  ? ?Induction: Intravenous ? ?PONV Risk Score and Plan: 2 and Treatment may vary due to age or medical condition ? ?Airway Management Planned: Mask ? ?Additional Equipment:  ? ?Intra-op Plan:  ? ?Post-operative Plan:  ? ?Informed Consent: I have reviewed the patients History and Physical, chart, labs and discussed the procedure including the risks, benefits and alternatives for the proposed anesthesia with the patient or authorized representative who has indicated his/her understanding and acceptance.  ? ? ? ?Dental advisory given ? ?Plan Discussed with: CRNA ? ?Anesthesia Plan Comments:   ? ? ? ? ? ? ?Anesthesia Quick Evaluation ? ?

## 2021-12-01 NOTE — Interval H&P Note (Signed)
History and Physical Interval Note: ? ?12/01/2021 ?8:53 AM ? ?Kevin Day  has presented today for surgery, with the diagnosis of AFIB.  The various methods of treatment have been discussed with the patient and family. After consideration of risks, benefits and other options for treatment, the patient has consented to  Procedure(s): ?CARDIOVERSION (N/A) as a surgical intervention.  The patient's history has been reviewed, patient examined, no change in status, stable for surgery.  I have reviewed the patient's chart and labs.  Questions were answered to the patient's satisfaction.   ? ?NPO for DCCV. On eliquis >3 weeks. No missed doses.  ? ?[place] T. Audie Box, MD, [facility] ?[address]  ?[address], [address] ?[place], [place] [address] ?[phone]  ?8:53 AM ? ? ?

## 2021-12-01 NOTE — Anesthesia Postprocedure Evaluation (Signed)
Anesthesia Post Note ? ?Patient: Kevin Day ? ?Procedure(s) Performed: CARDIOVERSION ? ?  ? ?Patient location during evaluation: PACU ?Anesthesia Type: General ?Level of consciousness: awake and alert ?Pain management: pain level controlled ?Vital Signs Assessment: post-procedure vital signs reviewed and stable ?Respiratory status: spontaneous breathing, nonlabored ventilation and respiratory function stable ?Cardiovascular status: blood pressure returned to baseline and stable ?Postop Assessment: no apparent nausea or vomiting ?Anesthetic complications: no ? ? ?No notable events documented. ? ?Last Vitals:  ?Vitals:  ? 12/01/21 0928 12/01/21 0941  ?BP: (!) 92/44 (!) 96/49  ?Pulse: (!) 52 (!) 52  ?Resp: 15 16  ?Temp:    ?SpO2: 93% 95%  ?  ?Last Pain:  ?Vitals:  ? 12/01/21 0918  ?TempSrc:   ?PainSc: 0-No pain  ? ? ?  ?  ?  ?  ?  ?  ? ?Lynda Rainwater ? ? ? ? ?

## 2021-12-01 NOTE — Anesthesia Procedure Notes (Signed)
Procedure Name: General with mask airway ?Date/Time: 12/01/2021 9:08 AM ?Performed by: Janace Litten, CRNA ?Pre-anesthesia Checklist: Patient identified, Emergency Drugs available, Suction available and Patient being monitored ?Patient Re-evaluated:Patient Re-evaluated prior to induction ?Oxygen Delivery Method: Ambu bag ?Preoxygenation: Pre-oxygenation with 100% oxygen ?Induction Type: IV induction ? ? ? ? ?

## 2021-12-01 NOTE — Transfer of Care (Signed)
Immediate Anesthesia Transfer of Care Note ? ?Patient: Kevin Day ? ?Procedure(s) Performed: CARDIOVERSION ? ?Patient Location: PACU and Endoscopy Unit ? ?Anesthesia Type:General ? ?Level of Consciousness: drowsy and responds to stimulation ? ?Airway & Oxygen Therapy: Patient Spontanous Breathing ? ?Post-op Assessment: Report given to RN and Post -op Vital signs reviewed and stable ? ?Post vital signs: Reviewed and stable ? ?Last Vitals:  ?Vitals Value Taken Time  ?BP    ?Temp    ?Pulse    ?Resp    ?SpO2    ? ? ?Last Pain:  ?Vitals:  ? 12/01/21 0845  ?TempSrc: Temporal  ?PainSc: 0-No pain  ?   ? ?  ? ?Complications: No notable events documented. ?

## 2021-12-02 ENCOUNTER — Encounter (HOSPITAL_COMMUNITY): Payer: Self-pay | Admitting: Cardiovascular Disease

## 2021-12-02 DIAGNOSIS — H35372 Puckering of macula, left eye: Secondary | ICD-10-CM | POA: Diagnosis not present

## 2021-12-02 DIAGNOSIS — H401132 Primary open-angle glaucoma, bilateral, moderate stage: Secondary | ICD-10-CM | POA: Diagnosis not present

## 2021-12-05 DIAGNOSIS — I10 Essential (primary) hypertension: Secondary | ICD-10-CM | POA: Diagnosis not present

## 2021-12-05 DIAGNOSIS — E785 Hyperlipidemia, unspecified: Secondary | ICD-10-CM | POA: Diagnosis not present

## 2021-12-05 DIAGNOSIS — E041 Nontoxic single thyroid nodule: Secondary | ICD-10-CM | POA: Diagnosis not present

## 2021-12-05 DIAGNOSIS — Z125 Encounter for screening for malignant neoplasm of prostate: Secondary | ICD-10-CM | POA: Diagnosis not present

## 2021-12-05 DIAGNOSIS — R7301 Impaired fasting glucose: Secondary | ICD-10-CM | POA: Diagnosis not present

## 2021-12-06 ENCOUNTER — Ambulatory Visit (HOSPITAL_COMMUNITY): Payer: PPO | Attending: Cardiology

## 2021-12-06 DIAGNOSIS — I480 Paroxysmal atrial fibrillation: Secondary | ICD-10-CM | POA: Insufficient documentation

## 2021-12-06 DIAGNOSIS — I25118 Atherosclerotic heart disease of native coronary artery with other forms of angina pectoris: Secondary | ICD-10-CM | POA: Insufficient documentation

## 2021-12-06 DIAGNOSIS — I5020 Unspecified systolic (congestive) heart failure: Secondary | ICD-10-CM | POA: Diagnosis not present

## 2021-12-06 DIAGNOSIS — D6869 Other thrombophilia: Secondary | ICD-10-CM | POA: Insufficient documentation

## 2021-12-06 LAB — ECHOCARDIOGRAM COMPLETE
Area-P 1/2: 2.81 cm2
P 1/2 time: 721 msec
S' Lateral: 3.5 cm

## 2021-12-07 ENCOUNTER — Encounter (HOSPITAL_COMMUNITY): Payer: Self-pay | Admitting: Physician Assistant

## 2021-12-07 ENCOUNTER — Ambulatory Visit (HOSPITAL_COMMUNITY)
Admission: RE | Admit: 2021-12-07 | Discharge: 2021-12-07 | Disposition: A | Discharge status: Home / Self Care | Payer: PPO | Source: Ambulatory Visit | Attending: Physician Assistant | Admitting: Physician Assistant

## 2021-12-07 VITALS — BP 114/70 | HR 60 | Ht 72.0 in | Wt 212.8 lb

## 2021-12-07 DIAGNOSIS — I4819 Other persistent atrial fibrillation: Secondary | ICD-10-CM | POA: Diagnosis not present

## 2021-12-07 DIAGNOSIS — E785 Hyperlipidemia, unspecified: Secondary | ICD-10-CM | POA: Insufficient documentation

## 2021-12-07 DIAGNOSIS — I10 Essential (primary) hypertension: Secondary | ICD-10-CM | POA: Insufficient documentation

## 2021-12-07 DIAGNOSIS — I700 Atherosclerosis of aorta: Secondary | ICD-10-CM | POA: Diagnosis not present

## 2021-12-07 DIAGNOSIS — I77819 Aortic ectasia, unspecified site: Secondary | ICD-10-CM | POA: Insufficient documentation

## 2021-12-07 DIAGNOSIS — D6869 Other thrombophilia: Secondary | ICD-10-CM

## 2021-12-07 DIAGNOSIS — Z79899 Other long term (current) drug therapy: Secondary | ICD-10-CM | POA: Diagnosis not present

## 2021-12-07 DIAGNOSIS — Z7901 Long term (current) use of anticoagulants: Secondary | ICD-10-CM | POA: Diagnosis not present

## 2021-12-07 NOTE — Progress Notes (Addendum)
? ? ?Primary Care Physician: Sueanne Margarita, DO ?Primary Cardiologist: Dr Burt Knack ?Primary Electrophysiologist: none ?Referring Physician: [facility] ? ? ?Kevin Day is a 81 y.o. male with a history of HLD, aortic atherosclerosis, dilated aortic root, HTN, atrial fibrillation who presents for follow up in the [facility].  The patient was initially diagnosed with atrial fibrillation 04/01/21 after presenting to the ED with symptoms of chest discomfort and SOB. ECG showed rate controlled afib. His K+ was 2.9. he was started on Eliquis for a CHADS2VASC score of 4, metoprolol, and KCL. Patient noticed by his apple watch that he went into afib Monday 11/14/21. He contacted Dr. Antionette Char office on that day and metoprolol was increased to 25 mg bid. He said if not for his apple watch, he would not have paid much attention to his heart rhythm. Afib  has occurred on the tail end  of 2 steroid tapers for neck pain, which is much better.  ? ?On follow up today, patient is s/p DCCV 12/01/21. He remains in SR today. He has felt well since the procedure. He brings in a log of BP and heart rates which show excellent control. No bleeding issues on anticoagulation.  ? ?Today, he denies symptoms of palpitations, shortness of breath, orthopnea, PND, lower extremity edema, dizziness, presyncope, syncope, snoring, daytime somnolence, bleeding, or neurologic sequela. The patient is tolerating medications without difficulties and is otherwise without complaint today.  ? ? ?Atrial Fibrillation Risk Factors: ? ?he does not have symptoms or diagnosis of sleep apnea. ?he does not have a history of rheumatic fever. ?he does not have a history of alcohol use. ? ? ?he has a BMI of Body mass index is 28.86 kg/m?Marland KitchenMarland Kitchen ?Filed Weights  ? 12/07/21 1030  ?Weight: 96.5 kg  ? ? ? ?Family History  ?Problem Relation Age of Onset  ? Breast cancer Sister   ? Colon cancer Neg Hx   ? Esophageal cancer Neg Hx   ? Rectal cancer Neg Hx    ? Stomach cancer Neg Hx   ? Colon polyps Neg Hx   ? ? ? ?Atrial Fibrillation Management history: ? ?Previous antiarrhythmic drugs: none ?Previous cardioversions: 12/01/21 ?Previous ablations: none ?CHADS2VASC score: 4 ?Anticoagulation history: Eliquis ? ? ?Past Medical History:  ?Diagnosis Date  ? Arthritis   ? Cataracts, bilateral   ? Hyperlipidemia   ? Hypertension   ? Prostate cancer ([place])   ? Prostate cancer ([place])   ? Prostate cancer ([place])   ? Ruptured lumbar disc   ? Skin cancer   ? ?Past Surgical History:  ?Procedure Laterality Date  ? BACK SURGERY    ? CARDIOVERSION N/A 12/01/2021  ? Procedure: CARDIOVERSION;  Surgeon: Geralynn Rile, MD;  Location: [facility];  Service: Cardiovascular;  Laterality: N/A;  ? cataract Bilateral   ? COLONOSCOPY  10/09/2016  ? POLYPECTOMY    ? radioactive     ? radioactive seed implants for prostate CA.   ? ? ?Current Outpatient Medications  ?Medication Sig Dispense Refill  ? amLODipine (NORVASC) 5 MG tablet Take 5 mg by mouth daily.    ? ELIQUIS 5 MG TABS tablet TAKE 1 TABLET(5 MG) BY MOUTH TWICE DAILY 60 tablet 11  ? Fish Oil-Cholecalciferol (OMEGA-3 FISH OIL/VITAMIN D3 PO) Take 3 tablets by mouth every morning.    ? metoprolol tartrate (LOPRESSOR) 25 MG tablet Take 1 tablet (25 mg total) by mouth 2 (two) times daily.    ? Multiple Vitamins-Minerals (OCUVITE  PO) Take 1 tablet by mouth at bedtime.    ? pantoprazole (PROTONIX) 20 MG tablet TAKE 1 TABLET(20 MG) BY MOUTH DAILY (Patient taking differently: Take 20 mg by mouth daily as needed for heartburn.) 90 tablet 2  ? potassium chloride (KLOR-CON) 10 MEQ tablet Take 2 tablets (20 mEq total) by mouth daily. 60 tablet 6  ? rosuvastatin (CRESTOR) 20 MG tablet Take 1 tablet (20 mg total) by mouth daily. 90 tablet 3  ? sacubitril-valsartan (ENTRESTO) 24-26 MG Take 1 tablet by mouth 2 (two) times daily. 60 tablet 9  ? triamterene-hydrochlorothiazide (MAXZIDE-25) 37.5-25 MG tablet Take 1 tablet by mouth daily.    ? ?No current  facility-administered medications for this encounter.  ? ? ?No Known Allergies ? ?Social History  ? ?Socioeconomic History  ? Marital status: Married  ?  Spouse name: Not on file  ? Number of children: Not on file  ? Years of education: Not on file  ? Highest education level: Not on file  ?Occupational History  ? Not on file  ?Tobacco Use  ? Smoking status: Former  ? Smokeless tobacco: Never  ? Tobacco comments:  ?  Former smoker 05/10/2021  ?Vaping Use  ? Vaping Use: Never used  ?Substance and Sexual Activity  ? Alcohol use: No  ? Drug use: No  ? Sexual activity: Not on file  ?Other Topics Concern  ? Not on file  ?Social History Narrative  ? Not on file  ? ?Social Determinants of Health  ? ?Financial Resource Strain: Not on file  ?Food Insecurity: Not on file  ?Transportation Needs: Not on file  ?Physical Activity: Not on file  ?Stress: Not on file  ?Social Connections: Not on file  ?Intimate Partner Violence: Not on file  ? ? ? ?ROS- All systems are reviewed and negative except as per the HPI above. ? ?Physical Exam: ?Vitals:  ? 12/07/21 1030  ?BP: 114/70  ?Pulse: 60  ?Weight: 96.5 kg  ?Height: 6' (1.829 m)  ? ? ?GEN- The patient is a well appearing male, alert and oriented x 3 today.   ?HEENT-head normocephalic, atraumatic, sclera clear, conjunctiva pink, hearing intact, trachea midline. ?Lungs- Clear to ausculation bilaterally, normal work of breathing ?Heart- Regular rate and rhythm, no murmurs, rubs or gallops  ?GI- soft, NT, ND, + BS ?Extremities- no clubbing, cyanosis, or edema ?MS- no significant deformity or atrophy ?Skin- no rash or lesion ?Psych- euthymic mood, full affect ?Neuro- strength and sensation are intact ? ? ?Wt Readings from Last 3 Encounters:  ?12/07/21 96.5 kg  ?12/01/21 95.7 kg  ?11/22/21 95.7 kg  ? ? ?EKG today demonstrates  ?SR, 1st degree AV block ?Vent. rate 60 BPM ?PR interval 202 ms ?QRS duration 102 ms ?QT/QTcB 408/408 ms ? ? ?Echo 03/20/21 demonstrated  ? 1. The left ventricle has  normal systolic function, with an ejection  ?fraction of 55-60%. The cavity size was normal. Left ventricular diastolic Doppler parameters are consistent with impaired relaxation.  ? 2. The average left ventricular global longitudinal strain is normal at  ?-18.8 %.  ? 3. The right ventricle has normal systolic function. The cavity was  ?normal. There is no increase in right ventricular wall thickness.  ? 4. There is mild mitral annular calcification present.  ? 5. The aortic valve has an indeterminate number of cusps. Moderate  ?sclerosis of the aortic valve. Aortic valve regurgitation is mild by color  ?flow Doppler.  ? 6. The aorta is abnormal in size and  structure.  ? 7. There is mild dilatation of the aortic root and of the ascending aorta  ?measuring 41 mm and 4m respectively.  ? ?Epic records are reviewed at length today ? ?CHA2DS2-VASc Score = 4  ?The patient's score is based upon: ?CHF History: 0 ?HTN History: 1 ?Diabetes History: 0 ?Stroke History: 0 ?Vascular Disease History: 1 (aortic atherosclerosis) ?Age Score: 2 ?Gender Score: 0 ?   ? ? ?ASSESSMENT AND PLAN: ?1. Persistent atrial fibrillation  ?The patient's CHA2DS2-VASc score is 4, indicating a 4.8% annual risk of stroke.   ?S/p DCCV on 12/01/21 ?Patient appears to be maintaining SR.  ?Continue Lopressor 25 mg BID. His BP and heart rate are WNL and he feels well at this dose.  ?Continue Eliquis 5 mg BID ? ?2. Secondary Hypercoagulable State (ICD10:  D68.69) ?The patient is at significant risk for stroke/thromboembolism based upon his CHA2DS2-VASc Score of 4.  Continue Apixaban (Eliquis).  ? ?3. HTN ?Stable, no changes today. ? ? ?Follow up with SRichardson Doppand Dr CBurt Knackper recalls.  ? ? ?Ricky Mykia Holton PA-C ?[facility] ?[facility]?[address]?[place] [id number]?[phone] ?

## 2021-12-15 ENCOUNTER — Other Ambulatory Visit: Payer: Self-pay | Admitting: Internal Medicine

## 2021-12-15 DIAGNOSIS — I25118 Atherosclerotic heart disease of native coronary artery with other forms of angina pectoris: Secondary | ICD-10-CM | POA: Diagnosis not present

## 2021-12-15 DIAGNOSIS — E785 Hyperlipidemia, unspecified: Secondary | ICD-10-CM | POA: Diagnosis not present

## 2021-12-15 DIAGNOSIS — R7303 Prediabetes: Secondary | ICD-10-CM | POA: Diagnosis not present

## 2021-12-15 DIAGNOSIS — Z1331 Encounter for screening for depression: Secondary | ICD-10-CM | POA: Diagnosis not present

## 2021-12-15 DIAGNOSIS — R12 Heartburn: Secondary | ICD-10-CM | POA: Diagnosis not present

## 2021-12-15 DIAGNOSIS — Z1389 Encounter for screening for other disorder: Secondary | ICD-10-CM | POA: Diagnosis not present

## 2021-12-15 DIAGNOSIS — R220 Localized swelling, mass and lump, head: Secondary | ICD-10-CM

## 2021-12-15 DIAGNOSIS — G4700 Insomnia, unspecified: Secondary | ICD-10-CM | POA: Diagnosis not present

## 2021-12-15 DIAGNOSIS — D692 Other nonthrombocytopenic purpura: Secondary | ICD-10-CM | POA: Diagnosis not present

## 2021-12-15 DIAGNOSIS — E041 Nontoxic single thyroid nodule: Secondary | ICD-10-CM

## 2021-12-15 DIAGNOSIS — D6869 Other thrombophilia: Secondary | ICD-10-CM | POA: Diagnosis not present

## 2021-12-15 DIAGNOSIS — Z0000 Encounter for general adult medical examination without abnormal findings: Secondary | ICD-10-CM | POA: Diagnosis not present

## 2021-12-15 DIAGNOSIS — I4891 Unspecified atrial fibrillation: Secondary | ICD-10-CM | POA: Diagnosis not present

## 2021-12-15 DIAGNOSIS — M170 Bilateral primary osteoarthritis of knee: Secondary | ICD-10-CM | POA: Diagnosis not present

## 2021-12-15 DIAGNOSIS — I10 Essential (primary) hypertension: Secondary | ICD-10-CM | POA: Diagnosis not present

## 2021-12-15 DIAGNOSIS — E876 Hypokalemia: Secondary | ICD-10-CM | POA: Diagnosis not present

## 2021-12-16 ENCOUNTER — Ambulatory Visit (HOSPITAL_COMMUNITY)
Admission: RE | Admit: 2021-12-16 | Discharge: 2021-12-16 | Disposition: A | Discharge status: Home / Self Care | Payer: PPO | Source: Ambulatory Visit | Attending: Physician Assistant | Admitting: Physician Assistant

## 2021-12-16 ENCOUNTER — Other Ambulatory Visit: Payer: Self-pay

## 2021-12-16 VITALS — BP 116/74 | HR 66 | Ht 72.0 in | Wt 212.8 lb

## 2021-12-16 DIAGNOSIS — I700 Atherosclerosis of aorta: Secondary | ICD-10-CM | POA: Diagnosis not present

## 2021-12-16 DIAGNOSIS — Z7901 Long term (current) use of anticoagulants: Secondary | ICD-10-CM | POA: Diagnosis not present

## 2021-12-16 DIAGNOSIS — I4819 Other persistent atrial fibrillation: Secondary | ICD-10-CM | POA: Insufficient documentation

## 2021-12-16 DIAGNOSIS — D6869 Other thrombophilia: Secondary | ICD-10-CM | POA: Diagnosis not present

## 2021-12-16 DIAGNOSIS — Z79899 Other long term (current) drug therapy: Secondary | ICD-10-CM | POA: Insufficient documentation

## 2021-12-16 DIAGNOSIS — E785 Hyperlipidemia, unspecified: Secondary | ICD-10-CM | POA: Diagnosis not present

## 2021-12-16 DIAGNOSIS — I10 Essential (primary) hypertension: Secondary | ICD-10-CM | POA: Diagnosis not present

## 2021-12-16 DIAGNOSIS — R42 Dizziness and giddiness: Secondary | ICD-10-CM | POA: Insufficient documentation

## 2021-12-16 NOTE — Progress Notes (Signed)
? ? ?Primary Care Physician: Sueanne Margarita, DO ?Primary Cardiologist: Dr Burt Knack ?Primary Electrophysiologist: none ?Referring Physician: [facility] ? ? ?Kevin Day is a 81 y.o. male with a history of HLD, aortic atherosclerosis, dilated aortic root, HTN, atrial fibrillation who presents for follow up in the [facility].  The patient was initially diagnosed with atrial fibrillation 04/01/21 after presenting to the ED with symptoms of chest discomfort and SOB. ECG showed rate controlled afib. His K+ was 2.9. he was started on Eliquis for a CHADS2VASC score of 4, metoprolol, and KCL. Patient noticed by his apple watch that he went into afib Monday 11/14/21. He contacted Dr. Antionette Char office on that day and metoprolol was increased to 25 mg bid. He said if not for his apple watch, he would not have paid much attention to his heart rhythm. Afib  has occurred on the tail end  of 2 steroid tapers for neck pain, which is much better.  ? ?On follow up today, patient is s/p DCCV 12/01/21. He noted on his smart watch that he was back in afib on 12/13/21. He does have more dizziness when in afib. There were no specific triggers that he could identify.   ? ?Today, he denies symptoms of palpitations, shortness of breath, orthopnea, PND, lower extremity edema, presyncope, syncope, snoring, daytime somnolence, bleeding, or neurologic sequela. The patient is tolerating medications without difficulties and is otherwise without complaint today.  ? ? ?Atrial Fibrillation Risk Factors: ? ?he does not have symptoms or diagnosis of sleep apnea. ?he does not have a history of rheumatic fever. ?he does not have a history of alcohol use. ? ? ?he has a BMI of Body mass index is 28.86 kg/m?Marland KitchenMarland Kitchen ?Filed Weights  ? 12/16/21 0853  ?Weight: 96.5 kg  ? ? ? ? ?Family History  ?Problem Relation Age of Onset  ? Breast cancer Sister   ? Colon cancer Neg Hx   ? Esophageal cancer Neg Hx   ? Rectal cancer Neg Hx   ? Stomach cancer  Neg Hx   ? Colon polyps Neg Hx   ? ? ? ?Atrial Fibrillation Management history: ? ?Previous antiarrhythmic drugs: none ?Previous cardioversions: 12/01/21 ?Previous ablations: none ?CHADS2VASC score: 4 ?Anticoagulation history: Eliquis ? ? ?Past Medical History:  ?Diagnosis Date  ? Arthritis   ? Cataracts, bilateral   ? Hyperlipidemia   ? Hypertension   ? Prostate cancer ([place])   ? Prostate cancer ([place])   ? Prostate cancer ([place])   ? Ruptured lumbar disc   ? Skin cancer   ? ?Past Surgical History:  ?Procedure Laterality Date  ? BACK SURGERY    ? CARDIOVERSION N/A 12/01/2021  ? Procedure: CARDIOVERSION;  Surgeon: Geralynn Rile, MD;  Location: [facility];  Service: Cardiovascular;  Laterality: N/A;  ? cataract Bilateral   ? COLONOSCOPY  10/09/2016  ? POLYPECTOMY    ? radioactive     ? radioactive seed implants for prostate CA.   ? ? ?Current Outpatient Medications  ?Medication Sig Dispense Refill  ? ELIQUIS 5 MG TABS tablet TAKE 1 TABLET(5 MG) BY MOUTH TWICE DAILY 60 tablet 11  ? Fish Oil-Cholecalciferol (OMEGA-3 FISH OIL/VITAMIN D3 PO) Take 3 tablets by mouth every morning.    ? metoprolol tartrate (LOPRESSOR) 25 MG tablet Take 1 tablet (25 mg total) by mouth 2 (two) times daily.    ? Multiple Vitamins-Minerals (OCUVITE PO) Take 1 tablet by mouth at bedtime.    ? pantoprazole (PROTONIX)  40 MG tablet Take 40 mg by mouth daily.    ? potassium chloride (KLOR-CON) 10 MEQ tablet Take 2 tablets (20 mEq total) by mouth daily. 60 tablet 6  ? rosuvastatin (CRESTOR) 20 MG tablet Take 1 tablet (20 mg total) by mouth daily. 90 tablet 3  ? sacubitril-valsartan (ENTRESTO) 24-26 MG Take 1 tablet by mouth 2 (two) times daily. 60 tablet 9  ? triamterene-hydrochlorothiazide (MAXZIDE-25) 37.5-25 MG tablet Take 1 tablet by mouth daily.    ? ?No current facility-administered medications for this encounter.  ? ? ?No Known Allergies ? ?Social History  ? ?Socioeconomic History  ? Marital status: Married  ?  Spouse name: Not on file  ?  Number of children: Not on file  ? Years of education: Not on file  ? Highest education level: Not on file  ?Occupational History  ? Not on file  ?Tobacco Use  ? Smoking status: Former  ? Smokeless tobacco: Never  ? Tobacco comments:  ?  Former smoker 05/10/2021  ?Vaping Use  ? Vaping Use: Never used  ?Substance and Sexual Activity  ? Alcohol use: No  ? Drug use: No  ? Sexual activity: Not on file  ?Other Topics Concern  ? Not on file  ?Social History Narrative  ? Not on file  ? ?Social Determinants of Health  ? ?Financial Resource Strain: Not on file  ?Food Insecurity: Not on file  ?Transportation Needs: Not on file  ?Physical Activity: Not on file  ?Stress: Not on file  ?Social Connections: Not on file  ?Intimate Partner Violence: Not on file  ? ? ? ?ROS- All systems are reviewed and negative except as per the HPI above. ? ?Physical Exam: ?Vitals:  ? 12/16/21 0853  ?BP: 116/74  ?Pulse: 66  ?Weight: 96.5 kg  ?Height: 6' (1.829 m)  ? ? ? ?GEN- The patient is a well appearing elderly male, alert and oriented x 3 today.   ?HEENT-head normocephalic, atraumatic, sclera clear, conjunctiva pink, hearing intact, trachea midline. ?Lungs- Clear to ausculation bilaterally, normal work of breathing ?Heart- irregular rate and rhythm, no murmurs, rubs or gallops  ?GI- soft, NT, ND, + BS ?Extremities- no clubbing, cyanosis, or edema ?MS- no significant deformity or atrophy ?Skin- no rash or lesion ?Psych- euthymic mood, full affect ?Neuro- strength and sensation are intact ? ? ?Wt Readings from Last 3 Encounters:  ?12/16/21 96.5 kg  ?12/07/21 96.5 kg  ?12/01/21 95.7 kg  ? ? ?EKG today demonstrates  ?Afib ?Vent. rate 66 BPM ?PR interval * ms ?QRS duration 96 ms ?QT/QTcB 382/400 ms ? ? ?Echo 03/20/21 demonstrated  ? 1. The left ventricle has normal systolic function, with an ejection  ?fraction of 55-60%. The cavity size was normal. Left ventricular diastolic Doppler parameters are consistent with impaired relaxation.  ? 2. The  average left ventricular global longitudinal strain is normal at  ?-18.8 %.  ? 3. The right ventricle has normal systolic function. The cavity was  ?normal. There is no increase in right ventricular wall thickness.  ? 4. There is mild mitral annular calcification present.  ? 5. The aortic valve has an indeterminate number of cusps. Moderate  ?sclerosis of the aortic valve. Aortic valve regurgitation is mild by color  ?flow Doppler.  ? 6. The aorta is abnormal in size and structure.  ? 7. There is mild dilatation of the aortic root and of the ascending aorta  ?measuring 41 mm and 66m respectively.  ? ?Epic records are reviewed at  length today ? ?CHA2DS2-VASc Score = 4  ?The patient's score is based upon: ?CHF History: 0 ?HTN History: 1 ?Diabetes History: 0 ?Stroke History: 0 ?Vascular Disease History: 1 (aortic atherosclerosis) ?Age Score: 2 ?Gender Score: 0 ?   ? ? ?ASSESSMENT AND PLAN: ?1. Persistent atrial fibrillation  ?The patient's CHA2DS2-VASc score is 4, indicating a 4.8% annual risk of stroke.   ?S/p DCCV on 12/01/21 with early return of afib.  ?We discussed rhythm control options today including AAD vs ablation. Would avoid class IC with moderate nonobstructive CAD. He is undecided between dofetilide or ablation. He is agreeable to consultation with EP to discuss both. Information sheet about dofetilide given today. We discussed possibility of rate control but given his increased dizziness, would not favor this option at this time.  ?Continue Lopressor 25 mg BID ?Continue Eliquis 5 mg BID ? ?2. Secondary Hypercoagulable State (ICD10:  D68.69) ?The patient is at significant risk for stroke/thromboembolism based upon his CHA2DS2-VASc Score of 4.  Continue Apixaban (Eliquis).  ? ?3. HTN ?Stable, no changes today. ? ? ?Follow up with EP for evaluation.  ? ? ?Ricky Debie Ashline PA-C ?[facility] ?[facility] ?[address] ?[place], [place] [address] ?[phone]  ?

## 2021-12-19 ENCOUNTER — Other Ambulatory Visit: Payer: Self-pay

## 2021-12-19 ENCOUNTER — Ambulatory Visit (HOSPITAL_BASED_OUTPATIENT_CLINIC_OR_DEPARTMENT_OTHER): Payer: Medicare Other | Admitting: Audiologist

## 2021-12-19 ENCOUNTER — Ambulatory Visit (HOSPITAL_BASED_OUTPATIENT_CLINIC_OR_DEPARTMENT_OTHER): Payer: Medicare Other | Admitting: Otolaryngology

## 2021-12-19 ENCOUNTER — Other Ambulatory Visit (HOSPITAL_COMMUNITY): Payer: Self-pay | Admitting: *Deleted

## 2021-12-19 MED ORDER — METOPROLOL TARTRATE 25 MG PO TABS
25.0000 mg | ORAL_TABLET | Freq: Two times a day (BID) | ORAL | 3 refills | Status: DC
Start: 2021-12-19 — End: 2021-12-22

## 2021-12-22 ENCOUNTER — Other Ambulatory Visit (HOSPITAL_COMMUNITY): Payer: Self-pay | Admitting: *Deleted

## 2021-12-22 MED ORDER — METOPROLOL TARTRATE 25 MG PO TABS: 25.0000 mg | ORAL_TABLET | Freq: Two times a day (BID) | ORAL | 3 refills | Status: DC

## 2021-12-24 IMAGING — CT CT ANGIO CHEST
2 of 6 series · 13 of 36 positions shown · IV contrast (iopamidol)
Comparison: 12/22/2019

CLINICAL DATA: Thoracic aortic aneurysm, follow-up, currently
asymptomatic

EXAM:
CT ANGIOGRAPHY CHEST WITH CONTRAST
TECHNIQUE: Multidetector CT imaging of the chest was performed using the
standard protocol during bolus administration of intravenous
contrast. Multiplanar CT image reconstructions and MIPs were
obtained to evaluate the vascular anatomy.
CONTRAST:  75mL A0QF22-7CI IOPAMIDOL (A0QF22-7CI) INJECTION 76%

[Series 5: cta thorax 2.00 bv36 s3 axial arterial · axial · arterial · 0.75mm/px · z∈[+1424,+1712]mm · 12 of 172 slices shown]
[im 14/172  lung]
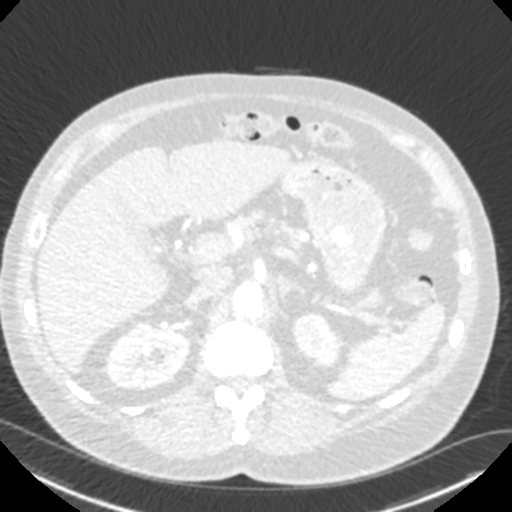
[im 27/172  mediastinal]
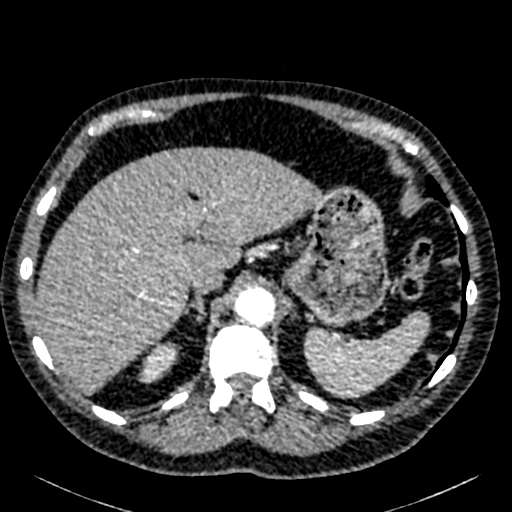
[im 40/172  lung]
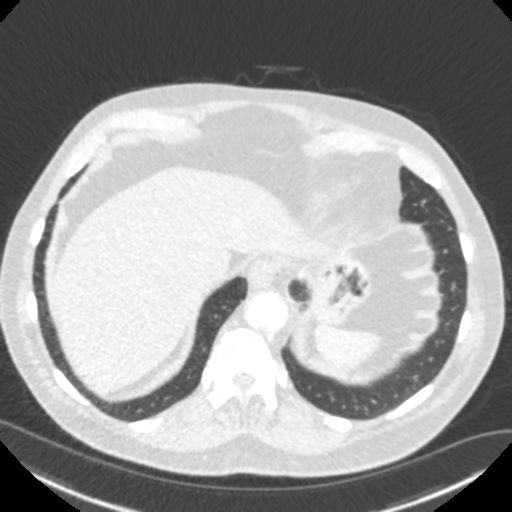
[im 53/172  mediastinal]
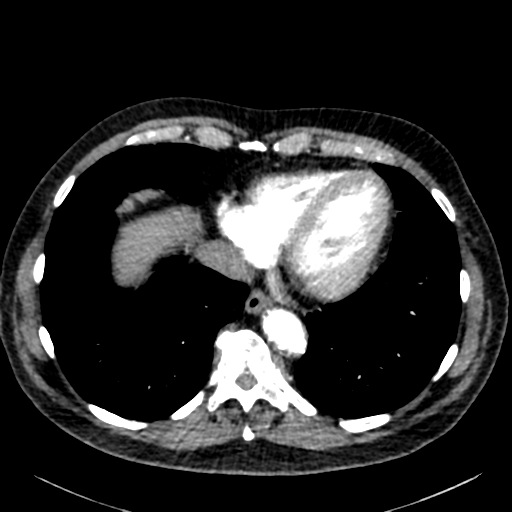
[im 66/172  lung]
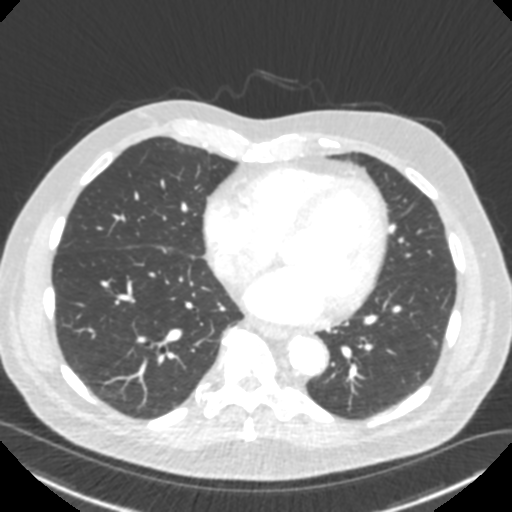
[im 79/172  mediastinal]
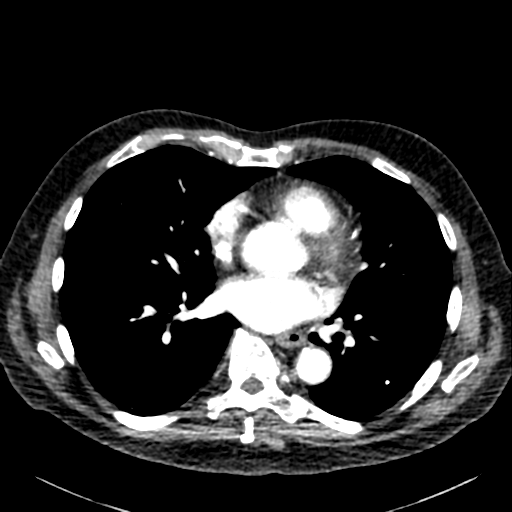
[im 93/172  lung]
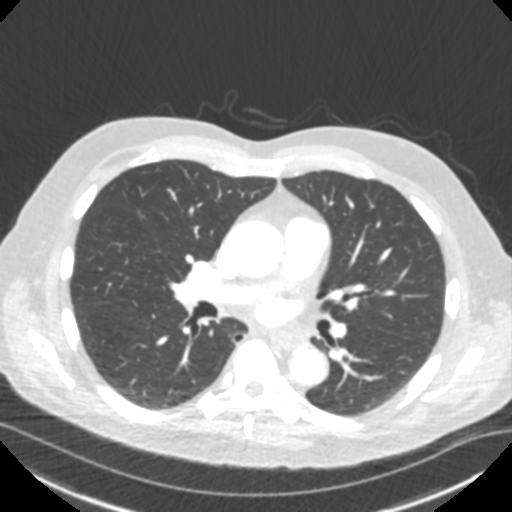
[im 106/172  mediastinal]
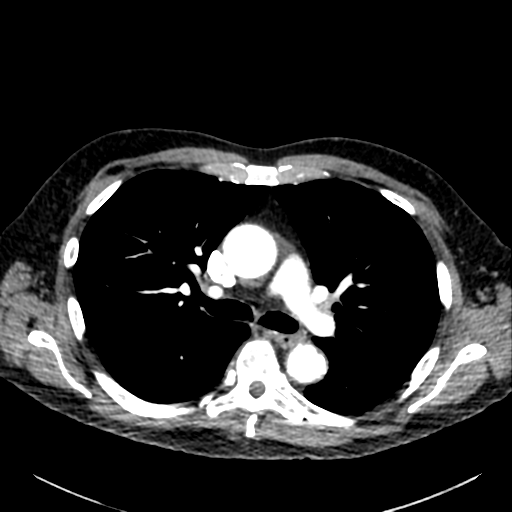
[im 119/172  lung]
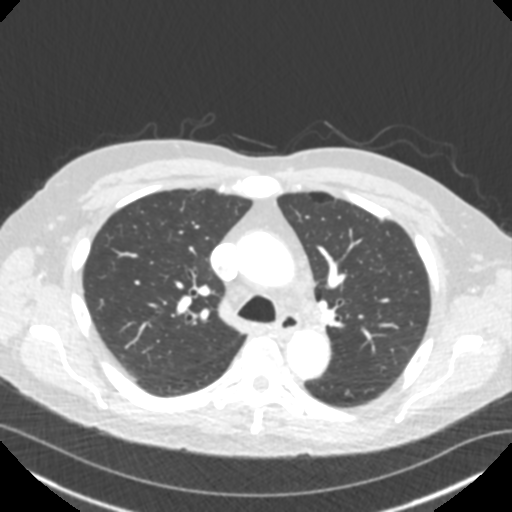
[im 132/172  mediastinal]
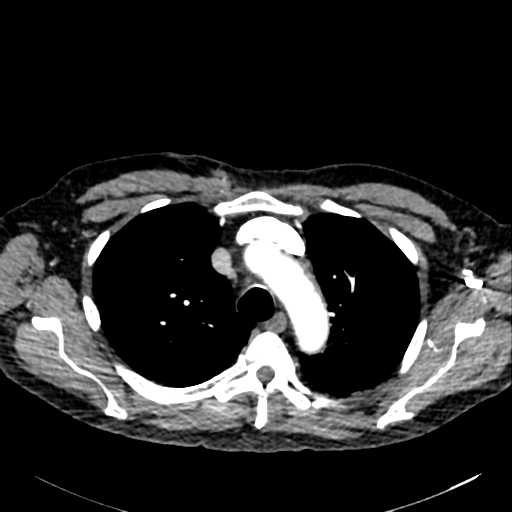
[im 145/172  lung]
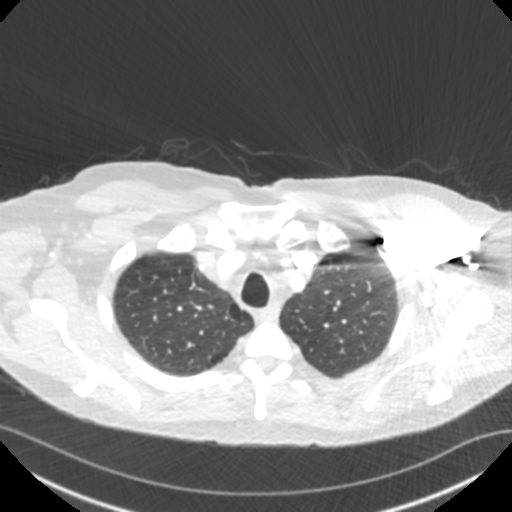
[im 158/172  mediastinal]
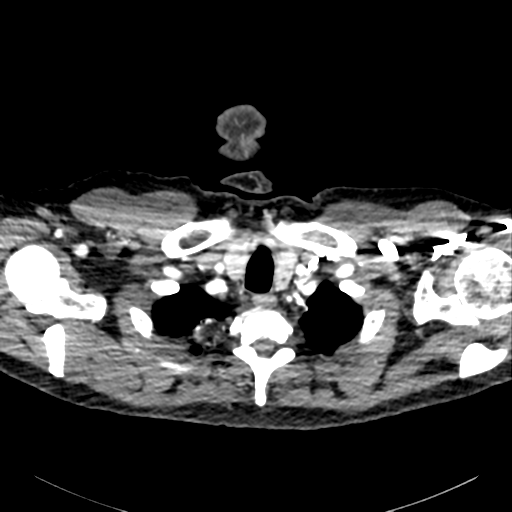

[Series 10: cta thorax 2.00 bv36 s3 cor st · coronal · 0.67mm/px · 1 of 193 slices shown]
[im 97/193  mediastinal]
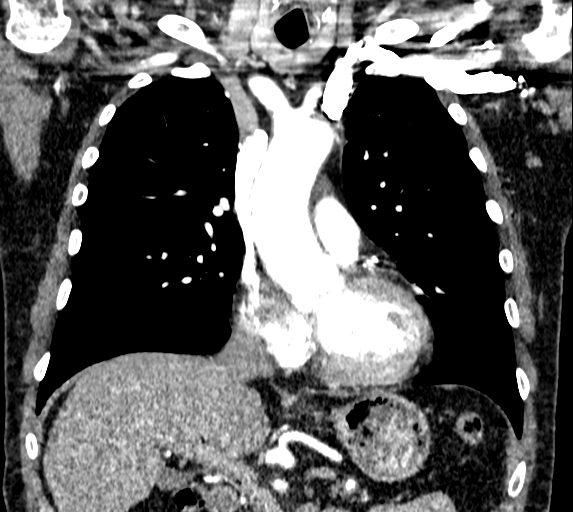

[13 of 36 positions shown; findings below may reference images not displayed]

FINDINGS: Cardiovascular: Heart size normal. No pericardial effusion.
Satisfactory opacification of pulmonary arteries noted, and there is
no evidence of pulmonary emboli. Coronary calcifications. Good
contrast opacification of the thoracic aorta. No dissection or
stenosis.

Aortic Root:

--Valve: 2.7 cm.  Coarse aortic valve leaflet calcifications.

--Sinuses: 3.9 cm

--Sinotubular Junction: 3.4 cm

Limitations by motion: Moderate

Thoracic Aorta:

--Ascending Aorta:

--Aortic Arch: 3.6 cm

--Descending Aorta:

Classic 3 vessel brachiocephalic arterial origin anatomy without
proximal stenosis. Moderate partially calcified plaque in the arch
and descending thoracic aorta. Moderate atheromatous plaque in the
visualized proximal abdominal aorta without aneurysm.

Mediastinum/Nodes: No mass or adenopathy.

Lungs/Pleura: No pleural effusion. Partially calcified biapical
pleural plaques. Stable small subpleural pulmonary nodules up to 5
mm, some calcified. No new nodule or infiltrate.

Upper Abdomen: No acute findings.

Musculoskeletal: No chest wall abnormality. No acute or significant
osseous findings.

Review of the MIP images confirms the above findings.
IMPRESSION: 1. Stable 4.2 cm ascending thoracic aortic aneurysm without
complicating features. Recommend annual imaging followup by CTA or
MRA. This recommendation follows 6010
ACCF/AHA/AATS/ACR/ASA/SCA/PRYWATNY/TOT/KEMA/AMAZIGH Guidelines for the
Diagnosis and Management of Patients with Thoracic Aortic Disease.
Circulation. 6010; 121: E266-e369. Aortic aneurysm NOS (FS4O5-WPG.H)
2. Coronary calcifications.

Aortic Atherosclerosis (FS4O5-M6W.W).

## 2021-12-29 ENCOUNTER — Encounter: Payer: Self-pay | Admitting: Cardiology

## 2021-12-29 ENCOUNTER — Ambulatory Visit: Payer: PPO | Admitting: Cardiology

## 2021-12-29 ENCOUNTER — Encounter: Payer: Self-pay | Admitting: *Deleted

## 2021-12-29 VITALS — BP 122/74 | HR 86 | Ht 72.0 in | Wt 197.6 lb

## 2021-12-29 DIAGNOSIS — D6869 Other thrombophilia: Secondary | ICD-10-CM | POA: Diagnosis not present

## 2021-12-29 DIAGNOSIS — Z01812 Encounter for preprocedural laboratory examination: Secondary | ICD-10-CM | POA: Diagnosis not present

## 2021-12-29 DIAGNOSIS — I4819 Other persistent atrial fibrillation: Secondary | ICD-10-CM | POA: Diagnosis not present

## 2021-12-29 LAB — BASIC METABOLIC PANEL
BUN/Creatinine Ratio: 22 (ref 10–24)
BUN: 33 mg/dL — ABNORMAL HIGH (ref 8–27)
CO2: 27 mmol/L (ref 20–29)
Calcium: 9.3 mg/dL (ref 8.6–10.2)
Chloride: 104 mmol/L (ref 96–106)
Creatinine, Ser: 1.47 mg/dL — ABNORMAL HIGH (ref 0.76–1.27)
Glucose: 106 mg/dL — ABNORMAL HIGH (ref 70–99)
Potassium: 4.6 mmol/L (ref 3.5–5.2)
Sodium: 139 mmol/L (ref 134–144)
eGFR: 48 mL/min/{1.73_m2} — ABNORMAL LOW (ref >59)

## 2021-12-29 LAB — CBC
Hematocrit: 48 % (ref 37.5–51.0)
Hemoglobin: 16.5 g/dL (ref 13.0–17.7)
MCH: 32.5 pg (ref 26.6–33.0)
MCHC: 34.4 g/dL (ref 31.5–35.7)
MCV: 95 fL (ref 79–97)
Platelets: 171 10*3/uL (ref 150–450)
RBC: 5.07 x10E6/uL (ref 4.14–5.80)
RDW: 16.1 % — ABNORMAL HIGH (ref 11.6–15.4)
WBC: 9.3 10*3/uL (ref 3.4–10.8)

## 2021-12-29 NOTE — Progress Notes (Signed)
? ?Electrophysiology Office Note ? ? ?Date:  12/29/2021  ? ?ID:  Kevin Day, DOB 1940/10/09, MRN [id number] ? ?PCP:  Sueanne Margarita, DO  ?Cardiologist:  Burt Knack ?Primary Electrophysiologist:  Ryliegh Mcduffey Meredith Leeds, MD   ? ?Chief Complaint: AF ?  ?History of Present Illness: ?Kevin Day is a 81 y.o. male who is being seen today for the evaluation of AF at the request of Fenton, Clint R, PA. Presenting today for electrophysiology evaluation. ? ?The history significant for hyperlipidemia, aortic atherosclerosis, dilated aortic root, hypertension, atrial fibrillation, coronary artery disease.  He was diagnosed with atrial fibrillation 04/01/2021 after presenting to the emergency room with chest discomfort and shortness of breath.  On 11/14/2021 his Apple Watch alerted him to atrial fibrillation.  Metoprolol was increased to 25 mg twice daily.  He had had 2 steroid tapers for neck pain.  Unfortunately more recently he has had more frequent episodes of A-fib.   ? ?Today, he denies symptoms of palpitations, chest pain, orthopnea, PND, lower extremity edema, claudication, dizziness, presyncope, syncope, bleeding, or neurologic sequela. The patient is tolerating medications without difficulties.  He unfortunately has shortness of breath, fatigue, mild chest discomfort.  His atrial fibrillation has become persistent.  He would like to get back into normal rhythm if possible. ? ? ?Past Medical History:  ?Diagnosis Date  ? Arthritis   ? Cataracts, bilateral   ? Hyperlipidemia   ? Hypertension   ? Prostate cancer ([place])   ? Prostate cancer ([place])   ? Prostate cancer ([place])   ? Ruptured lumbar disc   ? Skin cancer   ? ?Past Surgical History:  ?Procedure Laterality Date  ? BACK SURGERY    ? CARDIOVERSION N/A 12/01/2021  ? Procedure: CARDIOVERSION;  Surgeon: Geralynn Rile, MD;  Location: [facility];  Service: Cardiovascular;  Laterality: N/A;  ? cataract Bilateral   ? COLONOSCOPY  10/09/2016  ? POLYPECTOMY    ? radioactive     ?  radioactive seed implants for prostate CA.   ? ? ? ?Current Outpatient Medications  ?Medication Sig Dispense Refill  ? ELIQUIS 5 MG TABS tablet TAKE 1 TABLET(5 MG) BY MOUTH TWICE DAILY 60 tablet 11  ? Fish Oil-Cholecalciferol (OMEGA-3 FISH OIL/VITAMIN D3 PO) Take 3 tablets by mouth every morning.    ? metoprolol tartrate (LOPRESSOR) 25 MG tablet Take 25 mg by mouth 2 (two) times daily.    ? Multiple Vitamins-Minerals (OCUVITE PO) Take 1 tablet by mouth at bedtime.    ? pantoprazole (PROTONIX) 20 MG tablet Take 20 mg by mouth daily.    ? potassium chloride (KLOR-CON) 10 MEQ tablet Take 2 tablets (20 mEq total) by mouth daily. 60 tablet 6  ? rosuvastatin (CRESTOR) 20 MG tablet Take 1 tablet (20 mg total) by mouth daily. 90 tablet 3  ? sacubitril-valsartan (ENTRESTO) 24-26 MG Take 1 tablet by mouth 2 (two) times daily. 60 tablet 9  ? triamterene-hydrochlorothiazide (MAXZIDE-25) 37.5-25 MG tablet Take 1 tablet by mouth daily.    ? ?No current facility-administered medications for this visit.  ? ? ?Allergies:   Patient has no known allergies.  ? ?Social History:  The patient  reports that he has quit smoking. He has never used smokeless tobacco. He reports that he does not drink alcohol and does not use drugs.  ? ?Family History:  The patient's family history includes Breast cancer in his sister.  ? ? ?ROS:  Please see the history of present illness.   Otherwise, review of  systems is positive for none.   All other systems are reviewed and negative.  ? ? ?PHYSICAL EXAM: ?VS:  BP 122/74   Pulse 86   Ht 6' (1.829 m)   Wt 197 lb 9.6 oz (89.6 kg)   SpO2 96%   BMI 26.80 kg/m?  , BMI Body mass index is 26.8 kg/m?. ?GEN: Well nourished, well developed, in no acute distress  ?HEENT: normal  ?Neck: no JVD, carotid bruits, or masses ?Cardiac: irregular; no murmurs, rubs, or gallops,no edema  ?Respiratory:  clear to auscultation bilaterally, normal work of breathing ?GI: soft, nontender, nondistended, + BS ?MS: no deformity or  atrophy  ?Skin: warm and dry ?Neuro:  Strength and sensation are intact ?Psych: euthymic mood, full affect ? ?EKG:  EKG is not ordered today. ?Personal review of the ekg ordered 12/16/21 shows AF ? ?Recent Labs: ?04/01/2021: ALT 16; B Natriuretic Peptide 132.0 ?11/16/2021: BUN 28; Creatinine, Ser 1.48; Hemoglobin 16.8; Platelets 148; Potassium 3.7; Sodium 137  ? ? ?Lipid Panel  ?   ?Component Value Date/Time  ? CHOL 142 05/30/2019 0750  ? TRIG 151 (H) 05/30/2019 0750  ? HDL 39 (L) 05/30/2019 0750  ? CHOLHDL 3.6 05/30/2019 0750  ? [place] 77 05/30/2019 0750  ? ? ? ?Wt Readings from Last 3 Encounters:  ?12/29/21 197 lb 9.6 oz (89.6 kg)  ?12/16/21 212 lb 12.8 oz (96.5 kg)  ?12/07/21 212 lb 12.8 oz (96.5 kg)  ?  ? ? ?Other studies Reviewed: ?Additional studies/ records that were reviewed today include: TTE 12/06/21  ?Review of the above records today demonstrates:  ? 1. Left ventricular ejection fraction, by estimation, is 50 to 55%. The  ?left ventricle has low normal function. The left ventricle has no regional  ?wall motion abnormalities. Left ventricular diastolic parameters are  ?consistent with Grade I diastolic  ?dysfunction (impaired relaxation).  ? 2. Right ventricular systolic function is normal. The right ventricular  ?size is normal.  ? 3. The mitral valve is normal in structure. Mild mitral valve  ?regurgitation.  ? 4. The aortic valve is tricuspid. There is mild calcification of the  ?aortic valve. There is moderate thickening of the aortic valve. Aortic  ?valve regurgitation is mild. Aortic valve sclerosis/calcification is  ?present, without any evidence of aortic  ?stenosis.  ? 5. Aortic dilatation noted. There is mild dilatation of the aortic root,  ?measuring 40 mm. There is mild dilatation of the ascending aorta,  ?measuring 43 mm.  ? 6. The inferior vena cava is normal in size with greater than 50%  ?respiratory variability, suggesting right atrial pressure of 3 mmHg.  ? ?Coronary CTA 05/20/2021 ?1.  Coronary calcium score of 972 (LM 101, LAD 712, LCX 82, RCA 78). ?This was 29 percentile for age and sex matched control. ?  ?2. Normal coronary origin with right dominance. ?  ?3. Multivessel CAD with moderate to severe mid LAD stenosis, ?moderate mid circumflex. Sending for FFR. ?  ?4.  Aortic atherosclerosis. ? ?5.  CT FFR analysis didn't show any significant stenosis.  (LAD). ? ?ASSESSMENT AND PLAN: ? ?1.  Persistent atrial fibrillation: CHA2DS2-VASc of 4.  Currently on Eliquis 5 mg twice daily, metoprolol 25 mg twice daily.  He is well rate controlled but remains in atrial fibrillation.  He feels weak and fatigued.  He would like to avoid further medications for further therapy of his atrial fibrillation.  Due to that, we Nicole Hafley plan for ablation. ? ?Risk, benefits, and alternatives to EP study  and radiofrequency ablation for afib were also discussed in detail today. These risks include but are not limited to stroke, bleeding, vascular damage, tamponade, perforation, damage to the esophagus, lungs, and other structures, pulmonary vein stenosis, worsening renal function, and death. The patient understands these risk and wishes to proceed.  We Lenardo Westwood therefore proceed with catheter ablation at the next available time.  Carto, ICE, anesthesia are requested for the procedure.  Taunja Brickner also obtain CT PV protocol prior to the procedure to exclude LAA thrombus and further evaluate atrial anatomy.  ? ?2.  Secondary hypercoagulable state: Currently on Eliquis for atrial fibrillation as above. ? ?3.  Coronary artery disease: Coronary CTA showed moderate diffuse coronary disease with borderline FFR readings.  No current chest pain.  Continue with medical management. ? ?4.  Hypertension: Currently well controlled ? ?Case discussed with primary cardiology ? ?Current medicines are reviewed at length with the patient today.   ?The patient does not have concerns regarding his medicines.  The following changes were made today:   none ? ?Labs/ tests ordered today include:  ?Orders Placed This Encounter  ?Procedures  ? CT CARDIAC MORPH/PULM VEIN W/CM&W/O CA SCORE  ? Basic metabolic panel  ? CBC  ? ? ? ?Disposition:   FU with Nyree Applegate 3 mon

## 2021-12-29 NOTE — Patient Instructions (Signed)
Medication Instructions:  ?Your physician recommends that you continue on your current medications as directed. Please refer to the Current Medication list given to you today. ? ?*If you need a refill on your cardiac medications before your next appointment, please call your pharmacy* ? ? ?Lab Work: ?Pre procedure labs today:  BMP & CBC ? ?If you have labs (blood work) drawn today and your tests are completely normal, you will receive your results only by: ?MyChart Message (if you have MyChart) OR ?A paper copy in the mail ?If you have any lab test that is abnormal or we need to change your treatment, we will call you to review the results. ? ? ?Testing/Procedures: ?Your physician has requested that you have cardiac CT within 7 days PRIOR to your ablation. Cardiac computed tomography (CT) is a painless test that uses an x-ray machine to take clear, detailed pictures of your heart.  Please follow instruction below located under "other instructions". ?You will get a call from our office to schedule the date for this test. ? ?Your physician has recommended that you have an ablation. Catheter ablation is a medical procedure used to treat some cardiac arrhythmias (irregular heartbeats). During catheter ablation, a long, thin, flexible tube is put into a blood vessel in your groin (upper thigh), or neck. This tube is called an ablation catheter. It is then guided to your heart through the blood vessel. Radio frequency waves destroy small areas of heart tissue where abnormal heartbeats may cause an arrhythmia to start. Please follow instruction letter given to you today. ? ? ?Follow-Up: ?At [facility], you and your health needs are our priority.  As part of our continuing mission to provide you with exceptional heart care, we have created designated Provider Care Teams.  These Care Teams include your primary Cardiologist (physician) and Advanced Practice Providers (APPs -  Physician Assistants and Nurse Practitioners)  who all work together to provide you with the care you need, when you need it. ? ?Your next appointment:   ?1 month(s) after your ablation ? ?The format for your next appointment:   ?In Person ? ?Provider:   ?AFib clinic ? ? ?Thank you for choosing [facility]!! ? ? ?Trinidad Curet, RN ?[phone] ? ? ? ?Other Instructions ?                   ?Cardiac Ablation ?Cardiac ablation is a procedure to destroy (ablate) some heart tissue that is sending bad signals. These bad signals cause problems in heart rhythm. ?The heart has many areas that make these signals. If there are problems in these areas, they can make the heart beat in a way that is not normal. Destroying some tissues can help make the heart rhythm normal. ?Tell your doctor about: ?Any allergies you have. ?All medicines you are taking. These include vitamins, herbs, eye drops, creams, and over-the-counter medicines. ?Any problems you or family members have had with medicines that make you fall asleep (anesthetics). ?Any blood disorders you have. ?Any surgeries you have had. ?Any medical conditions you have, such as kidney failure. ?Whether you are pregnant or may be pregnant. ?What are the risks? ?This is a safe procedure. But problems may occur, including: ?Infection. ?Bruising and bleeding. ?Bleeding into the chest. ?Stroke or blood clots. ?Damage to nearby areas of your body. ?Allergies to medicines or dyes. ?The need for a pacemaker if the normal system is damaged. ?Failure of the procedure to treat the problem. ?What happens before the  procedure? ?Medicines ?Ask your doctor about: ?Changing or stopping your normal medicines. This is important. ?Taking aspirin and ibuprofen. Do not take these medicines unless your doctor tells you to take them. ?Taking other medicines, vitamins, herbs, and supplements. ?General instructions ?Follow instructions from your doctor about what you cannot eat or drink. ?Plan to have someone take you home from the hospital  or clinic. ?If you will be going home right after the procedure, plan to have someone with you for 24 hours. ?Ask your doctor what steps will be taken to prevent infection. ?What happens during the procedure? ? ?An IV tube will be put into one of your veins. ?You will be given a medicine to help you relax. ?The skin on your neck or groin will be numbed. ?A cut (incision) will be made in your neck or groin. A needle will be put through your cut and into a large vein. ?A tube (catheter) will be put into the needle. The tube will be moved to your heart. ?Dye may be put through the tube. This helps your doctor see your heart. ?Small devices (electrodes) on the tube will send out signals. ?A type of energy will be used to destroy some heart tissue. ?The tube will be taken out. ?Pressure will be held on your cut. This helps stop bleeding. ?A bandage will be put over your cut. ?The exact procedure may vary among doctors and hospitals. ?What happens after the procedure? ?You will be watched until you leave the hospital or clinic. This includes checking your heart rate, breathing rate, oxygen, and blood pressure. ?Your cut will be watched for bleeding. You will need to lie still for a few hours. ?Do not drive for 24 hours or as long as your doctor tells you. ?Summary ?Cardiac ablation is a procedure to destroy some heart tissue. This is done to treat heart rhythm problems. ?Tell your doctor about any medical conditions you may have. Tell him or her about all medicines you are taking to treat them. ?This is a safe procedure. But problems may occur. These include infection, bruising, bleeding, and damage to nearby areas of your body. ?Follow what your doctor tells you about food and drink. You may also be told to change or stop some of your medicines. ?After the procedure, do not drive for 24 hours or as long as your doctor tells you. ?This information is not intended to replace advice given to you by your health care  provider. Make sure you discuss any questions you have with your health care provider. ?Document Revised: 07/17/2019 Document Reviewed: 07/17/2019 ?Elsevier Patient Education ? [place]. ? ? ?

## 2021-12-29 NOTE — H&P (View-Only) (Signed)
? ?Electrophysiology Office Note ? ? ?Date:  12/29/2021  ? ?ID:  Kevin Day, DOB Dec 26, 1940, MRN [id number] ? ?PCP:  Kevin Margarita, DO  ?Cardiologist:  Kevin Day ?Primary Electrophysiologist:  Kevin Gillin Meredith Leeds, MD   ? ?Chief Complaint: AF ?  ?History of Present Illness: ?Kevin Day is a 81 y.o. male who is being seen today for the evaluation of AF at the request of Fenton, Clint R, PA. Presenting today for electrophysiology evaluation. ? ?The history significant for hyperlipidemia, aortic atherosclerosis, dilated aortic root, hypertension, atrial fibrillation, coronary artery disease.  He was diagnosed with atrial fibrillation 04/01/2021 after presenting to the emergency room with chest discomfort and shortness of breath.  On 11/14/2021 his Apple Watch alerted him to atrial fibrillation.  Metoprolol was increased to 25 mg twice daily.  He had had 2 steroid tapers for neck pain.  Unfortunately more recently he has had more frequent episodes of A-fib.   ? ?Today, he denies symptoms of palpitations, chest pain, orthopnea, PND, lower extremity edema, claudication, dizziness, presyncope, syncope, bleeding, or neurologic sequela. The patient is tolerating medications without difficulties.  He unfortunately has shortness of breath, fatigue, mild chest discomfort.  His atrial fibrillation has become persistent.  He would like to get back into normal rhythm if possible. ? ? ?Past Medical History:  ?Diagnosis Date  ? Arthritis   ? Cataracts, bilateral   ? Hyperlipidemia   ? Hypertension   ? Prostate cancer ([place])   ? Prostate cancer ([place])   ? Prostate cancer ([place])   ? Ruptured lumbar disc   ? Skin cancer   ? ?Past Surgical History:  ?Procedure Laterality Date  ? BACK SURGERY    ? CARDIOVERSION N/A 12/01/2021  ? Procedure: CARDIOVERSION;  Surgeon: Kevin Rile, MD;  Location: [facility];  Service: Cardiovascular;  Laterality: N/A;  ? cataract Bilateral   ? COLONOSCOPY  10/09/2016  ? POLYPECTOMY    ? radioactive     ?  radioactive seed implants for prostate CA.   ? ? ? ?Current Outpatient Medications  ?Medication Sig Dispense Refill  ? ELIQUIS 5 MG TABS tablet TAKE 1 TABLET(5 MG) BY MOUTH TWICE DAILY 60 tablet 11  ? Fish Oil-Cholecalciferol (OMEGA-3 FISH OIL/VITAMIN D3 PO) Take 3 tablets by mouth every morning.    ? metoprolol tartrate (LOPRESSOR) 25 MG tablet Take 25 mg by mouth 2 (two) times daily.    ? Multiple Vitamins-Minerals (OCUVITE PO) Take 1 tablet by mouth at bedtime.    ? pantoprazole (PROTONIX) 20 MG tablet Take 20 mg by mouth daily.    ? potassium chloride (KLOR-CON) 10 MEQ tablet Take 2 tablets (20 mEq total) by mouth daily. 60 tablet 6  ? rosuvastatin (CRESTOR) 20 MG tablet Take 1 tablet (20 mg total) by mouth daily. 90 tablet 3  ? sacubitril-valsartan (ENTRESTO) 24-26 MG Take 1 tablet by mouth 2 (two) times daily. 60 tablet 9  ? triamterene-hydrochlorothiazide (MAXZIDE-25) 37.5-25 MG tablet Take 1 tablet by mouth daily.    ? ?No current facility-administered medications for this visit.  ? ? ?Allergies:   Patient has no known allergies.  ? ?Social History:  The patient  reports that he has quit smoking. He has never used smokeless tobacco. He reports that he does not drink alcohol and does not use drugs.  ? ?Family History:  The patient's family history includes Breast cancer in his sister.  ? ? ?ROS:  Please see the history of present illness.   Otherwise, review of  systems is positive for none.   All other systems are reviewed and negative.  ? ? ?PHYSICAL EXAM: ?VS:  BP 122/74   Pulse 86   Ht 6' (1.829 m)   Wt 197 lb 9.6 oz (89.6 kg)   SpO2 96%   BMI 26.80 kg/m?  , BMI Body mass index is 26.8 kg/m?. ?GEN: Well nourished, well developed, in no acute distress  ?HEENT: normal  ?Neck: no JVD, carotid bruits, or masses ?Cardiac: irregular; no murmurs, rubs, or gallops,no edema  ?Respiratory:  clear to auscultation bilaterally, normal work of breathing ?GI: soft, nontender, nondistended, + BS ?MS: no deformity or  atrophy  ?Skin: warm and dry ?Neuro:  Strength and sensation are intact ?Psych: euthymic mood, full affect ? ?EKG:  EKG is not ordered today. ?Personal review of the ekg ordered 12/16/21 shows AF ? ?Recent Labs: ?04/01/2021: ALT 16; B Natriuretic Peptide 132.0 ?11/16/2021: BUN 28; Creatinine, Ser 1.48; Hemoglobin 16.8; Platelets 148; Potassium 3.7; Sodium 137  ? ? ?Lipid Panel  ?   ?Component Value Date/Time  ? CHOL 142 05/30/2019 0750  ? TRIG 151 (H) 05/30/2019 0750  ? HDL 39 (L) 05/30/2019 0750  ? CHOLHDL 3.6 05/30/2019 0750  ? [place] 77 05/30/2019 0750  ? ? ? ?Wt Readings from Last 3 Encounters:  ?12/29/21 197 lb 9.6 oz (89.6 kg)  ?12/16/21 212 lb 12.8 oz (96.5 kg)  ?12/07/21 212 lb 12.8 oz (96.5 kg)  ?  ? ? ?Other studies Reviewed: ?Additional studies/ records that were reviewed today include: TTE 12/06/21  ?Review of the above records today demonstrates:  ? 1. Left ventricular ejection fraction, by estimation, is 50 to 55%. The  ?left ventricle has low normal function. The left ventricle has no regional  ?wall motion abnormalities. Left ventricular diastolic parameters are  ?consistent with Grade I diastolic  ?dysfunction (impaired relaxation).  ? 2. Right ventricular systolic function is normal. The right ventricular  ?size is normal.  ? 3. The mitral valve is normal in structure. Mild mitral valve  ?regurgitation.  ? 4. The aortic valve is tricuspid. There is mild calcification of the  ?aortic valve. There is moderate thickening of the aortic valve. Aortic  ?valve regurgitation is mild. Aortic valve sclerosis/calcification is  ?present, without any evidence of aortic  ?stenosis.  ? 5. Aortic dilatation noted. There is mild dilatation of the aortic root,  ?measuring 40 mm. There is mild dilatation of the ascending aorta,  ?measuring 43 mm.  ? 6. The inferior vena cava is normal in size with greater than 50%  ?respiratory variability, suggesting right atrial pressure of 3 mmHg.  ? ?Coronary CTA 05/20/2021 ?1.  Coronary calcium score of 972 (LM 101, LAD 712, LCX 82, RCA 78). ?This was 55 percentile for age and sex matched control. ?  ?2. Normal coronary origin with right dominance. ?  ?3. Multivessel CAD with moderate to severe mid LAD stenosis, ?moderate mid circumflex. Sending for FFR. ?  ?4.  Aortic atherosclerosis. ? ?5.  CT FFR analysis didn't show any significant stenosis.  (LAD). ? ?ASSESSMENT AND PLAN: ? ?1.  Persistent atrial fibrillation: CHA2DS2-VASc of 4.  Currently on Eliquis 5 mg twice daily, metoprolol 25 mg twice daily.  He is well rate controlled but remains in atrial fibrillation.  He feels weak and fatigued.  He would like to avoid further medications for further therapy of his atrial fibrillation.  Due to that, we Yehudis Monceaux plan for ablation. ? ?Risk, benefits, and alternatives to EP study  and radiofrequency ablation for afib were also discussed in detail today. These risks include but are not limited to stroke, bleeding, vascular damage, tamponade, perforation, damage to the esophagus, lungs, and other structures, pulmonary vein stenosis, worsening renal function, and death. The patient understands these risk and wishes to proceed.  We Elayna Tobler therefore proceed with catheter ablation at the next available time.  Carto, ICE, anesthesia are requested for the procedure.  Janaiya Beauchesne also obtain CT PV protocol prior to the procedure to exclude LAA thrombus and further evaluate atrial anatomy.  ? ?2.  Secondary hypercoagulable state: Currently on Eliquis for atrial fibrillation as above. ? ?3.  Coronary artery disease: Coronary CTA showed moderate diffuse coronary disease with borderline FFR readings.  No current chest pain.  Continue with medical management. ? ?4.  Hypertension: Currently well controlled ? ?Case discussed with primary cardiology ? ?Current medicines are reviewed at length with the patient today.   ?The patient does not have concerns regarding his medicines.  The following changes were made today:   none ? ?Labs/ tests ordered today include:  ?Orders Placed This Encounter  ?Procedures  ? CT CARDIAC MORPH/PULM VEIN W/CM&W/O CA SCORE  ? Basic metabolic panel  ? CBC  ? ? ? ?Disposition:   FU with Bianna Haran 3 mon

## 2021-12-30 ENCOUNTER — Telehealth (HOSPITAL_COMMUNITY): Payer: Self-pay | Admitting: *Deleted

## 2021-12-30 NOTE — Telephone Encounter (Signed)
Attempted to call patient regarding upcoming cardiac CT appointment. ?Family member answered phone and message was left with name and call back number. ? ?Gordy Clement RN Navigator Cardiac Imaging ?[facility] ?[phone] Office ?[phone] Cell ? ? ?

## 2022-01-02 ENCOUNTER — Ambulatory Visit (HOSPITAL_COMMUNITY)
Admission: RE | Admit: 2022-01-02 | Discharge: 2022-01-02 | Disposition: A | Discharge status: Home / Self Care | Payer: PPO | Source: Ambulatory Visit | Attending: Cardiology | Admitting: Cardiology

## 2022-01-02 DIAGNOSIS — I4819 Other persistent atrial fibrillation: Secondary | ICD-10-CM | POA: Insufficient documentation

## 2022-01-02 MED ORDER — IOHEXOL 350 MG/ML SOLN
100.0000 mL | Freq: Once | INTRAVENOUS | Status: AC | PRN
  Administered 2022-01-02: 100 mL via INTRAVENOUS

## 2022-01-05 NOTE — Anesthesia Preprocedure Evaluation (Addendum)
Anesthesia Evaluation  ?Patient identified by MRN, date of birth, ID band ?Patient awake ? ? ? ?Reviewed: ?Allergy & Precautions, NPO status , Patient's Chart, lab work & pertinent test results ? ?Airway ?Mallampati: II ? ?TM Distance: >3 FB ? ? ? ? Dental ?  ?Pulmonary ?neg pulmonary ROS, former smoker,  ?  ?breath sounds clear to auscultation ? ? ? ? ? ? Cardiovascular ?hypertension,  ?Rhythm:Regular Rate:Normal ? ? ?  ?Neuro/Psych ?  ? GI/Hepatic ?negative GI ROS, Neg liver ROS,   ?Endo/Other  ?negative endocrine ROS ? Renal/GU ?negative Renal ROS  ? ?  ?Musculoskeletal ? ?(+) Arthritis ,  ? Abdominal ?  ?Peds ? Hematology ?  ?Anesthesia Other Findings ? ? Reproductive/Obstetrics ? ?  ? ? ? ? ? ? ? ? ? ? ? ? ? ?  ?  ? ? ? ? ? ? ? ?Anesthesia Physical ?Anesthesia Plan ? ?ASA: 3 ? ?Anesthesia Plan: General  ? ?Post-op Pain Management:   ? ?Induction: Intravenous ? ?PONV Risk Score and Plan: 2 and Ondansetron, Dexamethasone and Midazolam ? ?Airway Management Planned:  ? ?Additional Equipment:  ? ?Intra-op Plan:  ? ?Post-operative Plan: Extubation in OR ? ?Informed Consent: I have reviewed the patients History and Physical, chart, labs and discussed the procedure including the risks, benefits and alternatives for the proposed anesthesia with the patient or authorized representative who has indicated his/her understanding and acceptance.  ? ? ? ?Dental advisory given ? ?Plan Discussed with: Anesthesiologist and CRNA ? ?Anesthesia Plan Comments:   ? ? ? ? ? ?Anesthesia Quick Evaluation ? ?

## 2022-01-05 NOTE — Pre-Procedure Instructions (Signed)
Instructed patient on the following items: Arrival time 0530 Nothing to eat or drink after midnight No meds AM of procedure Responsible person to drive you home and stay with you for 24 hrs  Have you missed any doses of anti-coagulant Eliquis- hasn't missed any doses    

## 2022-01-06 ENCOUNTER — Ambulatory Visit (HOSPITAL_COMMUNITY): Payer: PPO | Admitting: Anesthesiology

## 2022-01-06 ENCOUNTER — Other Ambulatory Visit: Payer: Self-pay

## 2022-01-06 ENCOUNTER — Ambulatory Visit (HOSPITAL_COMMUNITY)
Admission: RE | Admit: 2022-01-06 | Discharge: 2022-01-06 | Disposition: A | Discharge status: Home / Self Care | Payer: PPO | Attending: Cardiology | Admitting: Cardiology

## 2022-01-06 ENCOUNTER — Ambulatory Visit (HOSPITAL_BASED_OUTPATIENT_CLINIC_OR_DEPARTMENT_OTHER): Payer: PPO | Admitting: Anesthesiology

## 2022-01-06 ENCOUNTER — Encounter (HOSPITAL_COMMUNITY)
Admission: RE | Disposition: A | Discharge status: Home / Self Care | Payer: Self-pay | Source: Home / Self Care | Attending: Cardiology

## 2022-01-06 DIAGNOSIS — Z87891 Personal history of nicotine dependence: Secondary | ICD-10-CM | POA: Diagnosis not present

## 2022-01-06 DIAGNOSIS — I10 Essential (primary) hypertension: Secondary | ICD-10-CM

## 2022-01-06 DIAGNOSIS — I2510 Atherosclerotic heart disease of native coronary artery without angina pectoris: Secondary | ICD-10-CM | POA: Diagnosis not present

## 2022-01-06 DIAGNOSIS — M1990 Unspecified osteoarthritis, unspecified site: Secondary | ICD-10-CM

## 2022-01-06 DIAGNOSIS — Z79899 Other long term (current) drug therapy: Secondary | ICD-10-CM | POA: Insufficient documentation

## 2022-01-06 DIAGNOSIS — D6869 Other thrombophilia: Secondary | ICD-10-CM | POA: Insufficient documentation

## 2022-01-06 DIAGNOSIS — I4819 Other persistent atrial fibrillation: Secondary | ICD-10-CM | POA: Diagnosis not present

## 2022-01-06 DIAGNOSIS — Z7901 Long term (current) use of anticoagulants: Secondary | ICD-10-CM | POA: Diagnosis not present

## 2022-01-06 DIAGNOSIS — I4891 Unspecified atrial fibrillation: Secondary | ICD-10-CM

## 2022-01-06 HISTORY — PX: ATRIAL FIBRILLATION ABLATION: EP1191

## 2022-01-06 SURGERY — ATRIAL FIBRILLATION ABLATION
Anesthesia: General

## 2022-01-06 MED ORDER — SODIUM CHLORIDE 0.9 % IV SOLN: INTRAVENOUS | Status: DC

## 2022-01-06 MED ORDER — SODIUM CHLORIDE 0.9 % IV SOLN: 250.0000 mL | INTRAVENOUS | Status: DC | PRN

## 2022-01-06 MED ORDER — ONDANSETRON HCL 4 MG/2ML IJ SOLN: 4.0000 mg | Freq: Four times a day (QID) | INTRAMUSCULAR | Status: DC | PRN

## 2022-01-06 MED ORDER — PHENYLEPHRINE HCL-NACL 20-0.9 MG/250ML-% IV SOLN
INTRAVENOUS | Status: DC | PRN
  Administered 2022-01-06: 25 ug/min via INTRAVENOUS

## 2022-01-06 MED ORDER — LIDOCAINE 2% (20 MG/ML) 5 ML SYRINGE
INTRAMUSCULAR | Status: DC | PRN
  Administered 2022-01-06: 80 mg via INTRAVENOUS

## 2022-01-06 MED ORDER — ACETAMINOPHEN 325 MG PO TABS
650.0000 mg | ORAL_TABLET | ORAL | Status: DC | PRN
  Filled 2022-01-06: qty 2

## 2022-01-06 MED ORDER — SODIUM CHLORIDE 0.9% FLUSH: 3.0000 mL | Freq: Two times a day (BID) | INTRAVENOUS | Status: DC

## 2022-01-06 MED ORDER — HEPARIN SODIUM (PORCINE) 1000 UNIT/ML IJ SOLN
INTRAMUSCULAR | Status: DC | PRN
  Administered 2022-01-06: 1000 [IU] via INTRAVENOUS

## 2022-01-06 MED ORDER — PROTAMINE SULFATE 10 MG/ML IV SOLN
INTRAVENOUS | Status: DC | PRN
  Administered 2022-01-06: 40 mg via INTRAVENOUS

## 2022-01-06 MED ORDER — SODIUM CHLORIDE 0.9 % IV SOLN: Freq: Once | INTRAVENOUS | Status: AC

## 2022-01-06 MED ORDER — PROPOFOL 10 MG/ML IV BOLUS
INTRAVENOUS | Status: DC | PRN
  Administered 2022-01-06: 130 mg via INTRAVENOUS

## 2022-01-06 MED ORDER — SUGAMMADEX SODIUM 200 MG/2ML IV SOLN
INTRAVENOUS | Status: DC | PRN
  Administered 2022-01-06: 200 mg via INTRAVENOUS

## 2022-01-06 MED ORDER — DEXAMETHASONE SODIUM PHOSPHATE 10 MG/ML IJ SOLN
INTRAMUSCULAR | Status: DC | PRN
  Administered 2022-01-06: 10 mg via INTRAVENOUS

## 2022-01-06 MED ORDER — HEPARIN SODIUM (PORCINE) 1000 UNIT/ML IJ SOLN
INTRAMUSCULAR | Status: DC | PRN
  Administered 2022-01-06 (×2): 2000 [IU] via INTRAVENOUS
  Administered 2022-01-06: 14000 [IU] via INTRAVENOUS

## 2022-01-06 MED ORDER — SODIUM CHLORIDE 0.9% FLUSH: 3.0000 mL | INTRAVENOUS | Status: DC | PRN

## 2022-01-06 MED ORDER — PHENYLEPHRINE 80 MCG/ML (10ML) SYRINGE FOR IV PUSH (FOR BLOOD PRESSURE SUPPORT)
PREFILLED_SYRINGE | INTRAVENOUS | Status: DC | PRN
  Administered 2022-01-06 (×3): 80 ug via INTRAVENOUS

## 2022-01-06 MED ORDER — FENTANYL CITRATE (PF) 250 MCG/5ML IJ SOLN
INTRAMUSCULAR | Status: DC | PRN
  Administered 2022-01-06: 100 ug via INTRAVENOUS

## 2022-01-06 MED ORDER — APIXABAN 5 MG PO TABS
5.0000 mg | ORAL_TABLET | ORAL | Status: AC
  Administered 2022-01-06: 5 mg via ORAL
  Filled 2022-01-06 (×2): qty 1

## 2022-01-06 MED ORDER — ROCURONIUM BROMIDE 10 MG/ML (PF) SYRINGE
PREFILLED_SYRINGE | INTRAVENOUS | Status: DC | PRN
  Administered 2022-01-06: 60 mg via INTRAVENOUS
  Administered 2022-01-06: 20 mg via INTRAVENOUS

## 2022-01-06 MED ORDER — ONDANSETRON HCL 4 MG/2ML IJ SOLN
INTRAMUSCULAR | Status: DC | PRN
  Administered 2022-01-06: 4 mg via INTRAVENOUS

## 2022-01-06 MED ORDER — HEPARIN (PORCINE) IN NACL 1000-0.9 UT/500ML-% IV SOLN
INTRAVENOUS | Status: AC
  Filled 2022-01-06: qty 2500

## 2022-01-06 MED ORDER — HEPARIN (PORCINE) IN NACL 1000-0.9 UT/500ML-% IV SOLN
INTRAVENOUS | Status: DC | PRN
  Administered 2022-01-06 (×5): 500 mL

## 2022-01-06 MED ORDER — HEPARIN SODIUM (PORCINE) 1000 UNIT/ML IJ SOLN
INTRAMUSCULAR | Status: AC
Start: 2022-01-06 — End: ?
  Filled 2022-01-06: qty 10

## 2022-01-06 MED ORDER — DOBUTAMINE INFUSION FOR EP/ECHO/NUC (1000 MCG/ML)
INTRAVENOUS | Status: AC
  Filled 2022-01-06: qty 250

## 2022-01-06 MED ORDER — DOBUTAMINE INFUSION FOR EP/ECHO/NUC (1000 MCG/ML)
INTRAVENOUS | Status: DC | PRN
  Administered 2022-01-06: 20 ug/kg/min via INTRAVENOUS

## 2022-01-06 SURGICAL SUPPLY — 22 items
BAG SNAP BAND KOVER 36X36 (MISCELLANEOUS) ×1 IMPLANT
BLANKET WARM UNDERBOD FULL ACC (MISCELLANEOUS) ×3 IMPLANT
CATH 8FR REPROCESSED SOUNDSTAR (CATHETERS) ×2 IMPLANT
CATH 8FR SOUNDSTAR REPROCESSED (CATHETERS) IMPLANT
CATH OCTARAY 2.0 F 3-3-3-3-3 (CATHETERS) ×1 IMPLANT
CATH S CIRCA THERM PROBE 10F (CATHETERS) ×1 IMPLANT
CATH SMTCH THERMOCOOL SF DF (CATHETERS) ×1 IMPLANT
CATH WEB BI DIR CSDF CRV REPRO (CATHETERS) IMPLANT
CATH WEBSTER BI DIR CS D-F CRV (CATHETERS) ×1 IMPLANT
CLOSURE PERCLOSE PROSTYLE (VASCULAR PRODUCTS) ×4 IMPLANT
COVER SWIFTLINK CONNECTOR (BAG) ×3 IMPLANT
KIT VERSACROSS STEERABLE D1 (CATHETERS) ×1 IMPLANT
PACK EP LATEX FREE (CUSTOM PROCEDURE TRAY) ×2
PACK EP LF (CUSTOM PROCEDURE TRAY) ×2 IMPLANT
PAD DEFIB RADIO PHYSIO CONN (PAD) ×3 IMPLANT
PATCH CARTO3 (PAD) ×1 IMPLANT
SHEATH CARTO VIZIGO SM CVD (SHEATH) ×1 IMPLANT
SHEATH PINNACLE 7F 10CM (SHEATH) ×1 IMPLANT
SHEATH PINNACLE 8F 10CM (SHEATH) ×2 IMPLANT
SHEATH PINNACLE 9F 10CM (SHEATH) ×1 IMPLANT
SHEATH PROBE COVER 6X72 (BAG) ×1 IMPLANT
TUBING SMART ABLATE COOLFLOW (TUBING) ×1 IMPLANT

## 2022-01-06 NOTE — Anesthesia Procedure Notes (Signed)
Procedure Name: Intubation ?Date/Time: 01/06/2022 7:46 AM ?Performed by: Lorie Phenix, CRNA ?Pre-anesthesia Checklist: Patient identified, Emergency Drugs available, Suction available and Patient being monitored ?Patient Re-evaluated:Patient Re-evaluated prior to induction ?Oxygen Delivery Method: Circle system utilized ?Preoxygenation: Pre-oxygenation with 100% oxygen ?Induction Type: IV induction ?Ventilation: Mask ventilation without difficulty ?Laryngoscope Size: Mac and 4 ?Grade View: Grade I ?Tube type: Oral ?Tube size: 7.5 mm ?Number of attempts: 1 ?Airway Equipment and Method: Stylet ?Placement Confirmation: ETT inserted through vocal cords under direct vision, positive ETCO2 and breath sounds checked- equal and bilateral ?Secured at: 24 cm ?Tube secured with: Tape ?Dental Injury: Teeth and Oropharynx as per pre-operative assessment  ? ? ? ? ?

## 2022-01-06 NOTE — Interval H&P Note (Signed)
History and Physical Interval Note: ? ?01/06/2022 ?7:08 AM ? ?Kevin Day  has presented today for surgery, with the diagnosis of afib.  The various methods of treatment have been discussed with the patient and family. After consideration of risks, benefits and other options for treatment, the patient has consented to  Procedure(s): ?[name] (N/A) as a surgical intervention.  The patient's history has been reviewed, patient examined, no change in status, stable for surgery.  I have reviewed the patient's chart and labs.  Questions were answered to the patient's satisfaction.   ? ? ?[name] ? ? ?

## 2022-01-06 NOTE — Anesthesia Postprocedure Evaluation (Signed)
Anesthesia Post Note ? ?Patient: Kevin Day ? ?Procedure(s) Performed: ATRIAL FIBRILLATION ABLATION ? ?  ? ?Patient location during evaluation: PACU ?Anesthesia Type: General ?Level of consciousness: awake ?Pain management: pain level controlled ?Vital Signs Assessment: post-procedure vital signs reviewed and stable ?Respiratory status: spontaneous breathing ?Cardiovascular status: stable ?Postop Assessment: no apparent nausea or vomiting ?Anesthetic complications: no ? ? ?There were no known notable events for this encounter. ? ?Last Vitals:  ?Vitals:  ? 01/06/22 1010 01/06/22 1015  ?BP: (!) 91/45 (!) 92/48  ?Pulse: (!) 59 (!) 59  ?Resp: 12 13  ?Temp:    ?SpO2: 96% 100%  ?  ?Last Pain:  ?Vitals:  ? 01/06/22 1009  ?TempSrc: Temporal  ?PainSc: 0-No pain  ? ? ?  ?  ?  ?  ?  ?  ? ?Lawton Dollinger ? ? ? ? ?

## 2022-01-06 NOTE — Transfer of Care (Signed)
Immediate Anesthesia Transfer of Care Note ? ?Patient: Kevin Day ? ?Procedure(s) Performed: ATRIAL FIBRILLATION ABLATION ? ?Patient Location: Cath Lab ? ?Anesthesia Type:General ? ?Level of Consciousness: awake and alert  ? ?Airway & Oxygen Therapy: Patient Spontanous Breathing and Patient connected to nasal cannula oxygen ? ?Post-op Assessment: Report given to RN and Post -op Vital signs reviewed and stable ? ?Post vital signs: Reviewed and stable ? ?Last Vitals:  ?Vitals Value Taken Time  ?BP 113/70 01/06/22 0945  ?Temp    ?Pulse 60 01/06/22 0948  ?Resp 17 01/06/22 0948  ?SpO2 96 % 01/06/22 0948  ?Vitals shown include unvalidated device data. ? ?Last Pain:  ?Vitals:  ? 01/06/22 0652  ?TempSrc: Oral  ?   ? ?  ? ?Complications: There were no known notable events for this encounter. ?

## 2022-01-06 NOTE — Progress Notes (Signed)
Upon receiving report from [facility] [place] and Everlene Farrier, CRNA, informed this RN that pt has "lipoma" to right neck area, Dr. Curt Bears aware and Dr Nyoka Cowden to bedside in Cath lab [facility], [address], no new orders obtained, "lipoma" also noted under pt's left breast area while completing 12 Lead EKG, pt states these are not new, safety maintained ?

## 2022-01-06 NOTE — Discharge Instructions (Addendum)
Post procedure care instructions ?No driving for 4 days. No lifting over 5 lbs for 1 week. No vigorous or sexual activity for 1 week. You may return to work/your usual activities on 01/14/22. Keep procedure site clean & dry. If you notice increased pain, swelling, bleeding or pus, call/return!  You may shower after 24 hours, but no soaking in baths/hot tubs/pools for 1 week.  ? ? ?You have an appointment set up with the [facility].  Multiple studies have shown that being followed by a dedicated atrial fibrillation clinic in addition to the standard care you receive from your other physicians improves health. We believe that enrollment in the atrial fibrillation clinic will allow [place] to better care for you.  ? ?The phone number to the [facility] is [phone]. The clinic is staffed Monday through Friday from 8:30am to 5pm. ? ?Parking Directions: The clinic is located in the Heart and Vascular Building connected to [facility]. ?1)From [address] turn on to [address] and go to the 3rd entrance  (Heart and Vascular entrance) on the right. ?2)Look to the right for Heart &Vascular Parking Garage. ?3)A code for the entrance is required, for June is 1004.   ?4)Take the elevators to the 1st floor. Registration is in the room with the glass walls at the end of the hallway. ? ?If you have any trouble parking or locating the clinic, please don?t hesitate to call [phone].  ? ?Cardiac Ablation, Care After ? ?This sheet gives you information about how to care for yourself after your procedure. Your health care provider may also give you more specific instructions. If you have problems or questions, contact your health care provider. ?What can I expect after the procedure? ?After the procedure, it is common to have: ?Bruising around your puncture site. ?Tenderness around your puncture site. ?Skipped heartbeats. ?Tiredness (fatigue). ? ?Follow these instructions at  home: ?Puncture site care  ?Follow instructions from your health care provider about how to take care of your puncture site. Make sure you: ?If present, leave stitches (sutures), skin glue, or adhesive strips in place. These skin closures may need to stay in place for up to 2 weeks. If adhesive strip edges start to loosen and curl up, you may trim the loose edges. Do not remove adhesive strips completely unless your health care provider tells you to do that. ?If a large square bandage is present, this may be removed 24 hours after surgery.  ?Check your puncture site every day for signs of infection. Check for: ?Redness, swelling, or pain. ?Fluid or blood. If your puncture site starts to bleed, lie down on your back, apply firm pressure to the area, and contact your health care provider. ?Warmth. ?Pus or a bad smell. ?A pea or small marble sized lump at the site is normal and can take up to three months to resolve.  ?Driving ?Do not drive for at least 4 days after your procedure or however long your health care provider recommends. (Do not resume driving if you have previously been instructed not to drive for other health reasons.) ?Do not drive or use heavy machinery while taking prescription pain medicine. ?Activity ?Avoid activities that take a lot of effort for at least 7 days after your procedure. ?Do not lift anything that is heavier than 5 lb (4.5 kg) for one week.  ?No sexual activity for 1 week.  ?Return to your normal activities as told by your health care provider. Ask your health  care provider what activities are safe for you. ?General instructions ?Take over-the-counter and prescription medicines only as told by your health care provider. ?Do not use any products that contain nicotine or tobacco, such as cigarettes and e-cigarettes. If you need help quitting, ask your health care provider. ?You may shower after 24 hours, but Do not take baths, swim, or use a hot tub for 1 week.  ?Do not drink alcohol for  24 hours after your procedure. ?Keep all follow-up visits as told by your health care provider. This is important. ?Contact a health care provider if: ?You have redness, mild swelling, or pain around your puncture site. ?You have fluid or blood coming from your puncture site that stops after applying firm pressure to the area. ?Your puncture site feels warm to the touch. ?You have pus or a bad smell coming from your puncture site. ?You have a fever. ?You have chest pain or discomfort that spreads to your neck, jaw, or arm. ?You are sweating a lot. ?You feel nauseous. ?You have a fast or irregular heartbeat. ?You have shortness of breath. ?You are dizzy or light-headed and feel the need to lie down. ?You have pain or numbness in the arm or leg closest to your puncture site. ?Get help right away if: ?Your puncture site suddenly swells. ?Your puncture site is bleeding and the bleeding does not stop after applying firm pressure to the area. ?These symptoms may represent a serious problem that is an emergency. Do not wait to see if the symptoms will go away. Get medical help right away. Call your local emergency services (911 in the [place] Do not drive yourself to the hospital. ?Summary ?After the procedure, it is normal to have bruising and tenderness at the puncture site in your groin, neck, or forearm. ?Check your puncture site every day for signs of infection. ?Get help right away if your puncture site is bleeding and the bleeding does not stop after applying firm pressure to the area. This is a medical emergency. ?This information is not intended to replace advice given to you by your health care provider. Make sure you discuss any questions you have with your health care provider. ? ? ?

## 2022-01-07 ENCOUNTER — Telehealth: Payer: Self-pay | Admitting: Student

## 2022-01-07 MED ORDER — COLCHICINE 0.6 MG PO TABS: 0.6000 mg | ORAL_TABLET | Freq: Two times a day (BID) | ORAL | 0 refills | Status: DC

## 2022-01-07 NOTE — Telephone Encounter (Signed)
? ? ?  The patient called the after-hours line reporting that he started to develop chest discomfort this morning after undergoing ablation yesterday. His chest discomfort is worse with inspiration and is not associated with exertion. I encouraged him to take Tylenol and I did reach out to Dr. Curt Bears and he recommended a course of Colchicine for 1 week. Suggested he could take 0.6 mg once daily but titrate to twice daily dosing if pain did not improve. I called the patient back and reviewed this information with him and he voiced understanding of the plan. He was appreciative of the return call. ? ?Signed, ?Erma Heritage, PA-C ?01/07/2022, 2:37 PM ? ?

## 2022-01-09 ENCOUNTER — Encounter (HOSPITAL_COMMUNITY): Payer: Self-pay | Admitting: Cardiology

## 2022-01-09 LAB — POCT ACTIVATED CLOTTING TIME
Activated Clotting Time: 317 seconds
Activated Clotting Time: 323 seconds

## 2022-01-10 NOTE — Progress Notes (Signed)
Pt has been made aware of normal result and verbalized understanding.  jw

## 2022-01-11 ENCOUNTER — Ambulatory Visit
Payer: Medicare Other | Attending: Student in an Organized Health Care Education/Training Program | Admitting: Student in an Organized Health Care Education/Training Program

## 2022-01-11 ENCOUNTER — Encounter (HOSPITAL_BASED_OUTPATIENT_CLINIC_OR_DEPARTMENT_OTHER): Payer: Medicare Other | Admitting: Dermatology

## 2022-01-11 ENCOUNTER — Encounter (HOSPITAL_BASED_OUTPATIENT_CLINIC_OR_DEPARTMENT_OTHER): Payer: Self-pay | Admitting: Student in an Organized Health Care Education/Training Program

## 2022-01-11 ENCOUNTER — Other Ambulatory Visit: Payer: Self-pay

## 2022-01-11 DIAGNOSIS — Z85828 Personal history of other malignant neoplasm of skin: Secondary | ICD-10-CM | POA: Diagnosis present

## 2022-01-11 DIAGNOSIS — L578 Other skin changes due to chronic exposure to nonionizing radiation: Secondary | ICD-10-CM | POA: Diagnosis present

## 2022-01-11 DIAGNOSIS — L570 Actinic keratosis: Secondary | ICD-10-CM | POA: Insufficient documentation

## 2022-01-11 DIAGNOSIS — D229 Melanocytic nevi, unspecified: Secondary | ICD-10-CM | POA: Insufficient documentation

## 2022-01-11 DIAGNOSIS — L820 Inflamed seborrheic keratosis: Secondary | ICD-10-CM | POA: Insufficient documentation

## 2022-01-11 DIAGNOSIS — Z85820 Personal history of malignant melanoma of skin: Secondary | ICD-10-CM | POA: Diagnosis present

## 2022-01-11 DIAGNOSIS — L814 Other melanin hyperpigmentation: Secondary | ICD-10-CM | POA: Diagnosis present

## 2022-01-11 DIAGNOSIS — L821 Other seborrheic keratosis: Secondary | ICD-10-CM | POA: Diagnosis present

## 2022-01-11 NOTE — Progress Notes (Signed)
HPI:     Eric Kemp is a 81 year old male w/ hx of melanoma and NMSC who presents for skin check.    Location: left dorsal hand lesion(s)  Duration: months  Symptoms: red, scaly  Timing: daily  Modifying factors: unknown  Treatments: none    Pt also c/o itchy red spot on right lower abdomen    No other dermatologic complaints or concerns today.    Personal history of non-melanoma skin cancer: yes  Personal history of melanoma skin cancer: yes  Family history of melanoma: no    Past Medical History:  No past medical history on file.    Current Medications:  Current Outpatient Medications   Medication Sig   ? amLODIPine (NORVASC) 10 MG tablet Take 1 tablet by mouth in the morning.     No current facility-administered medications for this visit.       Allergies:  Review of Patient's Allergies indicates:  No Known Allergies    Reviewed outside medical records: yes, reviewed [facility] records for melanoma history    Physical examination:    Examination of the following areas were performed:    '[x]'$  Scalp  '[x]'$  Head/Neck  '[x]'$  Chest  '[x]'$  Abdomen  '[x]'$  Back  '[x]'$  Upper extremities  '[x]'$  Lower extremities  '[x]'$  Hands/Fingernails  '[x]'$  Feet/Toenails  '[x]'$  Groin/Buttocks    All findings are within normal limits, EXCEPT:  Actinic changes  Waxy, brown, stuck-on papule(s)  Multiple symmetric, tan brown pigmented macules  Multiple symmetric, brown pigmented macules and papules  Scaly erythematous non-indurated macules located on the right temple and left dorsal hand  Erythematous stuck on papule on right lower abdomen    Assessment/Plan:    // Inflamed seborrheic keratoses, right lower abdomen  - explained benign etiology and pathogenesis  - patient requesting treatment due to symptomatic nature of lesion  - Cryodestruction of 1 lesions was performed after risks, benefits, and alternatives discussed and informed consent obtained    // Actinic keratoses  - Discussed benign nature of these lesions with the potential that <1% of lesions  can change into squamous cell carcinomas, a type of skin cancer  - Cryodestruction of 3 lesions was performed after risks, benefits, and alternatives discussed and informed consent obtained    // Chronic solar damage  - Reviewed ABCDEs of melanoma  - Discussed sun protective measures with patient including regular use of sunscreen with SPF 30 or greater and use of sun protective clothing    // Seborrheic keratosis  - counseled patient on benign clinical course  - continue to monitor    // Lentigos  - counseled patient on benign clinical course  - continue to monitor    // Nevi  - no clinical features concerning on exam today  - discussed ABCDEs of melanoma  - continue to monitor    // History of NMSC  - counseled on higher risk of developing subsequent skin cancers  -TBSE q 6 months    // Hx of melanoma  - Locations: right back  - Dates: unknown, prior to 2017  - Depth: in situ per notation but not confirmed with path report  - Stage: IA? (unclear, unable to find path report)     - Treatments: WLE (no SLN bx done)  -No evidence of mucocutaneous recurrence on clinical exam today; review of systems negative for pertinent constitutional symptoms  -No evidence of lymphadenopathy  -Recommended annual dilated eye exams and dental examinations  -Continue q3-6 month TBSE, next  exam scheduled  - ABCDEs of melanoma reviewed today; recommend patient share this information with all family members and advise first-degree relatives to see a dermatologist for skin cancer screening given familial risk  - Epic reminder message sent: yes  - Patient added to dermatology melanoma list: yes      Follow-up:    Patient to follow-up with any MD in 6 months for TBSE    Vita Erm, MD  01/11/2022    No images are attached to the encounter.

## 2022-01-12 ENCOUNTER — Encounter (HOSPITAL_BASED_OUTPATIENT_CLINIC_OR_DEPARTMENT_OTHER): Payer: Self-pay

## 2022-02-03 ENCOUNTER — Ambulatory Visit (HOSPITAL_BASED_OUTPATIENT_CLINIC_OR_DEPARTMENT_OTHER): Payer: Medicare Other | Admitting: Otolaryngology

## 2022-02-06 ENCOUNTER — Encounter (HOSPITAL_COMMUNITY): Payer: Self-pay | Admitting: Physician Assistant

## 2022-02-06 ENCOUNTER — Ambulatory Visit (HOSPITAL_COMMUNITY)
Admission: RE | Admit: 2022-02-06 | Discharge: 2022-02-06 | Disposition: A | Discharge status: Home / Self Care | Payer: PPO | Source: Ambulatory Visit | Attending: Physician Assistant | Admitting: Physician Assistant

## 2022-02-06 VITALS — BP 142/80 | HR 65 | Ht 72.0 in | Wt 211.8 lb

## 2022-02-06 DIAGNOSIS — D6869 Other thrombophilia: Secondary | ICD-10-CM | POA: Insufficient documentation

## 2022-02-06 DIAGNOSIS — Z79899 Other long term (current) drug therapy: Secondary | ICD-10-CM | POA: Diagnosis not present

## 2022-02-06 DIAGNOSIS — I4819 Other persistent atrial fibrillation: Secondary | ICD-10-CM | POA: Insufficient documentation

## 2022-02-06 DIAGNOSIS — I2510 Atherosclerotic heart disease of native coronary artery without angina pectoris: Secondary | ICD-10-CM | POA: Diagnosis not present

## 2022-02-06 DIAGNOSIS — I700 Atherosclerosis of aorta: Secondary | ICD-10-CM | POA: Insufficient documentation

## 2022-02-06 DIAGNOSIS — Z7901 Long term (current) use of anticoagulants: Secondary | ICD-10-CM | POA: Insufficient documentation

## 2022-02-06 DIAGNOSIS — E785 Hyperlipidemia, unspecified: Secondary | ICD-10-CM | POA: Insufficient documentation

## 2022-02-06 DIAGNOSIS — I10 Essential (primary) hypertension: Secondary | ICD-10-CM | POA: Diagnosis not present

## 2022-02-06 NOTE — Progress Notes (Signed)
Primary Care Physician: Sueanne Margarita, DO Primary Cardiologist: Dr Burt Knack Primary Electrophysiologist: Dr Curt Bears  Referring Physician: [facility]   Kevin Day is a 81 y.o. male with a history of HLD, aortic atherosclerosis, dilated aortic root, HTN, atrial fibrillation who presents for follow up in the [facility].  The patient was initially diagnosed with atrial fibrillation 04/01/21 after presenting to the ED with symptoms of chest discomfort and SOB. ECG showed rate controlled afib. His K+ was 2.9. he was started on Eliquis for a CHADS2VASC score of 4, metoprolol, and KCL. Patient noticed by his apple watch that he went into afib Monday 11/14/21. He contacted Dr. Antionette Char office on that day and metoprolol was increased to 25 mg bid. He said if not for his apple watch, he would not have paid much attention to his heart rhythm. Afib  has occurred on the tail end  of 2 steroid tapers for neck pain, which is much better. Patient is s/p DCCV 12/01/21. He noted on his smart watch that he was back in afib on 12/13/21.   On follow up today, patient is s/p afib ablation with Dr Curt Bears on 01/06/22. Patient reports that overall he feels much better with more energy. He is in SR today. He denies CP, swallowing issues, or groin issues. He has had intermittent hoarseness since the ablation. No throat pain.  Today, he denies symptoms of palpitations, shortness of breath, orthopnea, PND, lower extremity edema, presyncope, syncope, snoring, daytime somnolence, bleeding, or neurologic sequela. The patient is tolerating medications without difficulties and is otherwise without complaint today.    Atrial Fibrillation Risk Factors:  he does not have symptoms or diagnosis of sleep apnea. he does not have a history of rheumatic fever. he does not have a history of alcohol use.   he has a BMI of Body mass index is 28.73 kg/m.Marland Kitchen Filed Weights   02/06/22 1057  Weight: 96.1 kg      Family History  Problem Relation Age of Onset   Breast cancer Sister    Colon cancer Neg Hx    Esophageal cancer Neg Hx    Rectal cancer Neg Hx    Stomach cancer Neg Hx    Colon polyps Neg Hx      Atrial Fibrillation Management history:  Previous antiarrhythmic drugs: none Previous cardioversions: 12/01/21 Previous ablations: 01/06/22 CHADS2VASC score: 4 Anticoagulation history: Eliquis   Past Medical History:  Diagnosis Date   Arthritis    Cataracts, bilateral    Hyperlipidemia    Hypertension    Prostate cancer ([place])    Prostate cancer ([place])    Prostate cancer ([place])    Ruptured lumbar disc    Skin cancer    Past Surgical History:  Procedure Laterality Date   ATRIAL FIBRILLATION ABLATION N/A 01/06/2022   Procedure: ATRIAL FIBRILLATION ABLATION;  Surgeon: Constance Haw, MD;  Location: [facility];  Service: Cardiovascular;  Laterality: N/A;   BACK SURGERY     CARDIOVERSION N/A 12/01/2021   Procedure: CARDIOVERSION;  Surgeon: Geralynn Rile, MD;  Location: [facility];  Service: Cardiovascular;  Laterality: N/A;   cataract Bilateral    COLONOSCOPY  10/09/2016   POLYPECTOMY     radioactive      radioactive seed implants for prostate CA.     Current Outpatient Medications  Medication Sig Dispense Refill   ELIQUIS 5 MG TABS tablet TAKE 1 TABLET(5 MG) BY MOUTH TWICE DAILY 60 tablet 11  Fish Oil-Cholecalciferol (OMEGA-3 FISH OIL/VITAMIN D3 PO) Take 3 tablets by mouth every morning.     metoprolol tartrate (LOPRESSOR) 25 MG tablet Take 25 mg by mouth 2 (two) times daily.     Multiple Vitamins-Minerals (OCUVITE PO) Take 1 tablet by mouth at bedtime.     pantoprazole (PROTONIX) 20 MG tablet Take 20 mg by mouth daily.     potassium chloride (KLOR-CON) 10 MEQ tablet Take 2 tablets (20 mEq total) by mouth daily. 60 tablet 6   rosuvastatin (CRESTOR) 20 MG tablet Take 1 tablet (20 mg total) by mouth daily. 90 tablet 3   sacubitril-valsartan (ENTRESTO)  24-26 MG Take 1 tablet by mouth 2 (two) times daily. 60 tablet 9   triamterene-hydrochlorothiazide (MAXZIDE-25) 37.5-25 MG tablet Take 1 tablet by mouth daily.     No current facility-administered medications for this encounter.    No Known Allergies  Social History   Socioeconomic History   Marital status: Married    Spouse name: Not on file   Number of children: Not on file   Years of education: Not on file   Highest education level: Not on file  Occupational History   Not on file  Tobacco Use   Smoking status: Former   Smokeless tobacco: Never   Tobacco comments:    Former smoker 05/10/2021  Vaping Use   Vaping Use: Never used  Substance and Sexual Activity   Alcohol use: No   Drug use: No   Sexual activity: Not on file  Other Topics Concern   Not on file  Social History Narrative   Not on file   Social Determinants of Health   Financial Resource Strain: Not on file  Food Insecurity: Not on file  Transportation Needs: Not on file  Physical Activity: Not on file  Stress: Not on file  Social Connections: Not on file  Intimate Partner Violence: Not on file     ROS- All systems are reviewed and negative except as per the HPI above.  Physical Exam: Vitals:   02/06/22 1057  BP: (!) 142/80  Pulse: 65  Weight: 96.1 kg  Height: 6' (1.829 m)     GEN- The patient is a well appearing elderly male, alert and oriented x 3 today.   HEENT-head normocephalic, atraumatic, sclera clear, conjunctiva pink, hearing intact, trachea midline. Lungs- Clear to ausculation bilaterally, normal work of breathing Heart- Regular rate and rhythm, no murmurs, rubs or gallops  GI- soft, NT, ND, + BS Extremities- no clubbing, cyanosis, or edema MS- no significant deformity or atrophy Skin- no rash or lesion Psych- euthymic mood, full affect Neuro- strength and sensation are intact   Wt Readings from Last 3 Encounters:  02/06/22 96.1 kg  01/06/22 95.3 kg  12/29/21 89.6 kg     EKG today demonstrates  SR, 1st degree AV block Vent. rate 65 BPM PR interval 204 ms QRS duration 98 ms QT/QTcB 406/422 ms   Echo 03/20/21 demonstrated   1. The left ventricle has normal systolic function, with an ejection  fraction of 55-60%. The cavity size was normal. Left ventricular diastolic Doppler parameters are consistent with impaired relaxation.   2. The average left ventricular global longitudinal strain is normal at  -18.8 %.   3. The right ventricle has normal systolic function. The cavity was  normal. There is no increase in right ventricular wall thickness.   4. There is mild mitral annular calcification present.   5. The aortic valve has an indeterminate number of  cusps. Moderate  sclerosis of the aortic valve. Aortic valve regurgitation is mild by color  flow Doppler.   6. The aorta is abnormal in size and structure.   7. There is mild dilatation of the aortic root and of the ascending aorta  measuring 41 mm and 4m respectively.   Epic records are reviewed at length today  CHA2DS2-VASc Score = 4  The patient's score is based upon: CHF History: 0 HTN History: 1 Diabetes History: 0 Stroke History: 0 Vascular Disease History: 1 (aortic atherosclerosis) Age Score: 2 Gender Score: 0      ASSESSMENT AND PLAN: 1. Persistent atrial fibrillation  The patient's CHA2DS2-VASc score is 4, indicating a 4.8% annual risk of stroke.   S/p afib ablation 01/06/22 Patient appears to be maintaining SR. ? If hoarseness related to intubation. Will have him continue to monitor for the next couple weeks. If no improvement or symptoms worsen, will refer him to ENT for evaluation.  Continue Lopressor 25 mg BID Continue Eliquis 5 mg BID with no missed doses for 3 months post ablation.  2. Secondary Hypercoagulable State (ICD10:  D68.69) The patient is at significant risk for stroke/thromboembolism based upon his CHA2DS2-VASc Score of 4.  Continue Apixaban (Eliquis).   3.  HTN Stable, no changes today.  4. CAD No anginal symptoms. On statin   Follow up with Dr CCurt Bearsas scheduled.    [facility]14[address] High Ridge StreetGAtoka Wellington [phone]

## 2022-02-22 ENCOUNTER — Other Ambulatory Visit (HOSPITAL_COMMUNITY): Payer: Self-pay | Admitting: *Deleted

## 2022-02-22 MED ORDER — METOPROLOL TARTRATE 25 MG PO TABS: 25.0000 mg | ORAL_TABLET | Freq: Two times a day (BID) | ORAL | 1 refills | Status: DC

## 2022-02-27 ENCOUNTER — Telehealth (HOSPITAL_BASED_OUTPATIENT_CLINIC_OR_DEPARTMENT_OTHER): Payer: Self-pay | Admitting: Family Medicine

## 2022-02-27 DIAGNOSIS — U071 COVID-19: Secondary | ICD-10-CM

## 2022-02-27 MED ORDER — NIRMATRELVIR&RITONAVIR 300/100 20 X 150 MG & 10 X 100MG PO TBPK
ORAL_TABLET | ORAL | 0 refills | Status: AC
Start: 2022-02-27 — End: 2022-03-04

## 2022-02-27 NOTE — Telephone Encounter (Signed)
On call note    Tested positive covid today  HA, stuffy nose, fever  Wife has it too, she was treated.  No chest pain, no shortness of breath   The symptoms started today  Meds - amlodipine  Chart - nl LFT's, nl GFR    1. COVID  Reviewed meds - no interactions.  Will treat, age 81.  Reviewed reasons to go to ED.   - nirmatrelvir-ritonavir (PAXLOVID) 300 & 100 mg tablet therapy pack; Take 300 mg nirmatrelvir (two 150 mg pink tabs) with 100 mg ritonavir (one 100 mg white tab), with all three tablets taken together twice daily for 5 days  Dispense: 30 tablet; Refill: 0

## 2022-03-06 ENCOUNTER — Ambulatory Visit
Admission: RE | Admit: 2022-03-06 | Discharge: 2022-03-06 | Disposition: A | Discharge status: Home / Self Care | Payer: PPO | Source: Ambulatory Visit | Attending: Internal Medicine | Admitting: Internal Medicine

## 2022-03-06 DIAGNOSIS — E041 Nontoxic single thyroid nodule: Secondary | ICD-10-CM

## 2022-03-06 DIAGNOSIS — R220 Localized swelling, mass and lump, head: Secondary | ICD-10-CM

## 2022-03-06 DIAGNOSIS — E042 Nontoxic multinodular goiter: Secondary | ICD-10-CM | POA: Diagnosis not present

## 2022-03-10 DIAGNOSIS — D170 Benign lipomatous neoplasm of skin and subcutaneous tissue of head, face and neck: Secondary | ICD-10-CM | POA: Diagnosis not present

## 2022-03-16 DIAGNOSIS — M1712 Unilateral primary osteoarthritis, left knee: Secondary | ICD-10-CM | POA: Diagnosis not present

## 2022-03-16 DIAGNOSIS — M1711 Unilateral primary osteoarthritis, right knee: Secondary | ICD-10-CM | POA: Diagnosis not present

## 2022-03-17 DIAGNOSIS — H401131 Primary open-angle glaucoma, bilateral, mild stage: Secondary | ICD-10-CM | POA: Diagnosis not present

## 2022-03-24 DIAGNOSIS — M170 Bilateral primary osteoarthritis of knee: Secondary | ICD-10-CM | POA: Diagnosis not present

## 2022-03-30 DIAGNOSIS — M170 Bilateral primary osteoarthritis of knee: Secondary | ICD-10-CM | POA: Diagnosis not present

## 2022-04-10 ENCOUNTER — Ambulatory Visit: Payer: PPO | Admitting: Cardiology

## 2022-04-10 ENCOUNTER — Encounter: Payer: Self-pay | Admitting: Cardiology

## 2022-04-10 VITALS — BP 112/74 | HR 64 | Ht 72.0 in | Wt 211.2 lb

## 2022-04-10 DIAGNOSIS — R490 Dysphonia: Secondary | ICD-10-CM

## 2022-04-10 DIAGNOSIS — I4819 Other persistent atrial fibrillation: Secondary | ICD-10-CM

## 2022-04-10 NOTE — Progress Notes (Signed)
Electrophysiology Office Note   Date:  04/10/2022   ID:  Kevin Day, DOB 1941/06/16, MRN [id number]  PCP:  Sueanne Margarita, DO  Cardiologist:  Burt Knack Primary Electrophysiologist:  Yeila Morro Meredith Leeds, MD    Chief Complaint: AF   History of Present Illness: Kevin Day is a 81 y.o. male who is being seen today for the evaluation of AF at the request of Sueanne Margarita, DO. Presenting today for electrophysiology evaluation.  Has a history significant for hyperlipidemia, aortic atherosclerosis, dilated aortic root, hypertension, atrial fibrillation, coronary artery disease.  He was diagnosed with atrial fibrillation 04/01/2021.  He had fatigue and shortness of breath.  He is now status post atrial fibrillation ablation 01/06/2022.  Today, denies symptoms of palpitations, chest pain, shortness of breath, orthopnea, PND, lower extremity edema, claudication, dizziness, presyncope, syncope, bleeding, or neurologic sequela. The patient is tolerating medications without difficulties.  Since his ablation he has done well.  He has had no chest pain or shortness of breath.  He is able to all of his daily activities without restriction.  He is overall quite happy with his control.  He has a shortness of breath.  He does note that he has been hoarse since the ablation.  He states that at times his voice is normal, but over time he feels more hoarse.   Past Medical History:  Diagnosis Date   Arthritis    Cataracts, bilateral    Hyperlipidemia    Hypertension    Prostate cancer ([place])    Prostate cancer ([place])    Prostate cancer ([place])    Ruptured lumbar disc    Skin cancer    Past Surgical History:  Procedure Laterality Date   ATRIAL FIBRILLATION ABLATION N/A 01/06/2022   Procedure: ATRIAL FIBRILLATION ABLATION;  Surgeon: Constance Haw, MD;  Location: [facility];  Service: Cardiovascular;  Laterality: N/A;   BACK SURGERY     CARDIOVERSION N/A 12/01/2021   Procedure: CARDIOVERSION;   Surgeon: Geralynn Rile, MD;  Location: [facility];  Service: Cardiovascular;  Laterality: N/A;   cataract Bilateral    COLONOSCOPY  10/09/2016   POLYPECTOMY     radioactive      radioactive seed implants for prostate CA.      Current Outpatient Medications  Medication Sig Dispense Refill   ELIQUIS 5 MG TABS tablet TAKE 1 TABLET(5 MG) BY MOUTH TWICE DAILY 60 tablet 11   Fish Oil-Cholecalciferol (OMEGA-3 FISH OIL/VITAMIN D3 PO) Take 3 tablets by mouth every morning.     metoprolol tartrate (LOPRESSOR) 25 MG tablet Take 1 tablet (25 mg total) by mouth 2 (two) times daily. 180 tablet 1   Multiple Vitamins-Minerals (OCUVITE PO) Take 1 tablet by mouth at bedtime.     pantoprazole (PROTONIX) 20 MG tablet Take 20 mg by mouth daily.     potassium chloride (KLOR-CON) 10 MEQ tablet Take 2 tablets (20 mEq total) by mouth daily. 60 tablet 6   rosuvastatin (CRESTOR) 20 MG tablet Take 1 tablet (20 mg total) by mouth daily. 90 tablet 3   sacubitril-valsartan (ENTRESTO) 24-26 MG Take 1 tablet by mouth 2 (two) times daily. 60 tablet 9   triamterene-hydrochlorothiazide (MAXZIDE-25) 37.5-25 MG tablet Take 1 tablet by mouth daily.     No current facility-administered medications for this visit.    Allergies:   Patient has no known allergies.   Social History:  The patient  reports that he has quit smoking. He has never used smokeless tobacco. He  reports that he does not drink alcohol and does not use drugs.   Family History:  The patient's family history includes Breast cancer in his sister.   ROS:  Please see the history of present illness.   Otherwise, review of systems is positive for none.   All other systems are reviewed and negative.   PHYSICAL EXAM: VS:  BP 112/74   Pulse 64   Ht 6' (1.829 m)   Wt 211 lb 3.2 oz (95.8 kg)   SpO2 96%   BMI 28.64 kg/m  , BMI Body mass index is 28.64 kg/m. GEN: Well nourished, well developed, in no acute distress  HEENT: normal  Neck: no JVD,  carotid bruits, or masses Cardiac: RRR; no murmurs, rubs, or gallops,no edema  Respiratory:  clear to auscultation bilaterally, normal work of breathing GI: soft, nontender, nondistended, + BS MS: no deformity or atrophy  Skin: warm and dry Neuro:  Strength and sensation are intact Psych: euthymic mood, full affect  EKG:  EKG is ordered today. Personal review of the ekg ordered shows sinus rhythm   Recent Labs: 12/29/2021: BUN 33; Creatinine, Ser 1.47; Hemoglobin 16.5; Platelets 171; Potassium 4.6; Sodium 139    Lipid Panel     Component Value Date/Time   CHOL 142 05/30/2019 0750   TRIG 151 (H) 05/30/2019 0750   HDL 39 (L) 05/30/2019 0750   CHOLHDL 3.6 05/30/2019 0750   LDLCALC 77 05/30/2019 0750     Wt Readings from Last 3 Encounters:  04/10/22 211 lb 3.2 oz (95.8 kg)  02/06/22 211 lb 12.8 oz (96.1 kg)  01/06/22 210 lb (95.3 kg)      Other studies Reviewed: Additional studies/ records that were reviewed today include: TTE 12/06/21  Review of the above records today demonstrates:   1. Left ventricular ejection fraction, by estimation, is 50 to 55%. The  left ventricle has low normal function. The left ventricle has no regional  wall motion abnormalities. Left ventricular diastolic parameters are  consistent with Grade I diastolic  dysfunction (impaired relaxation).   2. Right ventricular systolic function is normal. The right ventricular  size is normal.   3. The mitral valve is normal in structure. Mild mitral valve  regurgitation.   4. The aortic valve is tricuspid. There is mild calcification of the  aortic valve. There is moderate thickening of the aortic valve. Aortic  valve regurgitation is mild. Aortic valve sclerosis/calcification is  present, without any evidence of aortic  stenosis.   5. Aortic dilatation noted. There is mild dilatation of the aortic root,  measuring 40 mm. There is mild dilatation of the ascending aorta,  measuring 43 mm.   6. The  inferior vena cava is normal in size with greater than 50%  respiratory variability, suggesting right atrial pressure of 3 mmHg.   Coronary CTA 05/20/2021 1. Coronary calcium score of 972 (LM 101, LAD 712, LCX 82, RCA 78). This was 31 percentile for age and sex matched control.   2. Normal coronary origin with right dominance.   3. Multivessel CAD with moderate to severe mid LAD stenosis, moderate mid circumflex. Sending for FFR.   4.  Aortic atherosclerosis.  5.  CT FFR analysis didn't show any significant stenosis.  (LAD).  ASSESSMENT AND PLAN:  1.  Persistent atrial fibrillation: CHA2DS2-VASc of 4.  Currently on Eliquis 5 mg twice daily, metoprolol 25 mg daily.  Status post ablation 01/06/2022.  He is remained in normal rhythm.  We Michalene Debruler continue with  current management.  2.  Secondary hypercoagulable state: Currently on Eliquis for atrial fibrillation as above  3.  Coronary artery disease: Coronary CTA with moderate diffuse disease and borderline FFR.  No current chest pain.  Continue with current management.  4.  Hypertension: Currently well controlled  5.  Hoarseness: Occurred post ablation.  Potentially a complication of anesthesia.  We Chong January have him follow-up with the ENT.  Current medicines are reviewed at length with the patient today.   The patient does not have concerns regarding his medicines.  The following changes were made today: None  Labs/ tests ordered today include:  Orders Placed This Encounter  Procedures   EKG 12-Lead     Disposition:   FU 6 months  Signed, Jerra Huckeby Meredith Leeds, MD  04/10/2022 11:21 AM     [facility] [address]  [place] [address] [phone] (office) [phone] (fax)

## 2022-04-10 NOTE — Patient Instructions (Addendum)
Medication Instructions:  Your physician recommends that you continue on your current medications as directed. Please refer to the Current Medication list given to you today.  *If you need a refill on your cardiac medications before your next appointment, please call your pharmacy*   Lab Work: None ordered If you have labs (blood work) drawn today and your tests are completely normal, you will receive your results only by: [facility] (if you have MyChart) OR A paper copy in the mail If you have any lab test that is abnormal or we need to change your treatment, we will call you to review the results.   Testing/Procedures: None ordered   Follow-Up: At [facility], you and your health needs are our priority.  As part of our continuing mission to provide you with exceptional heart care, we have created designated Provider Care Teams.  These Care Teams include your primary Cardiologist (physician) and Advanced Practice Providers (APPs -  Physician Assistants and Nurse Practitioners) who all work together to provide you with the care you need, when you need it.   You have been referred to ENT for your continued hoarseness after ablation   Your next appointment:   6 month(s)  The format for your next appointment:   In Person  Provider:   Afib clinic   Thank you for choosing [facility]!!   Trinidad Curet, RN [phone]  Other Instructions    Important Information About Sugar

## 2022-04-21 ENCOUNTER — Telehealth: Payer: Self-pay | Admitting: Cardiology

## 2022-04-21 DIAGNOSIS — R490 Dysphonia: Secondary | ICD-10-CM

## 2022-04-21 NOTE — Telephone Encounter (Signed)
Daughter called stating the patient was refer to ENT at last visit.  Daughter wanted to check which ENT doctor she was referred to.

## 2022-04-21 NOTE — Telephone Encounter (Signed)
Daughter called wanting to know if they could be referred Kevin Day on [address] (formerly know as [facility] ph# [phone].

## 2022-04-21 NOTE — Telephone Encounter (Signed)
Pt reports he already made appt, needs new referral. Referral placed.

## 2022-04-27 DIAGNOSIS — I10 Essential (primary) hypertension: Secondary | ICD-10-CM | POA: Diagnosis not present

## 2022-04-27 DIAGNOSIS — I4891 Unspecified atrial fibrillation: Secondary | ICD-10-CM | POA: Diagnosis not present

## 2022-04-27 DIAGNOSIS — R55 Syncope and collapse: Secondary | ICD-10-CM | POA: Diagnosis not present

## 2022-04-27 DIAGNOSIS — R221 Localized swelling, mass and lump, neck: Secondary | ICD-10-CM | POA: Diagnosis not present

## 2022-04-27 DIAGNOSIS — E785 Hyperlipidemia, unspecified: Secondary | ICD-10-CM | POA: Diagnosis not present

## 2022-04-27 DIAGNOSIS — R7989 Other specified abnormal findings of blood chemistry: Secondary | ICD-10-CM | POA: Diagnosis not present

## 2022-04-28 ENCOUNTER — Other Ambulatory Visit: Payer: Self-pay | Admitting: Registered Nurse

## 2022-04-28 DIAGNOSIS — R221 Localized swelling, mass and lump, neck: Secondary | ICD-10-CM

## 2022-04-28 DIAGNOSIS — R55 Syncope and collapse: Secondary | ICD-10-CM

## 2022-05-02 DIAGNOSIS — R490 Dysphonia: Secondary | ICD-10-CM | POA: Diagnosis not present

## 2022-05-02 DIAGNOSIS — J383 Other diseases of vocal cords: Secondary | ICD-10-CM | POA: Diagnosis not present

## 2022-05-07 ENCOUNTER — Other Ambulatory Visit (HOSPITAL_BASED_OUTPATIENT_CLINIC_OR_DEPARTMENT_OTHER): Payer: Self-pay | Admitting: Family

## 2022-05-08 NOTE — Telephone Encounter (Signed)
Rx(s) sent to pharmacy electronically.  

## 2022-05-12 ENCOUNTER — Ambulatory Visit: Payer: Medicare Other | Attending: Family Medicine | Admitting: Family Medicine

## 2022-05-12 ENCOUNTER — Encounter (HOSPITAL_BASED_OUTPATIENT_CLINIC_OR_DEPARTMENT_OTHER): Payer: Self-pay | Admitting: Family Medicine

## 2022-05-12 DIAGNOSIS — Z7184 Encounter for health counseling related to travel: Secondary | ICD-10-CM | POA: Diagnosis present

## 2022-05-12 MED ORDER — NIRMATRELVIR&RITONAVIR 300/100 20 X 150 MG & 10 X 100MG PO TBPK
ORAL_TABLET | ORAL | 0 refills | Status: AC
Start: 2022-05-12 — End: 2022-05-17

## 2022-05-12 NOTE — Progress Notes (Signed)
Clinic Note:    SUBJECTIVE:  Eric Kemp is a 81 year old male with the following active problem list:    Patient Active Problem List:     Melanoma of skin ([place])     Dyslipidemia     Calculus of kidney     Essential hypertension     Plantar fasciitis      CC:     1) travel  -to [place] for 3 wks soon  -has been on paxlovid in the past and that helped   -would like med to take with her if she gets it  -would also like for husband as well  -not on any meds that would affect it  -kidney function wnl    No past medical history on file.    No past surgical history on file.    amLODIPine (NORVASC) 10 MG tablet, Take 1 tablet by mouth in the morning., Disp: 90 tablet, Rfl: 3    No current facility-administered medications on file prior to visit.      Review of Patient's Allergies indicates:  No Known Allergies    Review of patient's family history indicates:  Problem: Heart      Relation: Mother          Age of Onset: 60          Comment: had bypass, dementia  Problem: Mental/Emotional Disorders      Relation: Mother          Age of Onset: (Not Specified)          Comment: dementia  Problem: Cancer - Ovarian      Relation: Mother          Age of Onset: 30          Comment: early diagnosis and removal  Problem: Heart      Relation: Father          Age of Onset: (Not Specified)  Problem: Stroke      Relation: Father          Age of Onset: (Not Specified)  Problem: Cancer - Colon      Relation: Father          Age of Onset: (Not Specified)          Comment: died from a blockage - 56s      Social History     Socioeconomic History    Marital status: Married     Spouse name: Not on file    Number of children: Not on file    Years of education: Not on file    Highest education level: Not on file   Occupational History    Not on file   Tobacco Use    Smoking status: Never    Smokeless tobacco: Never   Substance and Sexual Activity    Alcohol use: Yes    Drug use: Not on file    Sexual activity: Not on file   Other Topics Concern     Not on file   Social History Narrative    Not on file   Social Determinants of Health  Financial Resource Strain: Not on file  Food Insecurity: Not on file  Transportation Needs: Not on file  Physical Activity: Not on file  Stress: Not on file  Social Connections: Not on file  Intimate Partner Violence: Not on file  Housing Stability: Not on file    OBJECTIVE:  There were no vitals taken for this visit.  Physical Exam  Constitutional:       Appearance: Normal appearance.   Neurological:      Mental Status: He is alert.   Psychiatric:         Mood and Affect: Mood normal.         Behavior: Behavior normal.         Thought Content: Thought content normal.         Judgment: Judgment normal.       A/P:     (Z71.84) Travel advice encounter  Plan: nirmatrelvir-ritonavir (PAXLOVID) 300 & 100 mg         tablet therapy pack            1. The patient indicates understanding of these issues and agrees with the plan.  2.  The patient is given an After Visit Summary sheet that lists all of their medications with directions, their allergies, orders placed during this encounter, immunization dates, and follow- up instructions.  3. I reviewed the patient's medical information and medical history   4.  I reconciled the patient's medication list and prepared and supplied needed refills.  5.  I have reviewed the past medical, family, and social history sections including the medications and allergies listed in the above medical record    Marquis Lunch, PA-C, 05/12/2022

## 2022-05-24 DIAGNOSIS — X32XXXD Exposure to sunlight, subsequent encounter: Secondary | ICD-10-CM | POA: Diagnosis not present

## 2022-05-24 DIAGNOSIS — D225 Melanocytic nevi of trunk: Secondary | ICD-10-CM | POA: Diagnosis not present

## 2022-05-24 DIAGNOSIS — L570 Actinic keratosis: Secondary | ICD-10-CM | POA: Diagnosis not present

## 2022-05-25 ENCOUNTER — Ambulatory Visit
Admission: RE | Admit: 2022-05-25 | Discharge: 2022-05-25 | Disposition: A | Discharge status: Home / Self Care | Payer: PPO | Source: Ambulatory Visit | Attending: Registered Nurse | Admitting: Registered Nurse

## 2022-05-25 DIAGNOSIS — I671 Cerebral aneurysm, nonruptured: Secondary | ICD-10-CM | POA: Diagnosis not present

## 2022-05-25 DIAGNOSIS — D170 Benign lipomatous neoplasm of skin and subcutaneous tissue of head, face and neck: Secondary | ICD-10-CM | POA: Diagnosis not present

## 2022-05-25 DIAGNOSIS — R55 Syncope and collapse: Secondary | ICD-10-CM

## 2022-05-25 DIAGNOSIS — R221 Localized swelling, mass and lump, neck: Secondary | ICD-10-CM

## 2022-05-25 DIAGNOSIS — R42 Dizziness and giddiness: Secondary | ICD-10-CM | POA: Diagnosis not present

## 2022-05-25 DIAGNOSIS — I6601 Occlusion and stenosis of right middle cerebral artery: Secondary | ICD-10-CM | POA: Diagnosis not present

## 2022-05-25 MED ORDER — IOPAMIDOL (ISOVUE-370) INJECTION 76%
75.0000 mL | Freq: Once | INTRAVENOUS | Status: AC | PRN
  Administered 2022-05-25: 75 mL via INTRAVENOUS

## 2022-06-02 ENCOUNTER — Other Ambulatory Visit: Payer: Self-pay | Admitting: Internal Medicine

## 2022-06-02 DIAGNOSIS — G9389 Other specified disorders of brain: Secondary | ICD-10-CM

## 2022-06-05 ENCOUNTER — Encounter (HOSPITAL_BASED_OUTPATIENT_CLINIC_OR_DEPARTMENT_OTHER): Payer: Self-pay | Admitting: Family Medicine

## 2022-06-06 ENCOUNTER — Ambulatory Visit
Admission: RE | Admit: 2022-06-06 | Discharge: 2022-06-06 | Disposition: A | Discharge status: Home / Self Care | Payer: PPO | Source: Ambulatory Visit | Attending: Internal Medicine | Admitting: Internal Medicine

## 2022-06-06 DIAGNOSIS — G319 Degenerative disease of nervous system, unspecified: Secondary | ICD-10-CM | POA: Diagnosis not present

## 2022-06-06 DIAGNOSIS — G9389 Other specified disorders of brain: Secondary | ICD-10-CM

## 2022-06-06 DIAGNOSIS — G939 Disorder of brain, unspecified: Secondary | ICD-10-CM | POA: Diagnosis not present

## 2022-06-06 DIAGNOSIS — Q283 Other malformations of cerebral vessels: Secondary | ICD-10-CM | POA: Diagnosis not present

## 2022-06-06 DIAGNOSIS — I6782 Cerebral ischemia: Secondary | ICD-10-CM | POA: Diagnosis not present

## 2022-06-06 MED ORDER — GADOBUTROL 1 MMOL/ML IV SOLN
10.0000 mL | Freq: Once | INTRAVENOUS | Status: AC | PRN
  Administered 2022-06-06: 10 mL via INTRAVENOUS

## 2022-06-09 ENCOUNTER — Ambulatory Visit: Payer: Medicare Other | Attending: Family Medicine | Admitting: Family Medicine

## 2022-06-09 ENCOUNTER — Other Ambulatory Visit: Payer: Self-pay

## 2022-06-09 ENCOUNTER — Encounter (HOSPITAL_BASED_OUTPATIENT_CLINIC_OR_DEPARTMENT_OTHER): Payer: Self-pay | Admitting: Family Medicine

## 2022-06-09 VITALS — BP 137/68 | HR 61 | Temp 98.2°F | Ht 73.62 in | Wt 182.0 lb

## 2022-06-09 DIAGNOSIS — Z1211 Encounter for screening for malignant neoplasm of colon: Secondary | ICD-10-CM | POA: Insufficient documentation

## 2022-06-09 DIAGNOSIS — S42002A Fracture of unspecified part of left clavicle, initial encounter for closed fracture: Secondary | ICD-10-CM | POA: Insufficient documentation

## 2022-06-09 NOTE — Progress Notes (Signed)
SUBJECTIVE    Eric Kemp's main goal for today's visit is to address clavicle fracture     #1)Clavicle fracture  -05/29/2022-Pt was hiking in [place] and fell down on steep incline- was in a remote area but immediately finished hike and went to ED. Also hit back of head, no LOC, no nausea, no dizziness. Had left shoulder pain immediately.   -Took x-rays at hospital and diagnosed with clavicle fracture. Language barrier so no further information about the fracture. Did put a brace on the area, no sling.  -Did initially have headaches but have resolved.   -Able to move without pain. Hasn't been lifting anything.  -Denies headaches, dizziness, nausea, blurred vision.    Health Maintenance  Review of his age appropriate health maintenance list reveals:  COLONOSCOPY due on 02/16/2022  TETANUS VACCINE(2 - Td or Tdap) due on 11/27/2022  HEALTH CARE PROXY due on 10/10/2022  AWQ Questionnaire due on 10/10/2022  FALL RISK ASSESSMENT: OVER 65 due on 06/10/2023  PHYSICAL EXAM Completed  ZOSTER VACCINE Completed  INFLUENZA VACCINE Completed  COVID-19 Vaccine Completed  PNEUMOCOCCAL VACCINE SERIES (65+) Completed.     OBJECTIVE    BP 137/68   Pulse 61   Temp 98.2 F (36.8 C) (Temporal)   Ht 6' 1.62" (1.87 m)   Wt 82.6 kg (182 lb)   SpO2 95%   BMI 23.61 kg/m   Pain Score: Data Unavailable        Most Recent BP Reading(s)  06/09/22 : 137/68  10/10/21 : (!) 141/72    Most Recent Weight Reading(s)  06/09/22 : 82.6 kg (182 lb)  10/10/21 : 87.1 kg (192 lb)  09/25/19 : 86.2 kg (190 lb)          Physical Exam  Constitutional:       Appearance: Normal appearance. He is normal weight.   HENT:      Head: Normocephalic. Abrasion (Posterior scalp, well healed) present. No Battle's sign.   Eyes:      Conjunctiva/sclera: Conjunctivae normal.   Cardiovascular:      Heart sounds: Normal heart sounds.   Pulmonary:      Effort: Pulmonary effort is normal.      Breath sounds: Normal breath sounds.   Musculoskeletal:      Left shoulder:  Tenderness and bony tenderness present. No swelling or deformity. Decreased range of motion (Unable to lift arm over head due to pain).      Comments: Wearing brace that crosses behind his back.   Skin:     General: Skin is warm and dry.      Findings: No rash.   Neurological:      General: No focal deficit present.      Mental Status: He is alert and oriented to person, place, and time. Mental status is at baseline.   Psychiatric:         Mood and Affect: Mood normal.         Behavior: Behavior normal.         Thought Content: Thought content normal.         Judgment: Judgment normal.         ASSESSMENT AND PLAN:  Dvante Hands is a 81 year old male with:    (S42.009A) Closed nondisplaced fracture of clavicle, left, unspecified part of clavicle, initial encounter  (primary encounter diagnosis)  Comment: Occurred 05/29/2022 in [place]. Pt has x-rays on CD but didn't bring today. Due to language barrier, pt didn't understand most  of what was told to him in ED. Wearing brace and largely without pain. Was told no sling needed.  Plan: REFERRAL TO ORTHOPEDICS (INT)        Referred urgently to orthopedics. Discussed given lack of information about where fractures is. etc, difficult to give recommendations. Continue to wear brace. Can use as tolerated but discouraged lifting anything until he sees ortho. NSAIDS or tylenol for pain.     (Z12.11) Screen for colon cancer  Comment: q3 years due to polyps.  Plan: COLONOSCOPY        Follow results.    Tina Griffiths, PA-C, 06/09/2022

## 2022-06-12 ENCOUNTER — Encounter (HOSPITAL_BASED_OUTPATIENT_CLINIC_OR_DEPARTMENT_OTHER): Payer: Self-pay

## 2022-06-12 NOTE — Progress Notes (Signed)
RN ASSESSMENT COMPLETED

## 2022-06-13 ENCOUNTER — Other Ambulatory Visit (HOSPITAL_BASED_OUTPATIENT_CLINIC_OR_DEPARTMENT_OTHER): Payer: Self-pay | Admitting: Orthopaedic Surgery

## 2022-06-13 DIAGNOSIS — R52 Pain, unspecified: Secondary | ICD-10-CM

## 2022-06-14 ENCOUNTER — Ambulatory Visit (HOSPITAL_BASED_OUTPATIENT_CLINIC_OR_DEPARTMENT_OTHER): Payer: Medicare Other | Admitting: Orthopaedic Surgery

## 2022-06-14 ENCOUNTER — Ambulatory Visit
Admission: RE | Admit: 2022-06-14 | Discharge: 2022-06-14 | Disposition: A | Discharge status: Home / Self Care | Payer: Medicare Other | Attending: Diagnostic Radiology | Admitting: Diagnostic Radiology

## 2022-06-14 ENCOUNTER — Encounter (HOSPITAL_BASED_OUTPATIENT_CLINIC_OR_DEPARTMENT_OTHER): Payer: Self-pay | Admitting: Orthopaedic Surgery

## 2022-06-14 ENCOUNTER — Other Ambulatory Visit: Payer: Self-pay

## 2022-06-14 ENCOUNTER — Ambulatory Visit (HOSPITAL_BASED_OUTPATIENT_CLINIC_OR_DEPARTMENT_OTHER): Admit: 2022-06-14 | Payer: Medicare Other

## 2022-06-14 DIAGNOSIS — S42035A Nondisplaced fracture of lateral end of left clavicle, initial encounter for closed fracture: Secondary | ICD-10-CM | POA: Diagnosis present

## 2022-06-14 DIAGNOSIS — S42032A Displaced fracture of lateral end of left clavicle, initial encounter for closed fracture: Secondary | ICD-10-CM | POA: Insufficient documentation

## 2022-06-14 DIAGNOSIS — R52 Pain, unspecified: Secondary | ICD-10-CM | POA: Diagnosis not present

## 2022-06-14 NOTE — Progress Notes (Signed)
Date of service:  06/14/2022    I have personally seen and examined the patient.    This is a 81 year old year old male who sustained a fall 2 and half weeks ago while hiking in [place].  He fell in the left shoulder and was seen locally there with x-rays demonstrating a clavicle fracture.  He was provided a figure-of-eight brace.  He does localize some pain over the superior aspect of the shoulder but it is very mild at this point.  He denies numbness or tingling.    Physical examination:   General - the patient is alert and oriented x3, well appearing, in no acute distress.    Left shoulder there is prominence of the distal clavicle with some swelling.  Minimal tenderness.  Elevation 150.  Strength 5/5.  Neurovascular intact    Radiographs: Left clavicle films  personally reviewed demonstrate there is a mildly displaced distal clavicle fracture with some mild elevation in the coracoclavicular distance.  This is unchanged from imaging on a CD from the date of injury the patient brought with him today.    Assessment: Left distal clavicle fracture, date of injury 05/29/22    Plan: I reviewed the findings with the patient.  I reassured him this fracture may be managed conservatively and reviewed the typical recovery time.  He was provided a sling to use instead of the figure 8 brace and use the arm for light activities below shoulder level.  He was advised avoid heavy lifting, pushing and pulling.  He will follow-up in a month with x-rays of the clavicle.    I agree with the remainder of the plan per the PA and personally spent the substantive portion of the encounter providing care for the patient.         This note was prepared using voice recognition software.  Please disregard any transcription errors.      05/29/22

## 2022-06-14 NOTE — Progress Notes (Signed)
Patient was fitted with left arm large sling. Given by Sabino Dick, under the direction of Dr. Jalene Mullet. Instructions were provided and patient expressed understanding. Patient was made aware that they may receive a bill for the sling if their insurance does not cover it.

## 2022-06-14 NOTE — Progress Notes (Signed)
New Orthopedic Consult   Date of service: 06/14/2022    Chief complaint:    Left clavicle fracture  Date of injury/onset:    05/29/2022    HPI:   This is a 81 year old year old  male presenting today for evaluation of left clavicle fracture that started on 05/29/2022 when the patient was hiking down a mountain in [place].  He fell onto his left shoulder.  He was seen in [place] for this problem.  They advised him not to do any heavy lifting and gave him a figure-of-eight brace to wear.  He states this is somewhat uncomfortable.  He states his symptoms of pain have improved greatly since the initial injury 2 and half weeks ago.  He has somewhat painful range of motion when trying to lift overhead.  He denies any paresthesias.    Occupation: Retired  Smoking: Denies    PMHx:  No past medical history on file.    Allergies   Review of Patient's Allergies indicates:   Sulfa antibiotics       Hives, Rash    Medications:       Current Outpatient Medications:     amLODIPine (NORVASC) 10 MG tablet, Take 1 tablet by mouth in the morning., Disp: 90 tablet, Rfl: 3    Surgical Hx: No past surgical history on file.    Physical exam:  General/neuro: Alert and oriented, no acute distress, cooperative  HEENT: Normocephalic, atraumatic  Neck: no noticeable or palpable swelling, redness or rash around throat or on face  Pulm: breathing comfortably on room air  Skin: warm and dry without rashes, lesions or abrasions  Musculoskeletal:   Upon expection of the left clavicle there is some ecchymosis over the chest.  There is a small deformity over the distal clavicle which is mildly tender to palpation.  No significant tenderness to the rest of the clavicle or shoulder.  Range of motion is intact to about 90 degrees of forward flexion and AB duction without any gross weakness.  External rotation causes some discomfort passively.  Internal rotation intact.  Neurovascular intact distally with normal motor exam and sensory exam.  2+ radial looks  pulse.    Imaging: X-rays of the left shoulder were personally reviewed by myself and Dr. Jalene Mullet.  There is a mildly displaced lateral clavicle fracture.  This is somewhat anteriorly displaced.  There is some mild degenerative changes of the glenohumeral joint noted as well.    Assessment/plan:  81 year old male with left clavicle fracture mildly displaced.  We recommend conservative treatment for this problem.  We discussed gentle range of motion of the hand, wrist, elbow and avoiding over the head reaching with the left arm.  We discussed no lifting on the left side greater than 5 pounds.  We gave him a sling instead of the figure-of-eight brace as this will be more comfortable for him.  We discussed keeping the left glenohumeral joint motion with internal extra rotation intact and I showed him some exercises he can do to help prevent stiffness as he does have some underlying arthritis there.  The patient will follow-up with [place] in 1 month for reevaluation and repeat x-rays on arrival of the left clavicle.    The patient agrees with this plan and all the patient's questions were answered .       Patient was seen, discussed and examined with Dr. Jalene Mullet. Please see his note for further details.       Clotilde Dieter, PA-C,  06/14/2022      This note was prepared using voice recognition software. Please disregard any transcription errors.

## 2022-06-16 DIAGNOSIS — I700 Atherosclerosis of aorta: Secondary | ICD-10-CM | POA: Diagnosis not present

## 2022-06-16 DIAGNOSIS — I4891 Unspecified atrial fibrillation: Secondary | ICD-10-CM | POA: Diagnosis not present

## 2022-06-16 DIAGNOSIS — J439 Emphysema, unspecified: Secondary | ICD-10-CM | POA: Diagnosis not present

## 2022-06-16 DIAGNOSIS — I712 Thoracic aortic aneurysm, without rupture, unspecified: Secondary | ICD-10-CM | POA: Diagnosis not present

## 2022-06-16 DIAGNOSIS — E785 Hyperlipidemia, unspecified: Secondary | ICD-10-CM | POA: Diagnosis not present

## 2022-06-16 DIAGNOSIS — D6869 Other thrombophilia: Secondary | ICD-10-CM | POA: Diagnosis not present

## 2022-06-16 DIAGNOSIS — I25118 Atherosclerotic heart disease of native coronary artery with other forms of angina pectoris: Secondary | ICD-10-CM | POA: Diagnosis not present

## 2022-06-16 DIAGNOSIS — I10 Essential (primary) hypertension: Secondary | ICD-10-CM | POA: Diagnosis not present

## 2022-06-16 DIAGNOSIS — E876 Hypokalemia: Secondary | ICD-10-CM | POA: Diagnosis not present

## 2022-06-16 DIAGNOSIS — D170 Benign lipomatous neoplasm of skin and subcutaneous tissue of head, face and neck: Secondary | ICD-10-CM | POA: Diagnosis not present

## 2022-06-16 DIAGNOSIS — M170 Bilateral primary osteoarthritis of knee: Secondary | ICD-10-CM | POA: Diagnosis not present

## 2022-06-20 ENCOUNTER — Emergency Department (HOSPITAL_BASED_OUTPATIENT_CLINIC_OR_DEPARTMENT_OTHER): Payer: Medicare Other

## 2022-06-20 ENCOUNTER — Emergency Department
Admission: AD | Admit: 2022-06-20 | Discharge: 2022-06-20 | Disposition: A | Discharge status: Home / Self Care | Payer: Medicare Other | Source: Ambulatory Visit | Attending: Emergency Medicine | Admitting: Emergency Medicine

## 2022-06-20 ENCOUNTER — Other Ambulatory Visit: Payer: Self-pay

## 2022-06-20 ENCOUNTER — Encounter (HOSPITAL_BASED_OUTPATIENT_CLINIC_OR_DEPARTMENT_OTHER): Payer: Self-pay

## 2022-06-20 DIAGNOSIS — R059 Cough, unspecified: Secondary | ICD-10-CM | POA: Insufficient documentation

## 2022-06-20 DIAGNOSIS — R052 Subacute cough: Secondary | ICD-10-CM | POA: Insufficient documentation

## 2022-06-20 DIAGNOSIS — S42032D Displaced fracture of lateral end of left clavicle, subsequent encounter for fracture with routine healing: Secondary | ICD-10-CM | POA: Diagnosis present

## 2022-06-20 MED ORDER — BENZONATATE 100 MG PO CAPS
100.00 mg | ORAL_CAPSULE | Freq: Three times a day (TID) | ORAL | 0 refills | Status: AC | PRN
Start: 2022-06-20 — End: 2022-06-27

## 2022-06-20 MED ORDER — ALBUTEROL SULFATE HFA 108 (90 BASE) MCG/ACT IN AERS
2.00 | INHALATION_SPRAY | Freq: Four times a day (QID) | RESPIRATORY_TRACT | 0 refills | Status: AC | PRN
Start: 2022-06-20 — End: 2022-07-04

## 2022-06-20 NOTE — Narrator Note (Signed)
Patient Disposition  Patient education for diagnosis, medications, activity, diet and follow-up.  Patient left UC 11:24 AM.  Patient rep received written instructions.    Interpreter to provide instructions: No    Patient belongings with patient: YES    Have all existing LDAs been addressed? N/A    Have all IV infusions been stopped? N/A    Destination: Home

## 2022-06-20 NOTE — UC Provider Notes (Signed)
The patient was seen primarily by me. Urgent care nursing record was reviewed. Prior records as available electronically through the Epic record were reviewed.         HPI:    This is a 81 year old male patient complaining of cough for 3 weeks, mostly dry, had occasional productive phlegm with yellow appearance.  Remote history of asthma.  Also endorsing fall on 10/2 while in [place], sustaining left clavicle fracture, using sling and following with orthopedics.  Denies any fevers chills, nausea,, diarrhea, rhinorrhea, sore throat, ear pain.      ROS: Pertinent positives were reviewed as per the HPI above. All other systems were reviewed and are negative.      Past Medical History/Problem list:  History reviewed. No pertinent past medical history.  Patient Active Problem List:     Melanoma of skin ([place])     Dyslipidemia     Calculus of kidney     Essential hypertension     Plantar fasciitis        Past Surgical History: History reviewed. No pertinent surgical history.      Medications:   No current facility-administered medications for this encounter.     Current Outpatient Medications   Medication Sig    amLODIPine (NORVASC) 10 MG tablet Take 1 tablet by mouth in the morning.         Social History: Social History    Tobacco Use      Smoking status: Never      Smokeless tobacco: Never    Alcohol use: Yes        Allergies:  Review of Patient's Allergies indicates:   Sulfa antibiotics       Hives, Rash      Physical Exam:  ED Triage Vitals [06/20/22 1041]   ED Triage Vitals Brief Group      Temp 98.1 F      Pulse 62      Resp 18      BP 158/90      SpO2 94 %      Pain Score 0        GENERAL: No acute distress.   SKIN:  Warm & Dry, no rash.  HEAD: Atraumatic. PERRL. EOMI.  Oropharynx: clear.  NECK: No midline tenderness.  No LAN.   LUNGS: Mild wheezing bilateral  HEART:  RRR.  No murmurs, rubs, or gallops.   ABDOMEN:  Soft, NTND.  No guarding or rebound tenderness.   MUSCULOSKELETAL:  No obvious deformities.     NEUROLOGIC: Alert and oriented.  Moves all extremities well.  PSYCHIATRIC:  Appropriate for age, time of day, and situation        Urgent Care Course and Medical Decision-making:    The patient is a  81 year old male with past medical history as above presents with 2 to 3 weeks of cough, predominantly nonproductive.  Patient afebrile, saturating 94% on arrival, no distress.  Chest x-ray performed.  Chest x-ray unremarkable with exception of known left clavicle fracture.  My review in agreement with radiology report.  Patient stable for discharge with outpatient follow-up.  Likely viral illness with mild reactive airway versus bronchitis.       Additional verbal discharge instructions were provided including patients diagnosis and follow up plan, as well as reasons to return to the Urgent Care or Emergency Department, which were discussed in detail.  Patient is agreeable with this management.         Condition: Improved  and Stable    Disposition:  Discharged to home    Diagnosis/Diagnoses:  No diagnosis found.        Ivy Lynn  Attending Physician  [facility]  [phone]

## 2022-06-20 NOTE — UC Triage Notes (Signed)
Pt presents with productive cough x 2-3 weeks. Pt reports a fall in [place] with a left broken clavicle. VSS

## 2022-06-23 ENCOUNTER — Ambulatory Visit: Payer: Medicare Other | Attending: Family Medicine | Admitting: Physician Assistant

## 2022-06-23 ENCOUNTER — Other Ambulatory Visit: Payer: Self-pay

## 2022-06-23 ENCOUNTER — Encounter (HOSPITAL_BASED_OUTPATIENT_CLINIC_OR_DEPARTMENT_OTHER): Payer: Self-pay | Admitting: Physician Assistant

## 2022-06-23 VITALS — BP 130/70 | HR 89 | Temp 100.3°F | Ht 73.62 in

## 2022-06-23 DIAGNOSIS — E559 Vitamin D deficiency, unspecified: Secondary | ICD-10-CM | POA: Diagnosis present

## 2022-06-23 DIAGNOSIS — Z131 Encounter for screening for diabetes mellitus: Secondary | ICD-10-CM | POA: Insufficient documentation

## 2022-06-23 DIAGNOSIS — Z13220 Encounter for screening for lipoid disorders: Secondary | ICD-10-CM | POA: Diagnosis present

## 2022-06-23 DIAGNOSIS — E785 Hyperlipidemia, unspecified: Secondary | ICD-10-CM | POA: Insufficient documentation

## 2022-06-23 DIAGNOSIS — I10 Essential (primary) hypertension: Secondary | ICD-10-CM | POA: Insufficient documentation

## 2022-06-23 DIAGNOSIS — J189 Pneumonia, unspecified organism: Secondary | ICD-10-CM | POA: Insufficient documentation

## 2022-06-23 LAB — BASIC METABOLIC PANEL
ANION GAP: 13 mmol/L (ref 10–22)
BUN (UREA NITROGEN): 14 mg/dL (ref 7–18)
CALCIUM: 9.5 mg/dL (ref 8.5–10.5)
CARBON DIOXIDE: 26 mmol/L (ref 21–32)
CHLORIDE: 97 mmol/L — ABNORMAL LOW (ref 98–107)
CREATININE: 1.2 mg/dL (ref 0.7–1.2)
ESTIMATED GLOMERULAR FILT RATE: >60 mL/min (ref >60)
Glucose Random: 122 mg/dL (ref 74–160)
POTASSIUM: 3.9 mmol/L (ref 3.5–5.1)
SODIUM: 136 mmol/L (ref 136–145)

## 2022-06-23 LAB — LIPID PANEL
Cholesterol: 236 mg/dL (ref 0–239)
HIGH DENSITY LIPOPROTEIN: 92 mg/dL (ref 40–60)
LOW DENSITY LIPOPROTEIN DIRECT: 141 mg/dL (ref 0–189)
TRIGLYCERIDES: 94 mg/dL (ref 0–150)

## 2022-06-23 LAB — HEMOGLOBIN A1C
ESTIMATED AVERAGE GLUCOSE: 111 mg/dL (ref 74–160)
HEMOGLOBIN A1C: 5.5 % (ref 4.0–5.6)

## 2022-06-23 LAB — VITAMIN D,25 HYDROXY: VITAMIN D,25 HYDROXY: 69 ng/mL (ref 30.0–100.0)

## 2022-06-23 MED ORDER — AZITHROMYCIN 250 MG PO TABS
250.00 mg | ORAL_TABLET | ORAL | 0 refills | Status: AC
Start: 2022-06-23 — End: 2022-06-28

## 2022-06-23 MED ORDER — AMOXICILLIN-POT CLAVULANATE 875-125 MG PO TABS
1.00 | ORAL_TABLET | Freq: Two times a day (BID) | ORAL | 0 refills | Status: AC
Start: 2022-06-23 — End: 2022-06-30

## 2022-06-23 NOTE — Progress Notes (Signed)
06/23/2022  PATIENT INFO: Eric Kemp  PCP: Orie Fisherman, MD    SUBJECTIVE  CC: cough  HPI:   Patient Eric Kemp is a 81 year old with PMHx htn, dyslipidemia who presents for the following:    # cough  - seen in UC 06/20/22 for 3 weeks of cough, intermittently productive   - in UC, O2 sat 94% on arrival, in no acute distress   - CXR with no acute cardiopulmonary findings   - suspected viral illness, albuterol inhaler provided   - remote hx of asthma in childhood   - returned from travel 06/08/22, cough started 3 days prior, no chest pain, no hemoptysis     - has been using albuterol   - sx worse today   - denies dyspnea, chest pain, LE pain/swelling   - Temp 100.29F on arrival today   - productive cough, cough disrupting sleep   - taking OTC cough medication   - subjectively febrile   - tested for COVID 2 days ago, negative   - decreased appetite, though hydrating well    # dyslipidemia   # htn  - due for labs   - taking amlodipine '5mg'$  daily    Review of Systems: No fevers, chills, rash, HA, sore throat, chest pain, cough, SOB, back pain, abd pain, N/V/D, muscle aches, joint aches, numbness, weakness, anxiety, depression. Otherwise per HPI.     Patient Active Problem List:     Melanoma of skin ([place])     Dyslipidemia     Calculus of kidney     Essential hypertension     Plantar fasciitis      No past surgical history on file.    Social History    Tobacco Use      Smoking status: Never      Smokeless tobacco: Never    Alcohol use: Yes        Current Outpatient Medications:     amoxicillin-clavulanate (AUGMENTIN) 875-125 MG per tablet, Take 1 tablet by mouth in the morning and 1 tablet before bedtime. Do all this for 7 days., Disp: 14 tablet, Rfl: 0    azithromycin (ZITHROMAX) 250 MG tablet, Take 1 tablet by mouth See Admin Instructions Two tablets (500) mg on day 1, followed by 250 mg once daily for 4 days.  for 5 days, Disp: 6 tablet, Rfl: 0    benzonatate (TESSALON) 100 MG capsule, Take 1 capsule by mouth 3  (three) times daily as needed for Cough  for up to 7 days, Disp: 21 capsule, Rfl: 0    albuterol HFA 108 (90 Base) MCG/ACT inhaler, Inhale 2 puffs into the lungs every 6 (six) hours as needed for Wheezing  for up to 14 days, Disp: 1 each, Rfl: 0    amLODIPine (NORVASC) 10 MG tablet, Take 1 tablet by mouth in the morning., Disp: 90 tablet, Rfl: 3    Review of Patient's Allergies indicates:   Sulfa antibiotics       Hives, Rash    Physical Exam:  BP 130/70   Pulse 89   Temp 100.3 F (37.9 C) (Temporal)   Ht 6' 1.62" (1.87 m)   SpO2 (!) 87%   BMI 23.87 kg/m   General: Well-appearing, in NAD.  Skin: Warm, dry, no rashes or skin lesions.   HEENT: NCAT, EOMI, conjunctiva clear, hearing intact to voice, moist mucous membranes.   Neck: Full ROM of neck.   CV: RRR, no M/R/G.   Pulm: Not using  accessory muscles of respiration. No wheezing. +rhonchi in R upper lung field  Extrem: Full ROM of all extremities.   Neuro: Awake, alert, gait and balance normal.    Psych: Euthymic.     Assessment/Plan:       Eric Kemp is a 81 year old English-speaking male with history of htn, hyperlipidemia who presents for the following:      1. Community acquired pneumonia of right upper lobe of lung  Cough x3 weeks, sx began to improve, though worsened within the last 24 hours, now accompanied with fever. O2 sat on arrival 87% though pt denies chest pain, dyspnea. Repeat O2 89%. No LE pain, swelling. No hemoptysis. COVID negative at home. Rhonchi in R upper lung field appreciated. Plan to treat for CAP.   - amoxicillin-clavulanate (AUGMENTIN) 875-125 MG per tablet; Take 1 tablet by mouth in the morning and 1 tablet before bedtime. Do all this for 7 days.  Dispense: 14 tablet; Refill: 0  - azithromycin (ZITHROMAX) 250 MG tablet; Take 1 tablet by mouth See Admin Instructions Two tablets (500) mg on day 1, followed by 250 mg once daily for 4 days.  for 5 days  Dispense: 6 tablet; Refill: 0  - spacer provided in clinic, can continue albuterol    - hold on trial of additional inhalers given sx highly suggestive of CAP  - f/u via [facility] msg in 5 days  - ED precautions reviewed, in setting of dyspnea, chest pain, LE pain/swelling, hemoptysis, dehydration, etc    We discussed Augmentin and Azithromycin and the importance of medication compliance. The patient was ready to learn and no apparent learning barriers were identified. I explained the diagnosis and treatment plan, and the patient expressed understanding of the content. Possible side effects of the prescribed medication(s) were explained.  I attempted to answer any questions regarding the diagnosis and the proposed treatment. Strict return and emergent precautions discussed. Pt. iterates understanding and all questions answered.       2. Vitamin D deficiency  - VITAMIN D,25 HYDROXY    3. Screening cholesterol level  4. Dyslipidemia  - LIPID PANEL    5. Diabetes mellitus screening  - HEMOGLOBIN A1C    6. Essential hypertension  Bp at goal.  - BASIC METABOLIC PANEL        I spent a total of 35 minutes on this visit on the date of service (total time includes all activities performed on the date of service): preparing to see the patient and chart review, obtaining updated history, performing medically appropriate physical examination, ordering medications, counseling and education the patient, communicating with other health care professionals, documenting clinical information in the health record, and care coordination.    1. Return to clinic if symptoms don't improve or worsen. Discussed when to return to office vs seek emergent care.  2. The patient and/or guardian indicates understanding of these issues and agrees with the plan.   No apparent learning barriers were identified. I attempted to answer any questions regarding the diagnosis and the proposed treatment.  3.  The patient is given an After Visit Summary sheet that lists all of their medications with directions, their allergies, orders placed  during this encounter, and follow- up instructions.  4. I reviewed the patient's medical information, allergies and medications. I reviewed the potential side effects of any new medications prescribed and reconciled the current medication list, supplying refills if needed.    Delora Fuel, PA-C

## 2022-06-27 ENCOUNTER — Encounter (HOSPITAL_BASED_OUTPATIENT_CLINIC_OR_DEPARTMENT_OTHER): Payer: Self-pay | Admitting: Physician Assistant

## 2022-06-30 ENCOUNTER — Ambulatory Visit: Payer: Medicare Other | Admitting: Dermatology

## 2022-06-30 ENCOUNTER — Other Ambulatory Visit: Payer: Self-pay

## 2022-06-30 DIAGNOSIS — Z85820 Personal history of malignant melanoma of skin: Secondary | ICD-10-CM | POA: Diagnosis present

## 2022-06-30 DIAGNOSIS — L578 Other skin changes due to chronic exposure to nonionizing radiation: Secondary | ICD-10-CM | POA: Insufficient documentation

## 2022-06-30 DIAGNOSIS — D485 Neoplasm of uncertain behavior of skin: Secondary | ICD-10-CM | POA: Diagnosis present

## 2022-06-30 DIAGNOSIS — D229 Melanocytic nevi, unspecified: Secondary | ICD-10-CM | POA: Insufficient documentation

## 2022-06-30 DIAGNOSIS — I781 Nevus, non-neoplastic: Secondary | ICD-10-CM

## 2022-06-30 NOTE — Progress Notes (Signed)
[facility]  [address], [place],  [address]    Dermatology Clinic Note    CC: skin check.   ?  HPI: Eric Kemp is a 81 year old male     He reports a history of a melanoma on his back. Unclear depth but per notes by Dr. Ina Homes it seems it most likely an insitu on the R back in 2017.   ?  ROS: negative, no other skin complaints.  Feels well, no systemic complaints.   ?  Past Dermatologic hx:  ?  PMH:  Patient Active Problem List:     Melanoma of skin ([place])     Dyslipidemia     Calculus of kidney     Essential hypertension     Plantar fasciitis    ?  Meds:  ?     Medication List            Accurate as of June 30, 2022  1:02 PM. If you have any questions, ask your nurse or doctor.                CONTINUE taking these medications      albuterol HFA 108 (90 Base) MCG/ACT inhaler  Inhale 2 puffs into the lungs every 6 (six) hours as needed for Wheezing  for up to 14 days     amLODIPine 10 MG tablet  Commonly known as: NORVASC  Take 1 tablet by mouth in the morning.     amoxicillin-clavulanate 875-125 MG per tablet  Commonly known as: AUGMENTIN  Take 1 tablet by mouth in the morning and 1 tablet before bedtime. Do all this for 7 days.            ?  All:  Review of Patient's Allergies indicates:   Sulfa antibiotics       Hives, Rash?     SHx:  ?Social History    Tobacco Use      Smoking status: Never      Smokeless tobacco: Never    Alcohol use: Yes       FHx:  Hx of melanoma - , hx of NMSC -  ?  Physical exam/Assessment/Plan:  Well appearing pt in NAD  Mood and affect are wnl  A full skin examination was performed including scalp, head, eyes, ears, nose,  lips,  neck, chest, axillae, abdomen, back, buttocks, bilateral upper extremities, bilateral lower extremities, hands, feet, fingers, toes, fingernails, and toenails.   Skin type: II    # Benign nevi, scattered on trunk and extremities: benign appearing brown macules and papules, most <33m with no concerning features on exam or dermoscopy.   - Skin  self-examination reviewed; ABCD's discussed.  - Sun protection and avoidance reviewed, including proper use of broad-spectrum UVB/UVA sunscreens with SPF 30 or greater, re-application after swimming and q 3-4 hours emphasized.?  Monitor:   - L abd [id number] macule with reticular pattern, photo taken  - L wrist 8x5m27man macule with heiroglyphic pattern.   -- photos taken, recheck in 16mo74mo# Dermatoheliosis of sun-exposed areas. Benign tan macules with moth-eaten borders c/w solar lentigines present.   - Skin self-examination reviewed; ABCD's discussed.   ??- Sun protection and avoidance reviewed, including proper use of broad-spectrum UVB/UVA sunscreens with SPF 30 or greater, re-application after swimming and q 3-4 hours emphasized.    # History of melanoma. Unknown depth, R back 2017.   - Well healed scar, NER.   - No  cervical, axillary, supraclavicular, or inguinal lymphadenopathy appreciated on exam.   - ROS negative.   - Skin self-examination reviewed; ABCD's discussed.   ??- Sun protection and avoidance reviewed, including proper use of broad-spectrum UVB/UVA sunscreens with SPF 30 or greater, re-application after swimming and q 3-4 hours emphasized.    # Neoplasm of uncertain behavior   - L back pink papule with surrounding hypopigmentation, favor halo nevus  Shave biopsy:   SHAVE BIOPSY PROCEDURE NOTE  -informed consent obtained, time out performed  -lesion was anesthetized with Lidocaine 2% w/ epi  -shave biopsy performed in the following location(s): L back  -hemostasis achieved using Aluminum Chloride  -wound dressed using vaseline and a bandaid  -wound care instructions were given  -the specimen will be sent to pathology and the patient will be notified of the results    PATIENT/PROCEDURE VERIFICATION DOCUMENTATION    Correct patient: Yes  Correct procedure: Yes  Correct side, site, mark visible if applicable: Yes  Correct position: Yes  Special equipment/implant(s) present, if applicable:  NA    Time-out completed, documented by provider doing procedure or designated team member:  Kelvin Cellar, MD              RTC: 9moTBSE      ?  ?  ?  CHarolyn Rutherford MD  [facility]    Melanoma quality documentation:   Melanoma staging: unknown  Next screening skin examination scheduled: YES   EPIC reminder message sent: YES  Pt added to dermatology melanoma list: YES           CC: OOrie Fisherman MD  [id number]

## 2022-07-07 DIAGNOSIS — H401131 Primary open-angle glaucoma, bilateral, mild stage: Secondary | ICD-10-CM | POA: Diagnosis not present

## 2022-07-17 ENCOUNTER — Telehealth (HOSPITAL_BASED_OUTPATIENT_CLINIC_OR_DEPARTMENT_OTHER): Payer: Self-pay

## 2022-07-17 LAB — SURGICAL PATH SPECIMEN DERM CLINIC

## 2022-07-17 NOTE — Telephone Encounter (Signed)
Outreach to pt per Dr Yang to review results. Pt states understanding and agrees w/ plan. No further questions at this time.

## 2022-07-17 NOTE — Telephone Encounter (Signed)
-----   Message from Kelvin Cellar, MD sent at 07/17/2022 12:03 PM EST -----  Can you let the patient know the biopsy results were benign, showed a benign mole, no further management needed unless it grows back. Thanks!    Harolyn Rutherford, MD  [facility]

## 2022-07-18 ENCOUNTER — Ambulatory Visit (HOSPITAL_BASED_OUTPATIENT_CLINIC_OR_DEPARTMENT_OTHER): Payer: Medicare Other | Admitting: Orthopaedic Surgery

## 2022-07-18 ENCOUNTER — Other Ambulatory Visit: Payer: Self-pay

## 2022-07-18 ENCOUNTER — Ambulatory Visit
Admission: RE | Admit: 2022-07-18 | Discharge: 2022-07-18 | Disposition: A | Discharge status: Home / Self Care | Payer: Medicare Other | Attending: Diagnostic Radiology | Admitting: Diagnostic Radiology

## 2022-07-18 DIAGNOSIS — S42035A Nondisplaced fracture of lateral end of left clavicle, initial encounter for closed fracture: Secondary | ICD-10-CM | POA: Diagnosis not present

## 2022-07-18 DIAGNOSIS — S42032D Displaced fracture of lateral end of left clavicle, subsequent encounter for fracture with routine healing: Secondary | ICD-10-CM | POA: Diagnosis present

## 2022-07-18 DIAGNOSIS — S42035D Nondisplaced fracture of lateral end of left clavicle, subsequent encounter for fracture with routine healing: Secondary | ICD-10-CM

## 2022-07-18 NOTE — Progress Notes (Signed)
Date of service:  07/18/2022    I have personally seen and examined the patient.    This is a 81 year old year old male who returns for follow-up of his left clavicle fracture.  He reports his shoulder is feeling very well.  He does not have any significant pain but has some stiffness with overhead activity.  He has stopped using the sling.    Physical examination:   General - the patient is alert and oriented x3, well appearing, in no acute distress.    Left shoulder demonstrates prominence over the distal clavicle without tenderness.  Elevation 170, external rotation at the side is 50, internal rotation of the back is to L1.  Strength 5/5.  Neurovascular intact    Radiographs: Left clavicle films  personally reviewed demonstrate a distal clavicle fracture line is maintained with interval callus.  There remains mild increased coracoclavicular distance    Assessment: Left distal clavicle fracture, date of injury 05/29/22       Plan: I reviewed the findings with the patient.  I reassured the fracture is healing appropriate this time.  He may continue to progress light activities but I still recommended avoiding heavy overhead lifting and pressing for another month.  He may then resume activities as tolerated and follow-up as needed.    I agree with the remainder of the plan per the PA and personally spent the substantive portion of the encounter providing care for the patient.       This note was prepared using voice recognition software.  Please disregard any transcription errors.

## 2022-07-18 NOTE — Progress Notes (Signed)
Orthopedic Follow Up  Date of service: 07/18/2022    Chief complaint:    Left clavicle fracture  Date of injury/onset:    05/29/2022    HPI:   This is a 81 year old year old  male presenting today for follow up of a left clavicle fracture sustained on 05/29/2022 when the patient was hiking [place] a [place] in [place].  He fell onto his left shoulder. He was given a sling at his last appointment which he has weaned out of. No new injuries or traumas. Overall feeling better though feels some mild stiffness of the shoulder at the upper limits of motion. No numbness or tingling distally. Little to no pain. He is flying for Thanksgiving and wondering if he can lift luggage into the overhead compartment.      Physical exam:  General/neuro: Alert and oriented, no acute distress, cooperative  HEENT: Normocephalic, atraumatic  Neck: no noticeable or palpable swelling, redness or rash around throat or on face  Pulm: breathing comfortably on room air  Skin: warm and dry without rashes, lesions or abrasions  Musculoskeletal:   Upon expection of the left clavicle there is a small deformity over the distal clavicle which is NTTP.  No significant tenderness to the rest of the clavicle or shoulder.  ROM: FF 180, ABD 175, IR to L1, ER 65. 5/5 strength against resistance without deficit.  Neurovascular intact distally with normal motor exam and sensory exam.  2+ radial looks pulse.    Imaging: new x-rays of the left shoulder taken today were personally reviewed by myself and Dr. Jalene Mullet.  There is a mildly displaced lateral clavicle fracture with new interval bone callous formation.    Assessment/plan:  81 year old male with left clavicle fracture mildly displaced. He is overall doing well. XR show interval bone callous formation. He is NTTP with nearly full ROM without deficit at the shoulder. Discussed that overhead/pressing type motions with weight greater than 10 lbs will likely be uncomfortable at this time. He should hold off on  these activities for around another 4 weeks. He is cleared to resume all other ADLs as tolerated. If he continues to have symptoms he should return to see [place]. Otherwise follow up as needed.    The patient agrees with this plan and all the patient's questions were answered .       Patient was seen, discussed and examined with Dr. Jalene Mullet. Please see his note for further details.       Belinda Fisher, PA-C, 07/18/2022      This note was prepared using voice recognition software. Please disregard any transcription errors.

## 2022-07-24 ENCOUNTER — Other Ambulatory Visit (HOSPITAL_COMMUNITY): Payer: Self-pay | Admitting: *Deleted

## 2022-07-24 MED ORDER — POTASSIUM CHLORIDE ER 10 MEQ PO TBCR: 20.0000 meq | EXTENDED_RELEASE_TABLET | Freq: Every day | ORAL | 6 refills | Status: DC

## 2022-08-20 ENCOUNTER — Other Ambulatory Visit: Payer: Self-pay | Admitting: Cardiovascular Disease

## 2022-08-20 DIAGNOSIS — I480 Paroxysmal atrial fibrillation: Secondary | ICD-10-CM

## 2022-08-20 DIAGNOSIS — I428 Other cardiomyopathies: Secondary | ICD-10-CM

## 2022-09-10 ENCOUNTER — Encounter (HOSPITAL_BASED_OUTPATIENT_CLINIC_OR_DEPARTMENT_OTHER): Payer: Self-pay | Admitting: Family Medicine

## 2022-09-11 NOTE — Telephone Encounter (Addendum)
PER Patient (self), Eric Kemp is a 83 year old male has requested a refill of      -  ketoconazole 2% cream      Last Office Visit: 06/23/2022 with Wilhelmina Mcardle  Last Physical Exam: 10/10/2021     COLONOSCOPY due on 02/16/2022     Other Med Adult:  Most Recent BP Reading(s)  06/23/22 : 130/70        Cholesterol (mg/dL)   Date Value   06/23/2022 236     LOW DENSITY LIPOPROTEIN DIRECT (mg/dL)   Date Value   06/23/2022 141     HIGH DENSITY LIPOPROTEIN (mg/dL)   Date Value   06/23/2022 92     TRIGLYCERIDES (mg/dL)   Date Value   06/23/2022 94         No results found for: "TSHSC"      No results found for: "TSH"    HEMOGLOBIN A1C (%)   Date Value   06/23/2022 5.5       No results found for: "POCA1C"      No results found for: "INR"    SODIUM (mmol/L)   Date Value   06/23/2022 136       POTASSIUM (mmol/L)   Date Value   06/23/2022 3.9           CREATININE (mg/dL)   Date Value   06/23/2022 1.2        Documented patient preferred pharmacies:    [place], [place]  Phone: [phone] Fax: [phone]

## 2022-09-12 MED ORDER — KETOCONAZOLE 2 % EX CREA
TOPICAL_CREAM | Freq: Two times a day (BID) | CUTANEOUS | 2 refills | Status: DC
Start: 2022-09-12 — End: 2023-10-31

## 2022-09-13 DIAGNOSIS — M65332 Trigger finger, left middle finger: Secondary | ICD-10-CM | POA: Diagnosis not present

## 2022-09-13 DIAGNOSIS — M79645 Pain in left finger(s): Secondary | ICD-10-CM | POA: Diagnosis not present

## 2022-09-17 DIAGNOSIS — M79642 Pain in left hand: Secondary | ICD-10-CM | POA: Diagnosis not present

## 2022-09-22 ENCOUNTER — Other Ambulatory Visit (HOSPITAL_BASED_OUTPATIENT_CLINIC_OR_DEPARTMENT_OTHER): Payer: Self-pay | Admitting: Family Medicine

## 2022-09-22 DIAGNOSIS — I10 Essential (primary) hypertension: Secondary | ICD-10-CM

## 2022-10-04 ENCOUNTER — Ambulatory Visit (HOSPITAL_COMMUNITY)
Admission: RE | Admit: 2022-10-04 | Discharge: 2022-10-04 | Disposition: A | Discharge status: Home / Self Care | Payer: PPO | Source: Ambulatory Visit | Attending: Physician Assistant | Admitting: Physician Assistant

## 2022-10-04 VITALS — BP 136/60 | HR 58 | Ht 72.0 in | Wt 212.0 lb

## 2022-10-04 DIAGNOSIS — I10 Essential (primary) hypertension: Secondary | ICD-10-CM | POA: Diagnosis not present

## 2022-10-04 DIAGNOSIS — I2510 Atherosclerotic heart disease of native coronary artery without angina pectoris: Secondary | ICD-10-CM | POA: Diagnosis not present

## 2022-10-04 DIAGNOSIS — D6869 Other thrombophilia: Secondary | ICD-10-CM | POA: Insufficient documentation

## 2022-10-04 DIAGNOSIS — E785 Hyperlipidemia, unspecified: Secondary | ICD-10-CM | POA: Insufficient documentation

## 2022-10-04 DIAGNOSIS — I4819 Other persistent atrial fibrillation: Secondary | ICD-10-CM | POA: Insufficient documentation

## 2022-10-04 DIAGNOSIS — Z79899 Other long term (current) drug therapy: Secondary | ICD-10-CM | POA: Insufficient documentation

## 2022-10-04 DIAGNOSIS — Z7901 Long term (current) use of anticoagulants: Secondary | ICD-10-CM | POA: Insufficient documentation

## 2022-10-04 MED ORDER — APIXABAN 5 MG PO TABS: ORAL_TABLET | ORAL | 2 refills | Status: DC

## 2022-10-04 MED ORDER — METOPROLOL TARTRATE 25 MG PO TABS: 12.5000 mg | ORAL_TABLET | Freq: Every morning | ORAL | Status: DC

## 2022-10-04 MED ORDER — METOPROLOL TARTRATE 25 MG PO TABS: 12.5000 mg | ORAL_TABLET | Freq: Every morning | ORAL | 2 refills | Status: DC

## 2022-10-04 NOTE — Progress Notes (Signed)
Primary Care Physician: Sueanne Margarita, DO Primary Cardiologist: Dr Burt Knack Primary Electrophysiologist: Dr Curt Bears  Referring Physician: [facility]   Kevin Day is a 82 y.o. male with a history of HLD, aortic atherosclerosis, dilated aortic root, HTN, atrial fibrillation who presents for follow up in the [facility].  The patient was initially diagnosed with atrial fibrillation 04/01/21 after presenting to the ED with symptoms of chest discomfort and SOB. ECG showed rate controlled afib. His K+ was 2.9. he was started on Eliquis for a CHADS2VASC score of 4, metoprolol, and KCL. Patient noticed by his apple watch that he went into afib Monday 11/14/21. He contacted Dr. Antionette Char office on that day and metoprolol was increased to 25 mg bid. He said if not for his apple watch, he would not have paid much attention to his heart rhythm. Afib  has occurred on the tail end  of 2 steroid tapers for neck pain, which is much better. Patient is s/p DCCV 12/01/21. He noted on his smart watch that he was back in afib on 12/13/21.   Patient is s/p afib ablation with Dr Curt Bears on 01/06/22.   On follow up today, patient reports that he has done well since his last visit. He has not had any known episodes of afib. He denies any bleeding issues on anticoagulation. His BB was decreased by his PCP for low BP readings with some dizziness. This has resolved.   Today, he denies symptoms of palpitations, shortness of breath, orthopnea, PND, lower extremity edema, presyncope, syncope, snoring, daytime somnolence, bleeding, or neurologic sequela. The patient is tolerating medications without difficulties and is otherwise without complaint today.    Atrial Fibrillation Risk Factors:  he does not have symptoms or diagnosis of sleep apnea. he does not have a history of rheumatic fever. he does not have a history of alcohol use.   he has a BMI of Body mass index is 28.75 kg/m.Marland Kitchen Filed  Weights   10/04/22 0843  Weight: 96.2 kg    Family History  Problem Relation Age of Onset   Breast cancer Sister    Colon cancer Neg Hx    Esophageal cancer Neg Hx    Rectal cancer Neg Hx    Stomach cancer Neg Hx    Colon polyps Neg Hx      Atrial Fibrillation Management history:  Previous antiarrhythmic drugs: none Previous cardioversions: 12/01/21 Previous ablations: 01/06/22 CHADS2VASC score: 4 Anticoagulation history: Eliquis   Past Medical History:  Diagnosis Date   Arthritis    Cataracts, bilateral    Hyperlipidemia    Hypertension    Prostate cancer ([place])    Prostate cancer ([place])    Prostate cancer ([place])    Ruptured lumbar disc    Skin cancer    Past Surgical History:  Procedure Laterality Date   ATRIAL FIBRILLATION ABLATION N/A 01/06/2022   Procedure: ATRIAL FIBRILLATION ABLATION;  Surgeon: Constance Haw, MD;  Location: [facility];  Service: Cardiovascular;  Laterality: N/A;   BACK SURGERY     CARDIOVERSION N/A 12/01/2021   Procedure: CARDIOVERSION;  Surgeon: Geralynn Rile, MD;  Location: [facility];  Service: Cardiovascular;  Laterality: N/A;   cataract Bilateral    COLONOSCOPY  10/09/2016   POLYPECTOMY     radioactive      radioactive seed implants for prostate CA.     Current Outpatient Medications  Medication Sig Dispense Refill   ENTRESTO 24-26 MG TAKE 1 TABLET  BY MOUTH TWICE DAILY 60 tablet 9   Fish Oil-Cholecalciferol (OMEGA-3 FISH OIL/VITAMIN D3 PO) Take 3 tablets by mouth every morning.     Multiple Vitamins-Minerals (OCUVITE PO) Take 1 tablet by mouth at bedtime.     pantoprazole (PROTONIX) 20 MG tablet Take 20 mg by mouth as needed.     potassium chloride (KLOR-CON) 10 MEQ tablet Take 2 tablets (20 mEq total) by mouth daily. 60 tablet 6   rosuvastatin (CRESTOR) 20 MG tablet TAKE 1 TABLET(20 MG) BY MOUTH DAILY 90 tablet 2   triamterene-hydrochlorothiazide (MAXZIDE-25) 37.5-25 MG tablet Take 1 tablet by mouth daily.      apixaban (ELIQUIS) 5 MG TABS tablet TAKE 1 TABLET(5 MG) BY MOUTH TWICE DAILY 180 tablet 2   metoprolol tartrate (LOPRESSOR) 25 MG tablet Take 0.5 tablets (12.5 mg total) by mouth every morning. 45 tablet 2   No current facility-administered medications for this encounter.    No Known Allergies  Social History   Socioeconomic History   Marital status: Married    Spouse name: Not on file   Number of children: Not on file   Years of education: Not on file   Highest education level: Not on file  Occupational History   Not on file  Tobacco Use   Smoking status: Former   Smokeless tobacco: Never   Tobacco comments:    Former smoker 05/10/2021  Vaping Use   Vaping Use: Never used  Substance and Sexual Activity   Alcohol use: No   Drug use: No   Sexual activity: Not on file  Other Topics Concern   Not on file  Social History Narrative   Not on file   Social Determinants of Health   Financial Resource Strain: Not on file  Food Insecurity: Not on file  Transportation Needs: Not on file  Physical Activity: Not on file  Stress: Not on file  Social Connections: Not on file  Intimate Partner Violence: Not on file     ROS- All systems are reviewed and negative except as per the HPI above.  Physical Exam: Vitals:   10/04/22 0843  BP: 136/60  Pulse: (!) 58  Weight: 96.2 kg  Height: 6' (1.829 m)     GEN- The patient is a well appearing elderly male, alert and oriented x 3 today.   HEENT-head normocephalic, atraumatic, sclera clear, conjunctiva pink, hearing intact, trachea midline. Lungs- Clear to ausculation bilaterally, normal work of breathing Heart- Regular rate and rhythm, no murmurs, rubs or gallops  GI- soft, NT, ND, + BS Extremities- no clubbing, cyanosis, or edema MS- no significant deformity or atrophy Skin- no rash or lesion Psych- euthymic mood, full affect Neuro- strength and sensation are intact   Wt Readings from Last 3 Encounters:  10/04/22 96.2 kg   04/10/22 95.8 kg  02/06/22 96.1 kg    EKG today demonstrates  SB Vent rate 58 PR interval 180 ms QRS 98 ms QTc 412 ms   Echo 03/20/21 demonstrated   1. The left ventricle has normal systolic function, with an ejection  fraction of 55-60%. The cavity size was normal. Left ventricular diastolic Doppler parameters are consistent with impaired relaxation.   2. The average left ventricular global longitudinal strain is normal at  -18.8 %.   3. The right ventricle has normal systolic function. The cavity was  normal. There is no increase in right ventricular wall thickness.   4. There is mild mitral annular calcification present.   5. The aortic valve  has an indeterminate number of cusps. Moderate  sclerosis of the aortic valve. Aortic valve regurgitation is mild by color  flow Doppler.   6. The aorta is abnormal in size and structure.   7. There is mild dilatation of the aortic root and of the ascending aorta  measuring 41 mm and 63m respectively.   Epic records are reviewed at length today  CHA2DS2-VASc Score = 4  The patient's score is based upon: CHF History: 0 HTN History: 1 Diabetes History: 0 Stroke History: 0 Vascular Disease History: 1 Age Score: 2 Gender Score: 0        ASSESSMENT AND PLAN: 1. Persistent atrial fibrillation  The patient's CHA2DS2-VASc score is 4, indicating a 4.8% annual risk of stroke.   S/p afib ablation 01/06/22 Patient appears to be maintaining SR. Continue Lopressor 25 mg BID Continue Eliquis 5 mg BID   2. Secondary Hypercoagulable State (ICD10:  D68.69) The patient is at significant risk for stroke/thromboembolism based upon his CHA2DS2-VASc Score of 4.  Continue Apixaban (Eliquis).   3. HTN Stable, no changes today.  4. CAD No anginal symptoms. On statin   Follow up in the AF clinic in 6 months and with Dr CCurt Bearsin one year.    [facility] Manor [address]GAjo Trimble  [phone]

## 2022-10-05 ENCOUNTER — Encounter (HOSPITAL_COMMUNITY): Payer: Self-pay | Admitting: *Deleted

## 2022-10-20 ENCOUNTER — Encounter (HOSPITAL_BASED_OUTPATIENT_CLINIC_OR_DEPARTMENT_OTHER): Payer: Self-pay | Admitting: Family Medicine

## 2022-10-20 DIAGNOSIS — I10 Essential (primary) hypertension: Secondary | ICD-10-CM

## 2022-10-21 MED ORDER — AMLODIPINE BESYLATE 10 MG PO TABS
10.0000 mg | ORAL_TABLET | Freq: Every day | ORAL | 3 refills | Status: DC
Start: 2022-10-21 — End: 2023-09-25

## 2022-10-21 NOTE — Telephone Encounter (Signed)
This prescription refill request has passed the Rx Renewal Authorization protocol.  This medication has been approved and sent to the patient's preferred pharmacy.      [facility], Eric Kemp is a 82 year old male has requested a refill of amlodipine.      Last Office Visit: 06/23/2022 with Wilhelmina Mcardle  Last Physical Exam: 10/10/2021    COLONOSCOPY due on 02/16/2022    Other Med Adult:  Most Recent BP Reading(s)  06/23/22 : 130/70        Cholesterol (mg/dL)   Date Value   06/23/2022 236     LOW DENSITY LIPOPROTEIN DIRECT (mg/dL)   Date Value   06/23/2022 141     HIGH DENSITY LIPOPROTEIN (mg/dL)   Date Value   06/23/2022 92     TRIGLYCERIDES (mg/dL)   Date Value   06/23/2022 94         No results found for: "TSHSC"      No results found for: "TSH"    HEMOGLOBIN A1C (%)   Date Value   06/23/2022 5.5       No results found for: "POCA1C"      No results found for: "INR"    SODIUM (mmol/L)   Date Value   06/23/2022 136       POTASSIUM (mmol/L)   Date Value   06/23/2022 3.9           CREATININE (mg/dL)   Date Value   06/23/2022 1.2       Documented patient preferred pharmacies:    [facility], [place]  Phone: [phone] Fax: [phone]

## 2022-10-23 ENCOUNTER — Other Ambulatory Visit (HOSPITAL_COMMUNITY): Payer: Self-pay | Admitting: *Deleted

## 2022-10-23 MED ORDER — METOPROLOL TARTRATE 25 MG PO TABS: 12.5000 mg | ORAL_TABLET | Freq: Every morning | ORAL | 2 refills | Status: DC

## 2022-10-28 DIAGNOSIS — Z23 Encounter for immunization: Secondary | ICD-10-CM | POA: Diagnosis not present

## 2022-11-03 ENCOUNTER — Other Ambulatory Visit (HOSPITAL_BASED_OUTPATIENT_CLINIC_OR_DEPARTMENT_OTHER): Payer: Self-pay

## 2022-11-03 ENCOUNTER — Encounter (HOSPITAL_BASED_OUTPATIENT_CLINIC_OR_DEPARTMENT_OTHER): Payer: Self-pay

## 2022-11-03 MED ORDER — SIMETHICONE 80 MG PO TABS
4.0000 | ORAL_TABLET | ORAL | 0 refills | Status: DC
Start: 2022-11-03 — End: 2022-11-23

## 2022-11-03 MED ORDER — PEG 3350-KCL-NABCB-NACL-NASULF 236 G PO SOLR
4.0000 L | ORAL | 0 refills | Status: DC
Start: 2022-11-03 — End: 2022-11-23

## 2022-11-03 NOTE — Telephone Encounter (Signed)
Called and spoke with patient, confirmed colonoscopy on 11/23/22.  Need for ride requirements reviewed.  Standard prep info sent in MyChart as requested, prep meds sent.

## 2022-11-10 DIAGNOSIS — H401132 Primary open-angle glaucoma, bilateral, moderate stage: Secondary | ICD-10-CM | POA: Diagnosis not present

## 2022-11-10 DIAGNOSIS — H35372 Puckering of macula, left eye: Secondary | ICD-10-CM | POA: Diagnosis not present

## 2022-11-13 DIAGNOSIS — J439 Emphysema, unspecified: Secondary | ICD-10-CM | POA: Diagnosis not present

## 2022-11-13 DIAGNOSIS — Z1152 Encounter for screening for COVID-19: Secondary | ICD-10-CM | POA: Diagnosis not present

## 2022-11-13 DIAGNOSIS — J029 Acute pharyngitis, unspecified: Secondary | ICD-10-CM | POA: Diagnosis not present

## 2022-11-13 DIAGNOSIS — J209 Acute bronchitis, unspecified: Secondary | ICD-10-CM | POA: Diagnosis not present

## 2022-11-13 DIAGNOSIS — R5383 Other fatigue: Secondary | ICD-10-CM | POA: Diagnosis not present

## 2022-11-13 DIAGNOSIS — J3489 Other specified disorders of nose and nasal sinuses: Secondary | ICD-10-CM | POA: Diagnosis not present

## 2022-11-13 DIAGNOSIS — R059 Cough, unspecified: Secondary | ICD-10-CM | POA: Diagnosis not present

## 2022-11-20 ENCOUNTER — Other Ambulatory Visit (HOSPITAL_COMMUNITY): Payer: Self-pay | Admitting: *Deleted

## 2022-11-20 MED ORDER — METOPROLOL TARTRATE 25 MG PO TABS: 25.0000 mg | ORAL_TABLET | Freq: Two times a day (BID) | ORAL | 2 refills | Status: DC

## 2022-11-21 ENCOUNTER — Other Ambulatory Visit (HOSPITAL_COMMUNITY): Payer: Self-pay | Admitting: *Deleted

## 2022-11-21 MED ORDER — METOPROLOL TARTRATE 25 MG PO TABS: 25.0000 mg | ORAL_TABLET | Freq: Two times a day (BID) | ORAL | 2 refills | Status: DC

## 2022-11-23 ENCOUNTER — Encounter (HOSPITAL_BASED_OUTPATIENT_CLINIC_OR_DEPARTMENT_OTHER): Payer: Self-pay | Admitting: Anesthesiology

## 2022-11-23 ENCOUNTER — Ambulatory Visit
Admission: RE | Admit: 2022-11-23 | Discharge: 2022-11-23 | Disposition: A | Discharge status: Home / Self Care | Payer: Medicare Other | Attending: Family Medicine | Admitting: Family Medicine

## 2022-11-23 ENCOUNTER — Ambulatory Visit (HOSPITAL_BASED_OUTPATIENT_CLINIC_OR_DEPARTMENT_OTHER): Payer: Self-pay | Admitting: Anesthesiology

## 2022-11-23 ENCOUNTER — Other Ambulatory Visit: Payer: Self-pay

## 2022-11-23 DIAGNOSIS — D124 Benign neoplasm of descending colon: Secondary | ICD-10-CM | POA: Insufficient documentation

## 2022-11-23 DIAGNOSIS — E785 Hyperlipidemia, unspecified: Secondary | ICD-10-CM | POA: Insufficient documentation

## 2022-11-23 DIAGNOSIS — I10 Essential (primary) hypertension: Secondary | ICD-10-CM | POA: Diagnosis not present

## 2022-11-23 DIAGNOSIS — Z800 Family history of malignant neoplasm of digestive organs: Secondary | ICD-10-CM | POA: Insufficient documentation

## 2022-11-23 DIAGNOSIS — K648 Other hemorrhoids: Secondary | ICD-10-CM

## 2022-11-23 DIAGNOSIS — K640 First degree hemorrhoids: Secondary | ICD-10-CM | POA: Insufficient documentation

## 2022-11-23 DIAGNOSIS — Z1211 Encounter for screening for malignant neoplasm of colon: Secondary | ICD-10-CM | POA: Insufficient documentation

## 2022-11-23 DIAGNOSIS — Z09 Encounter for follow-up examination after completed treatment for conditions other than malignant neoplasm: Secondary | ICD-10-CM | POA: Diagnosis not present

## 2022-11-23 DIAGNOSIS — Z86010 Personal history of colonic polyps: Secondary | ICD-10-CM | POA: Insufficient documentation

## 2022-11-23 MED ORDER — LIDOCAINE HCL (PF) 2 % IJ SOLN
Freq: Once | INTRAMUSCULAR | Status: DC | PRN
Start: 2022-11-23 — End: 2022-11-23
  Administered 2022-11-23: 80 mg via INTRAVENOUS

## 2022-11-23 MED ORDER — LIDOCAINE HCL (PF) 2 % IJ SOLN
INTRAMUSCULAR | Status: AC
Start: 2022-11-23 — End: 2022-11-23
  Filled 2022-11-23: qty 5

## 2022-11-23 MED ORDER — PROPOFOL INFUSION
INTRAVENOUS | Status: AC
Start: 2022-11-23 — End: 2022-11-23
  Filled 2022-11-23: qty 50

## 2022-11-23 MED ORDER — LACTATED RINGERS IV SOLN
INTRAVENOUS | Status: DC
Start: 2022-11-23 — End: 2022-11-24
  Administered 2022-11-23: 200 mL via INTRAVENOUS

## 2022-11-23 MED ORDER — PROPOFOL 200 MG/20 ML IV - AN
Freq: Once | INTRAVENOUS | Status: DC | PRN
Start: 2022-11-23 — End: 2022-11-23
  Administered 2022-11-23 (×3): 20 mg via INTRAVENOUS
  Administered 2022-11-23: 70 mg via INTRAVENOUS
  Administered 2022-11-23: 20 mg via INTRAVENOUS

## 2022-11-23 NOTE — PROVATION-GI (Signed)
[facility]  Patient Name: Eric Kemp  MRN: [id number]  CSN: [id number]  Date of Birth: Nov 04, 1940  Admit Type: Outpatient  Age: 82  Gender: Male  Note Status: Finalized  Patient Location: [facility]  Referring MD:          Rolly Salter MD, MD  Procedure Date:        11/23/2022 11:03:54 AM  Procedure:             Colonoscopy  Endoscopist:           Duayne Cal MD, MD  Indications for Procedure:       High risk colon cancer surveillance: Personal history of colonic polyps  Medications:           Monitored Anesthesia Care  Procedure:       Just prior to the procedure, an updated history and physical was done. I        obtained an informed consent from the patient reviewing the risk of the        procedure including (but not limited to) respiratory depression, perforation,        bleeding, discomfort, a possible need for surgery and unexpected reactions to        medications. The patient is aware that test has limitations and may not        detect significant lesions such as cancer or other potential diseases. The        patient was also informed that they might need a repeat colonoscopy earlier        than standard guidelines if there are changes in their symptoms or concerning        findings noted. A time out was performed with the entire procedure staff        present. The scope was passed under direct vision. Throughout the procedure,        the patient's blood pressure, pulse, and oxygen saturations were monitored        continuously. The Colonoscope was introduced through the anus and advanced to        the cecum, identified by appendiceal orifice and ileocecal valve. The scope        was then slowly withdrawn with confirmation of the noted findings. The        colonoscopy was performed without difficulty. The patient tolerated the        procedure well. The quality of the bowel preparation was adequate. The total        duration of the procedure was 13 minutes. Scope withdrawal time was 8  minutes.  Findings:       The perianal and digital rectal examinations were normal. Pertinent negatives        include normal sphincter tone.       A 5 mm polyp was found in the descending colon. The polyp was sessile. The        polyp was removed with a jumbo cold forceps. Resection and retrieval were        complete.       Internal hemorrhoids were found during retroflexion. The hemorrhoids were        small and Grade I (internal hemorrhoids that do not prolapse).  Scope Withdrawal Time: 0 hours 8 minutes 15 seconds   Post Procedure Diagnosis:       - One 5 mm polyp in the descending colon, removed with a jumbo cold forceps.        Resected and  retrieved.       - Internal hemorrhoids.  Complications:         No immediate complications.  Estimated Blood Loss:  Estimated blood loss was minimal.  Recommendation:       - Await pathology results.       - High fiber diet.       - Decision to repeat colonoscopy in 5 years should be made between PCP and        patient given advanced age and health status at that time.  Knox Saliva, MD  Legrand Como Laurie Panda MD, MD  11/23/2022 2:04:18 PM  This report has been signed electronically.  Number of Addenda: 0  Note Initiated On: 11/23/2022 11:03 AM

## 2022-11-23 NOTE — H&P (Signed)
H&P NOTE     Chief Complaint:   History of colon polyps    PHI:  This 82 year old year old English speaking patient who presents for consideration of a surveillance colonoscopy.    The patient denies any symptoms currently.  He has had no melena, hematochezia, abdominal pain or weight loss.     No abdominal surgeries  Fhx of CRC in father in 32s  No family history of colon polyps  No blood thinners    Patient Active Problem List:     Melanoma of skin ([place])     Dyslipidemia     Calculus of kidney     Essential hypertension     Plantar fasciitis        Review of Patient's Allergies indicates:   Sulfa antibiotics       Hives, Rash       Social History     Socioeconomic History    Marital status: Married     Spouse name: Not on file    Number of children: Not on file    Years of education: Not on file    Highest education level: Not on file   Occupational History    Not on file   Tobacco Use    Smoking status: Never    Smokeless tobacco: Never   Substance and Sexual Activity    Alcohol use: Yes    Drug use: Not on file    Sexual activity: Not on file   Other Topics Concern    Not on file   Social History Narrative    Not on file   Social Determinants of Health  Financial Resource Strain: Not on file  Food Insecurity: Not on file  Transportation Needs: Not on file  Physical Activity: Not on file  Stress: Not on file  Social Connections: Not on file  Intimate Partner Violence: Not on file  Housing Stability: Not on file      Current Outpatient Medications   Medication Sig    polyethylene glycol (GOLYTELY) 236 g suspension Take 4,000 mLs by mouth See Admin Instructions Take as directed prior to your colonoscopy    Simethicone 80 MG TABS Take 4 tablets by mouth See Admin Instructions Take as directed prior to colonoscopy.    amLODIPine (NORVASC) 10 MG tablet Take 1 tablet by mouth in the morning.    ketoconazole (NIZORAL) 2 % cream Apply topically 2 (two) times daily For sides of nose and brows, as needed.    albuterol  HFA 108 (90 Base) MCG/ACT inhaler Inhale 2 puffs into the lungs every 6 (six) hours as needed for Wheezing  for up to 14 days     Current Facility-Administered Medications   Medication    lactated ringers infusion         Review of patient's family history indicates:  Problem: Heart      Relation: Mother          Age of Onset: 10          Comment: had bypass, dementia  Problem: Mental/Emotional Disorders      Relation: Mother          Age of Onset: (Not Specified)          Comment: dementia  Problem: Cancer - Ovarian      Relation: Mother          Age of Onset: 40          Comment: early diagnosis and removal  Problem: Heart      Relation: Father          Age of Onset: (Not Specified)  Problem: Stroke      Relation: Father          Age of Onset: (Not Specified)  Problem: Cancer - Colon      Relation: Father          Age of Onset: (Not Specified)          Comment: died from a blockage - 59s        REVIEW OF SYSTEMS:   Cardiovascular:  No chest pain, LE edema  Pulmonary:  No SOB or cough  Neuro:  No focal weakness, seizures    Physical Exam:  Vital Signs: BP 139/68   Pulse 64   Temp 98.2 F (36.8 C) (Temporal)   Resp 16   SpO2 97%   General: Awake, alert, NAD  Pulmonary: Normal WOB, no wheezing  Cardiovascular: RRR, no murmurs  Abdominal: soft, NT/ND  Neuro: Nonfocal, moving all extremities symmetrically    Labs:  Lab Results   Component Value Date    NA 136 06/23/2022    K 3.9 06/23/2022    CL 97 (L) 06/23/2022    CO2 26 06/23/2022    BUN 14 06/23/2022    CREAT 1.2 06/23/2022    GLUCOSER 122 06/23/2022     No results found for: "WBC", "HCT", "PLTA", "PT", "INR", "APTT"    ASA 2    Impression:  History of colon polyps, Fhx of CRC    Medical Decision Making:  The patient will have a colonoscopy done. The patient was informed of the risk of bleeding, discomfort, perforation, a need for surgery, infection, drug reaction, cardiovascular and cerebrovascular compromise and the possibility of missing a lesion or  problem.  He understood these risks and consented to the exam.     All questions were answered. The patient is in agreement with our plan. The patient will have MAC anesthesia support during their procedure.     Knox Saliva, MD, 11/23/2022

## 2022-11-23 NOTE — Anesthesia Preprocedure Evaluation (Signed)
Pre-Anesthetic Note  .      Patient: Eric Kemp is a 82 year old male      Procedure Information       Date/Time: 11/23/22 1310    Scheduled providers: Knox Saliva, MD; Noemi Chapel, MD    Procedure: COLONOSCOPY    Diagnosis: Screen for colon cancer [Z12.11]    Location: [facility] - Gastroenterology            Relevant Problems   No relevant active problems           Previous Anesthetic History:   No past surgical history on file.        Medications  Current Outpatient Medications   Medication Sig    polyethylene glycol (GOLYTELY) 236 g suspension Take 4,000 mLs by mouth See Admin Instructions Take as directed prior to your colonoscopy    Simethicone 80 MG TABS Take 4 tablets by mouth See Admin Instructions Take as directed prior to colonoscopy.    amLODIPine (NORVASC) 10 MG tablet Take 1 tablet by mouth in the morning.    ketoconazole (NIZORAL) 2 % cream Apply topically 2 (two) times daily For sides of nose and brows, as needed.    albuterol HFA 108 (90 Base) MCG/ACT inhaler Inhale 2 puffs into the lungs every 6 (six) hours as needed for Wheezing  for up to 14 days     No current facility-administered medications for this encounter.         Allergies:   Review of Patient's Allergies indicates:   Sulfa antibiotics       Hives, Rash    Smoking, Alcohol, Drugs:  Social History    Tobacco Use      Smoking status: Never      Smokeless tobacco: Never    Alcohol use: Yes      Drug use:   Not on file       PMHx:  No past medical history on file.    Vitals  There were no vitals taken for this visit.    Review of Systems            Cardiovascular:  Positive for hypercholesterolemia, hypertension and arrhythmia (atrial flutter.Pt reports having only one episode).   Constitutional:         Melanoma       Physical Exam    General     Level of consciousness:  Alert and oriented (time, person, place)   BMI    Airway     Mallampati:  II    TM distance:  >3 FB    Mouth opening:  >3 FB    Neck  ROM:  Full   Teeth  - normal exam  }   Heart   - normal exam     Lungs - normal exam                   Pertinent Labs:   Lab Results   Component Value Date    NA 136 06/23/2022    K 3.9 06/23/2022    CREAT 1.2 06/23/2022    GLUCOSER 122 06/23/2022         Anesthesia Plan    ASA Score:     ASA:  2    Airway:      Mallampati:  II    Mouth opening:  >3 FB    Neck ROM:  Full    TM distance:  >3 FB  Plan: general    Other information:     Full Stomach Precaution:: No      Post-Plan::  PACU    Informed Consent:     Anesthetic plan and risks discussed with:  Patient   Patient Consented        Attending Anesthesiologist Statement:     Reassessed day of surgery: Yes        Assessment made, necessary equipment and appropriate plan in place.

## 2022-11-23 NOTE — Anesthesia Postprocedure Evaluation (Signed)
Anesthesia Post-Operative Evaluation Note    Patient: Eric Kemp           Procedure Summary       Date: 11/23/22 Room / Location: [facility] - Gastroenterology    Anesthesia Start:  Anesthesia Stop:     Procedure: COLONOSCOPY Diagnosis: Screen for colon cancer    Scheduled Providers: Knox Saliva, MD; Noemi Chapel, MD Responsible Provider:     Anesthesia Type: general ASA Status: 2              POST-OPERATIVE EVALUATION    Anesthesia Post Evaluation    Vitals signs in patient's normal range: Yes  Respiratory function stable; airway patent: Yes  Cardiovascular function stable: Yes  Hydration status stable: Yes  Mental status recovered; patient participates in evaluation and/or is at baseline: Yes  Pain control satisfactory: Yes  Nausea and vomiting control satisfactory: YesProcedure was labor & delivery no  PostOP disposition PACU  Anesthesia Observation no significant observation    MIPS#404 Anesthesiology Smoking Abstinence:     The patient is a current smoker (e.g. cigarette, cigar, pipe, e-cigarette/vaping/marijuana) [id number]): No   FOR CODING USE ONLY: IF BLANK [id number]    MIPS#477 Multimodal Pain Management:  Emergent - Exclusion case: No  Patient was administered multimodal pain management (two or more drugs and/or interventions excluding systemic opioids) in the perioperative period; occurring at some time between 6 hours prior to anesthesia start time until discharged from: No   List medical reason(s) patient did not receive multimodal pain management [id number]): no pain anticipated postop  FOR CODING USE ONLY: REASON NOT LISTED [id number] Perioperative Temperature Management:  Anesthesia start to Anesthesia end time was 60 minutes or longer (4256F): No   FOR CODING USE ONLY: REASON NOT LISTED [id number]    MIPS#430 Adult Prevention of PONV:  Patient received an inhalational anesthetic [id number]): No  FOR CODING USE ONLY: REASON NOT LISTED [id number]    MIPS#463 Pediatric Prevention  of PONV:  Pediatric patient?: No  FOR CODING USE ONLY: REASON NOT LISTED [id number]        [employer] # [id number]        Last vitals    BP: 139/68 (11/23/2022  1:21 PM)  Temp: 98.2 F (36.8 C) (11/23/2022  1:21 PM)  Pulse: 64 (11/23/2022  1:21 PM)  Resp: 16 (11/23/2022  1:21 PM)  SpO2: 97 % (11/23/2022  1:21 PM)

## 2022-11-27 LAB — SURGICAL PATH SPECIMEN GASTROINTESTINAL

## 2022-11-29 ENCOUNTER — Encounter (HOSPITAL_BASED_OUTPATIENT_CLINIC_OR_DEPARTMENT_OTHER): Payer: Self-pay | Admitting: Gastroenterology

## 2022-12-22 DIAGNOSIS — Z125 Encounter for screening for malignant neoplasm of prostate: Secondary | ICD-10-CM | POA: Diagnosis not present

## 2022-12-22 DIAGNOSIS — I10 Essential (primary) hypertension: Secondary | ICD-10-CM | POA: Diagnosis not present

## 2022-12-22 DIAGNOSIS — R7989 Other specified abnormal findings of blood chemistry: Secondary | ICD-10-CM | POA: Diagnosis not present

## 2022-12-22 DIAGNOSIS — R7303 Prediabetes: Secondary | ICD-10-CM | POA: Diagnosis not present

## 2022-12-22 DIAGNOSIS — E785 Hyperlipidemia, unspecified: Secondary | ICD-10-CM | POA: Diagnosis not present

## 2022-12-22 DIAGNOSIS — E041 Nontoxic single thyroid nodule: Secondary | ICD-10-CM | POA: Diagnosis not present

## 2022-12-29 DIAGNOSIS — D170 Benign lipomatous neoplasm of skin and subcutaneous tissue of head, face and neck: Secondary | ICD-10-CM | POA: Diagnosis not present

## 2022-12-29 DIAGNOSIS — R12 Heartburn: Secondary | ICD-10-CM | POA: Diagnosis not present

## 2022-12-29 DIAGNOSIS — M170 Bilateral primary osteoarthritis of knee: Secondary | ICD-10-CM | POA: Diagnosis not present

## 2022-12-29 DIAGNOSIS — E785 Hyperlipidemia, unspecified: Secondary | ICD-10-CM | POA: Diagnosis not present

## 2022-12-29 DIAGNOSIS — Z1331 Encounter for screening for depression: Secondary | ICD-10-CM | POA: Diagnosis not present

## 2022-12-29 DIAGNOSIS — R7303 Prediabetes: Secondary | ICD-10-CM | POA: Diagnosis not present

## 2022-12-29 DIAGNOSIS — D6869 Other thrombophilia: Secondary | ICD-10-CM | POA: Diagnosis not present

## 2022-12-29 DIAGNOSIS — I4891 Unspecified atrial fibrillation: Secondary | ICD-10-CM | POA: Diagnosis not present

## 2022-12-29 DIAGNOSIS — Z0000 Encounter for general adult medical examination without abnormal findings: Secondary | ICD-10-CM | POA: Diagnosis not present

## 2022-12-29 DIAGNOSIS — J439 Emphysema, unspecified: Secondary | ICD-10-CM | POA: Diagnosis not present

## 2022-12-29 DIAGNOSIS — I25118 Atherosclerotic heart disease of native coronary artery with other forms of angina pectoris: Secondary | ICD-10-CM | POA: Diagnosis not present

## 2022-12-29 DIAGNOSIS — I10 Essential (primary) hypertension: Secondary | ICD-10-CM | POA: Diagnosis not present

## 2022-12-29 DIAGNOSIS — D692 Other nonthrombocytopenic purpura: Secondary | ICD-10-CM | POA: Diagnosis not present

## 2022-12-29 DIAGNOSIS — R82998 Other abnormal findings in urine: Secondary | ICD-10-CM | POA: Diagnosis not present

## 2022-12-29 DIAGNOSIS — I700 Atherosclerosis of aorta: Secondary | ICD-10-CM | POA: Diagnosis not present

## 2022-12-29 DIAGNOSIS — Z1339 Encounter for screening examination for other mental health and behavioral disorders: Secondary | ICD-10-CM | POA: Diagnosis not present

## 2023-01-03 DIAGNOSIS — H905 Unspecified sensorineural hearing loss: Secondary | ICD-10-CM | POA: Diagnosis not present

## 2023-01-08 DIAGNOSIS — H353132 Nonexudative age-related macular degeneration, bilateral, intermediate dry stage: Secondary | ICD-10-CM | POA: Diagnosis not present

## 2023-01-17 DIAGNOSIS — H353123 Nonexudative age-related macular degeneration, left eye, advanced atrophic without subfoveal involvement: Secondary | ICD-10-CM | POA: Diagnosis not present

## 2023-01-17 DIAGNOSIS — H353112 Nonexudative age-related macular degeneration, right eye, intermediate dry stage: Secondary | ICD-10-CM | POA: Diagnosis not present

## 2023-01-17 DIAGNOSIS — H43813 Vitreous degeneration, bilateral: Secondary | ICD-10-CM | POA: Diagnosis not present

## 2023-01-26 ENCOUNTER — Ambulatory Visit: Payer: Medicare Other | Attending: Dermatology | Admitting: Dermatology

## 2023-01-26 ENCOUNTER — Other Ambulatory Visit: Payer: Self-pay

## 2023-01-26 DIAGNOSIS — D229 Melanocytic nevi, unspecified: Secondary | ICD-10-CM | POA: Diagnosis present

## 2023-01-26 DIAGNOSIS — Z85820 Personal history of malignant melanoma of skin: Secondary | ICD-10-CM | POA: Diagnosis present

## 2023-01-26 DIAGNOSIS — L578 Other skin changes due to chronic exposure to nonionizing radiation: Secondary | ICD-10-CM

## 2023-01-26 NOTE — Progress Notes (Signed)
[facility]  [address], [place], [place] [address]    Dermatology Clinic Note    CC: skin check.   ?  HPI: Eric Kemp is a 82 year old male     He reports a history of a melanoma on his back. Unclear depth but per notes by Dr. Sydell Axon it seems it most likely an insitu on the R back in 2017.   ?  ROS: negative, no other skin complaints.  Feels well, no systemic complaints.   ?  Past Dermatologic hx:    SPEC #: [id number]       RECD: 06/30/22-1454  STATUS: SOUT  SP TYPE: DERM            COLL: 06/30/22-  SUB DR: Marlowe Shores MD     ENTERED:  06/30/22-1454     ORDERED:  19147 (TYPE 4), L1-L3/2, L4-L6/2, L7-L9/2     Performed at [facility], [facility]  [address], [place] [address] [phone]     >>FINAL DIAGNOSIS<<     SKIN SHAVE BIOPSY, LEFT BACK:  - Dermal nevus; present at the deep margin.  - Associated superficial dermal scar, consistent with trauma.     Note:  After review of the entire case, Kandra Nicolas MD of [facility] has  rendered the above diagnosis, with which I concur.     Diagnosis by: Jonnie Kind, MD     ?  PMH:  Patient Active Problem List:     Melanoma of skin (HCC)     Dyslipidemia     Calculus of kidney     Essential hypertension     Plantar fasciitis    ?  Meds:  ?     Medication List            Accurate as of June 30, 2022  1:02 PM. If you have any questions, ask your nurse or doctor.                CONTINUE taking these medications      albuterol HFA 108 (90 Base) MCG/ACT inhaler  Inhale 2 puffs into the lungs every 6 (six) hours as needed for Wheezing  for up to 14 days     amLODIPine 10 MG tablet  Commonly known as: NORVASC  Take 1 tablet by mouth in the morning.     amoxicillin-clavulanate 875-125 MG per tablet  Commonly known as: AUGMENTIN  Take 1 tablet by mouth in the morning and 1 tablet before bedtime. Do all this for 7 days.            ?  All:  Review of Patient's Allergies indicates:   Sulfa antibiotics        Hives, Rash?     SHx:  ?Social History    Tobacco Use      Smoking status: Never      Smokeless tobacco: Never    Alcohol use: Yes       FHx:  Hx of melanoma - , hx of NMSC -  ?  Physical exam/Assessment/Plan:  Well appearing pt in NAD  Mood and affect are wnl  A full skin examination was performed including scalp, head, eyes, ears, nose,  lips,  neck, chest, axillae, abdomen, back, buttocks, bilateral upper extremities, bilateral lower extremities, hands, feet, fingers, toes, fingernails, and toenails.   Skin type: II    # Benign nevi, scattered on trunk and extremities: benign appearing brown macules and papules,  most <78mm with no concerning features on exam or dermoscopy.   - Skin self-examination reviewed; ABCD's discussed.  - Sun protection and avoidance reviewed, including proper use of broad-spectrum UVB/UVA sunscreens with SPF 30 or greater, re-application after swimming and q 3-4 hours emphasized.?  Monitor:   - L abd now 5x43mm (prior 4x80mm) speckeld macule with reticular pattern, new photo taken  - L wrist 8x55mm tan macule with heiroglyphic pattern.   -- both unchanged.     # Dermatoheliosis of sun-exposed areas. Benign tan macules with moth-eaten borders c/w solar lentigines present.   - Skin self-examination reviewed; ABCD's discussed.   ??- Sun protection and avoidance reviewed, including proper use of broad-spectrum UVB/UVA sunscreens with SPF 30 or greater, re-application after swimming and q 3-4 hours emphasized.    # History of melanoma. Unknown depth, R back 2017.   - Well healed scar, NER.   - No cervical, axillary, supraclavicular, or inguinal lymphadenopathy appreciated on exam.   - ROS negative.   - Skin self-examination reviewed; ABCD's discussed.   ??- Sun protection and avoidance reviewed, including proper use of broad-spectrum UVB/UVA sunscreens with SPF 30 or greater, re-application after swimming and q 3-4 hours emphasized.          RTC: 59mo TBSE      ?  ?  ?  Maudry Mayhew, MD  [facility]  Dermatology    Melanoma quality documentation:   Melanoma staging: unknown  Next screening skin examination scheduled: YES   EPIC reminder message sent: YES  Pt added to dermatology melanoma list: YES           CC: Mercer Pod, MD  [address]  [place] [address]

## 2023-01-30 DIAGNOSIS — H905 Unspecified sensorineural hearing loss: Secondary | ICD-10-CM | POA: Diagnosis not present

## 2023-02-11 ENCOUNTER — Other Ambulatory Visit (HOSPITAL_BASED_OUTPATIENT_CLINIC_OR_DEPARTMENT_OTHER): Payer: Self-pay | Admitting: Cardiovascular Disease

## 2023-02-19 ENCOUNTER — Other Ambulatory Visit (HOSPITAL_COMMUNITY): Payer: Self-pay | Admitting: *Deleted

## 2023-02-19 MED ORDER — POTASSIUM CHLORIDE ER 10 MEQ PO TBCR: 20.0000 meq | EXTENDED_RELEASE_TABLET | Freq: Every day | ORAL | 6 refills | Status: DC

## 2023-03-09 DIAGNOSIS — H401131 Primary open-angle glaucoma, bilateral, mild stage: Secondary | ICD-10-CM | POA: Diagnosis not present

## 2023-03-21 DIAGNOSIS — M170 Bilateral primary osteoarthritis of knee: Secondary | ICD-10-CM | POA: Diagnosis not present

## 2023-03-21 DIAGNOSIS — L980 Pyogenic granuloma: Secondary | ICD-10-CM | POA: Diagnosis not present

## 2023-03-22 ENCOUNTER — Encounter (HOSPITAL_BASED_OUTPATIENT_CLINIC_OR_DEPARTMENT_OTHER): Payer: Self-pay | Admitting: Family Medicine

## 2023-03-22 DIAGNOSIS — I10 Essential (primary) hypertension: Secondary | ICD-10-CM

## 2023-03-28 DIAGNOSIS — M170 Bilateral primary osteoarthritis of knee: Secondary | ICD-10-CM | POA: Diagnosis not present

## 2023-03-30 DIAGNOSIS — L980 Pyogenic granuloma: Secondary | ICD-10-CM | POA: Diagnosis not present

## 2023-04-04 ENCOUNTER — Encounter (HOSPITAL_COMMUNITY): Payer: Self-pay | Admitting: Physician Assistant

## 2023-04-04 ENCOUNTER — Ambulatory Visit (HOSPITAL_COMMUNITY)
Admission: RE | Admit: 2023-04-04 | Discharge: 2023-04-04 | Disposition: A | Discharge status: Home / Self Care | Payer: PPO | Source: Ambulatory Visit | Attending: Physician Assistant | Admitting: Physician Assistant

## 2023-04-04 VITALS — BP 126/66 | HR 63 | Ht 72.0 in | Wt 210.8 lb

## 2023-04-04 DIAGNOSIS — I10 Essential (primary) hypertension: Secondary | ICD-10-CM | POA: Diagnosis not present

## 2023-04-04 DIAGNOSIS — D6869 Other thrombophilia: Secondary | ICD-10-CM | POA: Diagnosis not present

## 2023-04-04 DIAGNOSIS — M170 Bilateral primary osteoarthritis of knee: Secondary | ICD-10-CM | POA: Diagnosis not present

## 2023-04-04 DIAGNOSIS — E785 Hyperlipidemia, unspecified: Secondary | ICD-10-CM | POA: Diagnosis present

## 2023-04-04 DIAGNOSIS — I2510 Atherosclerotic heart disease of native coronary artery without angina pectoris: Secondary | ICD-10-CM | POA: Insufficient documentation

## 2023-04-04 DIAGNOSIS — I4819 Other persistent atrial fibrillation: Secondary | ICD-10-CM | POA: Insufficient documentation

## 2023-04-04 NOTE — Progress Notes (Signed)
Primary Care Physician: Charlane Ferretti, DO Primary Cardiologist: Dr Excell Seltzer Primary Electrophysiologist: Dr Elberta Fortis  Referring Physician: [facility]   Kevin Day is a 82 y.o. male with a history of HLD, aortic atherosclerosis, dilated aortic root, HTN, atrial fibrillation who presents for follow up in the [facility].  The patient was initially diagnosed with atrial fibrillation 04/01/21 after presenting to the ED with symptoms of chest discomfort and SOB. ECG showed rate controlled afib. His K+ was 2.9. he was started on Eliquis for a CHADS2VASC score of 4, metoprolol, and KCL. Patient noticed by his apple watch that he went into afib Monday 11/14/21. He contacted Dr. Earmon [place] office on that day and metoprolol was increased to 25 mg bid. He said if not for his apple watch, he would not have paid much attention to his heart rhythm. Afib  has occurred on the tail end  of 2 steroid tapers for neck pain, which is much better. Patient is s/p DCCV 12/01/21. He noted on his smart watch that he was back in afib on 12/13/21.   Patient is s/p afib ablation with Dr Elberta Fortis on 01/06/22.   On follow up today, patient reports that he has done well since his last visit. He has not had any interim symptoms of afib. No episodes detected on his smart watch. No bleeding issues on anticoagulation.   Today, he denies symptoms of palpitations, shortness of breath, orthopnea, PND, lower extremity edema, presyncope, syncope, snoring, daytime somnolence, bleeding, or neurologic sequela. The patient is tolerating medications without difficulties and is otherwise without complaint today.    Atrial Fibrillation Risk Factors:  he does not have symptoms or diagnosis of sleep apnea. he does not have a history of rheumatic fever. he does not have a history of alcohol use.   Atrial Fibrillation Management history:  Previous antiarrhythmic drugs: none Previous cardioversions: 12/01/21 Previous  ablations: 01/06/22 Anticoagulation history: Eliquis   Past Medical History:  Diagnosis Date   Arthritis    Cataracts, bilateral    Hyperlipidemia    Hypertension    Prostate cancer (HCC)    Prostate cancer (HCC)    Prostate cancer (HCC)    Ruptured lumbar disc    Skin cancer     Current Outpatient Medications  Medication Sig Dispense Refill   apixaban (ELIQUIS) 5 MG TABS tablet TAKE 1 TABLET(5 MG) BY MOUTH TWICE DAILY 180 tablet 2   ENTRESTO 24-26 MG TAKE 1 TABLET BY MOUTH TWICE DAILY 60 tablet 9   Fish Oil-Cholecalciferol (OMEGA-3 FISH OIL/VITAMIN D3 PO) Take 3 tablets by mouth every morning.     metoprolol tartrate (LOPRESSOR) 25 MG tablet Take 1 tablet (25 mg total) by mouth 2 (two) times daily. 180 tablet 2   Multiple Vitamins-Minerals (OCUVITE PO) Take 1 tablet by mouth at bedtime.     pantoprazole (PROTONIX) 20 MG tablet Take 20 mg by mouth as needed.     potassium chloride (KLOR-CON) 10 MEQ tablet Take 2 tablets (20 mEq total) by mouth daily. 60 tablet 6   rosuvastatin (CRESTOR) 20 MG tablet TAKE 1 TABLET(20 MG) BY MOUTH DAILY 90 tablet 0   triamterene-hydrochlorothiazide (MAXZIDE-25) 37.5-25 MG tablet Take 1 tablet by mouth daily.     No current facility-administered medications for this encounter.    ROS- All systems are reviewed and negative except as per the HPI above.  Physical Exam: Vitals:   04/04/23 0817  BP: 126/66  Pulse: 63  Weight: 95.6 kg  Height: 6' (1.829 m)    GEN: Well nourished, well developed in no acute distress NECK: No JVD; No carotid bruits CARDIAC: Regular rate and rhythm, no murmurs, rubs, gallops RESPIRATORY:  Clear to auscultation without rales, wheezing or rhonchi  ABDOMEN: Soft, non-tender, non-distended EXTREMITIES:  No edema; No deformity    Wt Readings from Last 3 Encounters:  04/04/23 95.6 kg  10/04/22 96.2 kg  04/10/22 95.8 kg    EKG today demonstrates  SR Vent. rate 63 BPM PR interval 192 ms QRS duration 98  ms QT/QTcB 412/421 ms   Echo 03/20/21 demonstrated   1. The left ventricle has normal systolic function, with an ejection  fraction of 55-60%. The cavity size was normal. Left ventricular diastolic Doppler parameters are consistent with impaired relaxation.   2. The average left ventricular global longitudinal strain is normal at  -18.8 %.   3. The right ventricle has normal systolic function. The cavity was  normal. There is no increase in right ventricular wall thickness.   4. There is mild mitral annular calcification present.   5. The aortic valve has an indeterminate number of cusps. Moderate  sclerosis of the aortic valve. Aortic valve regurgitation is mild by color  flow Doppler.   6. The aorta is abnormal in size and structure.   7. There is mild dilatation of the aortic root and of the ascending aorta  measuring 41 mm and 42mm respectively.   Epic records are reviewed at length today  CHA2DS2-VASc Score = 4  The patient's score is based upon: CHF History: 0 HTN History: 1 Diabetes History: 0 Stroke History: 0 Vascular Disease History: 1 Age Score: 2 Gender Score: 0        ASSESSMENT AND PLAN: Persistent atrial fibrillation  The patient's CHA2DS2-VASc score is 4, indicating a 4.8% annual risk of stroke.   S/p afib ablation 01/06/22 Patient appears to be maintaining SR.  Continue Lopressor 25 mg BID Continue Eliquis 5 mg BID  Apple Watch for home monitoring   Secondary Hypercoagulable State (ICD10:  D68.69) The patient is at significant risk for stroke/thromboembolism based upon his CHA2DS2-VASc Score of 4.  Continue Apixaban (Eliquis).   HTN Stable on current regimen  CAD On statin No anginal symptoms   Follow up with Dr Elberta Fortis per recall.   [name] PA-C [facility] [address] [place], [place] [address] [phone]

## 2023-04-10 ENCOUNTER — Ambulatory Visit (HOSPITAL_COMMUNITY): Payer: PPO | Admitting: Physician Assistant

## 2023-04-27 DIAGNOSIS — Z23 Encounter for immunization: Secondary | ICD-10-CM | POA: Diagnosis not present

## 2023-05-16 ENCOUNTER — Other Ambulatory Visit (HOSPITAL_BASED_OUTPATIENT_CLINIC_OR_DEPARTMENT_OTHER): Payer: Self-pay | Admitting: Cardiovascular Disease

## 2023-05-17 DIAGNOSIS — M170 Bilateral primary osteoarthritis of knee: Secondary | ICD-10-CM | POA: Diagnosis not present

## 2023-05-21 ENCOUNTER — Telehealth: Payer: Self-pay | Admitting: *Deleted

## 2023-05-21 NOTE — Telephone Encounter (Signed)
Name: Kevin Day  DOB: 11/17/1940  MRN: [id number]  Primary Cardiologist: Tonny Bollman, MD  Chart reviewed as part of pre-operative protocol coverage. Because of [name]'s past medical history and time since last visit, he will require a follow-up in-office visit in order to better assess preoperative cardiovascular risk. Please schedule with general cardiology team. Last [facility] visit was >1 year ago in 03/2022. Has been seeing AF clinic in interim and they do not do clearances.  Pre-op covering staff: - Please schedule appointment and call patient to inform them. If patient already had an upcoming appointment within acceptable timeframe, please add "pre-op clearance" to the appointment notes so provider is aware. - Please contact requesting surgeon's office via preferred method (i.e, phone, fax) to inform them of need for appointment prior to surgery.  This message will also be routed to pharmacy pool for input on holding apixaban as requested below so that this information is available to the clearing provider at time of patient's appointment.   Laurann [place], PA-C  05/21/2023, 4:43 PM

## 2023-05-21 NOTE — Telephone Encounter (Signed)
1st attempt to reach pt to schedule IN OFFICE visit for clearance. Phone rang and no VM came on

## 2023-05-21 NOTE — Telephone Encounter (Signed)
Pre-operative Risk Assessment    Patient Name: Kevin Day  DOB: 02/05/1941 MRN: [id number]      Request for Surgical Clearance    Procedure:   Right Total Knee Arthroplasty  Date of Surgery:  Clearance 08/20/23                                 Surgeon:  Dr. Trudee Grip Surgeon's Group or Practice Name:  [facility] Phone number:  [phone] Fax number:  [phone]   Type of Clearance Requested:   - Medical  - Pharmacy:  Hold Apixaban (Eliquis) Not Indicated.   Type of Anesthesia:   Choice   Additional requests/questions:    Signed, Emmit Pomfret   05/21/2023, 3:40 PM

## 2023-05-22 ENCOUNTER — Other Ambulatory Visit (HOSPITAL_COMMUNITY): Payer: Self-pay | Admitting: Physician Assistant

## 2023-05-22 NOTE — Telephone Encounter (Signed)
Patient with diagnosis of afib on Eliquis for anticoagulation.    Procedure: right TKA Date of procedure: 08/20/23  CHA2DS2-VASc Score = 4  This indicates a 4.8% annual risk of stroke. The patient's score is based upon: CHF History: 0 HTN History: 1 Diabetes History: 0 Stroke History: 0 Vascular Disease History: 1 Age Score: 2 Gender Score: 0  Requires updated BMET and CBC before clearance can be provided. If labs are stable, patient can hold Eliquis for 3 days prior to procedure.    **This guidance is not considered finalized until pre-operative APP has relayed final recommendations.**

## 2023-05-24 NOTE — Telephone Encounter (Signed)
Spoke with patient and scheduled.

## 2023-06-07 DIAGNOSIS — I4891 Unspecified atrial fibrillation: Secondary | ICD-10-CM | POA: Diagnosis not present

## 2023-06-07 DIAGNOSIS — I25118 Atherosclerotic heart disease of native coronary artery with other forms of angina pectoris: Secondary | ICD-10-CM | POA: Diagnosis not present

## 2023-06-07 DIAGNOSIS — I712 Thoracic aortic aneurysm, without rupture, unspecified: Secondary | ICD-10-CM | POA: Diagnosis not present

## 2023-06-07 DIAGNOSIS — Z23 Encounter for immunization: Secondary | ICD-10-CM | POA: Diagnosis not present

## 2023-06-07 DIAGNOSIS — J439 Emphysema, unspecified: Secondary | ICD-10-CM | POA: Diagnosis not present

## 2023-06-07 DIAGNOSIS — R0781 Pleurodynia: Secondary | ICD-10-CM | POA: Diagnosis not present

## 2023-06-21 DIAGNOSIS — M170 Bilateral primary osteoarthritis of knee: Secondary | ICD-10-CM | POA: Diagnosis not present

## 2023-06-22 ENCOUNTER — Other Ambulatory Visit: Payer: Self-pay | Admitting: Cardiovascular Disease

## 2023-06-22 DIAGNOSIS — I480 Paroxysmal atrial fibrillation: Secondary | ICD-10-CM

## 2023-06-22 DIAGNOSIS — I428 Other cardiomyopathies: Secondary | ICD-10-CM

## 2023-07-06 DIAGNOSIS — H401132 Primary open-angle glaucoma, bilateral, moderate stage: Secondary | ICD-10-CM | POA: Diagnosis not present

## 2023-07-06 NOTE — Progress Notes (Signed)
Ocular History - Reviewed  POST VITREOUS DETACHMENT (ICD10-H43.819)  retinal tears OU; s/p laser  pseudophakic OU   EPIRETINAL MEMBRANE; LEFT EYE ICD10-H35.372  POAG- dx 2022  Ocular Medications - Reviewed  Latanoprost in both eyes qhs   Systane PRN both eyes  Past Medical History - Reviewed  healthy  Past Surgical History - Reviewed  BUL Blepharoplasty ~2013 still ongoing per pt.  2014: HST x2 left eye, Ret Hole right eye - s/p LRP Dr. Ivan Croft   2017: HST right eye - s/p LRP 04/2016 Dr. Nelia Shi, [place]   ~2019: s/p PPV for floaters - Dr. Kathryne Hitch    PCIOL 2020? in [place]  Family History - Reviewed  Glaucoma - F  ARMD in Mother and Father     Assessment & Plan  1) PRIMARY OPEN-ANGLE GLAUCOMA, BILATERAL, MILD-MOD STAGE L>R  - dx 12/2020; based on OHT/ CDR; + FHx (F) CCT 578/583; TM -20/24  - HVF(7/24): R- ?rel, VFI 99(99,99), mild nonspec def; L- ?rel, VFI 95(99,96), mild IAS,SNS - ? Prog   - OCT(3/24): R- [id number]), rel, mild inf thin, stable; L- 80(81,78), rel, mod inf thin, stable     - GCL(3/24): R- 73(73), mild inf thin; L- ##(52), alg fail; hx gen thin inf>sup   Assess: IOP better/ now stable; last HVFs okay - ? 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 Ivan Croft), again 2017 (Dr. Kathryne Hitch in [place])   - ? hx PPV left eye ~ 2019 with Dr.London  A/P: stable symptoms - RD precautions   3) EPIRETINAL MEMBRANE, LEFT EYE (ICD10-H35.372)  - OCT(4/24): R- 425(956,387)- mild erm, sl fov irreg; L- 564(332,951), + mod erm with irreg fov - gen stable   - have reviewed with patient; follow   4) PCIOL s/p yag cap both eyes   - ? [place]   5) Min M/A    Keep 11/09/23 CE/ OCT-G/GCL/R  Sched LE/ HVF ~ 02/2024

## 2023-07-10 DIAGNOSIS — I5022 Chronic systolic (congestive) heart failure: Secondary | ICD-10-CM | POA: Diagnosis not present

## 2023-07-10 DIAGNOSIS — I4891 Unspecified atrial fibrillation: Secondary | ICD-10-CM | POA: Diagnosis not present

## 2023-07-10 DIAGNOSIS — R7303 Prediabetes: Secondary | ICD-10-CM | POA: Diagnosis not present

## 2023-07-10 DIAGNOSIS — D692 Other nonthrombocytopenic purpura: Secondary | ICD-10-CM | POA: Diagnosis not present

## 2023-07-10 DIAGNOSIS — I700 Atherosclerosis of aorta: Secondary | ICD-10-CM | POA: Diagnosis not present

## 2023-07-10 DIAGNOSIS — M170 Bilateral primary osteoarthritis of knee: Secondary | ICD-10-CM | POA: Diagnosis not present

## 2023-07-10 DIAGNOSIS — I25118 Atherosclerotic heart disease of native coronary artery with other forms of angina pectoris: Secondary | ICD-10-CM | POA: Diagnosis not present

## 2023-07-10 DIAGNOSIS — D170 Benign lipomatous neoplasm of skin and subcutaneous tissue of head, face and neck: Secondary | ICD-10-CM | POA: Diagnosis not present

## 2023-07-10 DIAGNOSIS — R12 Heartburn: Secondary | ICD-10-CM | POA: Diagnosis not present

## 2023-07-10 DIAGNOSIS — I110 Hypertensive heart disease with heart failure: Secondary | ICD-10-CM | POA: Diagnosis not present

## 2023-07-10 DIAGNOSIS — D6869 Other thrombophilia: Secondary | ICD-10-CM | POA: Diagnosis not present

## 2023-07-10 DIAGNOSIS — J439 Emphysema, unspecified: Secondary | ICD-10-CM | POA: Diagnosis not present

## 2023-07-19 DIAGNOSIS — H43813 Vitreous degeneration, bilateral: Secondary | ICD-10-CM | POA: Diagnosis not present

## 2023-07-19 DIAGNOSIS — H353112 Nonexudative age-related macular degeneration, right eye, intermediate dry stage: Secondary | ICD-10-CM | POA: Diagnosis not present

## 2023-07-19 DIAGNOSIS — H353123 Nonexudative age-related macular degeneration, left eye, advanced atrophic without subfoveal involvement: Secondary | ICD-10-CM | POA: Diagnosis not present

## 2023-07-23 DIAGNOSIS — M25561 Pain in right knee: Secondary | ICD-10-CM | POA: Diagnosis not present

## 2023-07-23 DIAGNOSIS — M1711 Unilateral primary osteoarthritis, right knee: Secondary | ICD-10-CM | POA: Diagnosis not present

## 2023-07-23 DIAGNOSIS — M25661 Stiffness of right knee, not elsewhere classified: Secondary | ICD-10-CM | POA: Diagnosis not present

## 2023-07-24 NOTE — Progress Notes (Signed)
Cardiology Office Note:  .   Date:  08/07/2023  ID:  Kevin Day, DOB 08/09/1941, MRN [id number] PCP: Charlane Ferretti, DO  [facility] Providers Cardiologist:  Tonny Bollman, MD Electrophysiologist:  Regan Lemming, MD    History of Present Illness: .   Kevin Day is a 82 y.o. male  with a history of HLD, aortic atherosclerosis, dilated aortic root, HTN, atrial fibrillation s/p afib ablation with Dr Elberta Fortis on 01/06/22.moderate nonobstructive CAD, dilated aortic root, HFimpEF (45-50% most recent 55%), and hypertension.  Patient here for preop clearance for TKA.Patient denies chest pain, dyspnea, palpitations, dizziness or presyncope. Chronic ankle edema that resolves overnight. Does get extra salt at times. Patient asking if he needs to take eliquis and entresto any longer. They cost about $200 each a month.    ROS:    Studies Reviewed: Marland Kitchen    EKG Interpretation Date/Time:  Tuesday August 07 2023 07:50:22 EST Ventricular Rate:  58 PR Interval:  190 QRS Duration:  96 QT Interval:  432 QTC Calculation: 424 R Axis:   -35  Text Interpretation: Sinus bradycardia Left axis deviation Nonspecific ST abnormality When compared with ECG of 04-Apr-2023 08:31, No significant change was found Confirmed by Jacolyn Reedy [phone]) on 08/07/2023 7:55:50 AM    Prior CV Studies:   Echo 11/2021  IMPRESSIONS     1. Left ventricular ejection fraction, by estimation, is 50 to 55%. The  left ventricle has low normal function. The left ventricle has no regional  wall motion abnormalities. Left ventricular diastolic parameters are  consistent with Grade I diastolic  dysfunction (impaired relaxation).   2. Right ventricular systolic function is normal. The right ventricular  size is normal.   3. The mitral valve is normal in structure. Mild mitral valve  regurgitation.   4. The aortic valve is tricuspid. There is mild calcification of the  aortic valve. There is moderate thickening  of the aortic valve. Aortic  valve regurgitation is mild. Aortic valve sclerosis/calcification is  present, without any evidence of aortic  stenosis.   5. Aortic dilatation noted. There is mild dilatation of the aortic root,  measuring 40 mm. There is mild dilatation of the ascending aorta,  measuring 43 mm.   6. The inferior vena cava is normal in size with greater than 50%  respiratory variability, suggesting right atrial pressure of 3 mmHg.   Comparison(s): Compared to prior TTE in 04/2021, the LVEF appears mildly  improved at 50-55% (previously 45-50%). There is moderate aortic sclerosis  on current study without stenosis (suspect typo on prior study and it was  meant to say aortic sclerosis  instead of stenosis as did not appear to have AS at that time).  Echo 03/20/21 demonstrated   1. The left ventricle has normal systolic function, with an ejection  fraction of 55-60%. The cavity size was normal. Left ventricular diastolic Doppler parameters are consistent with impaired relaxation.   2. The average left ventricular global longitudinal strain is normal at  -18.8 %.   3. The right ventricle has normal systolic function. The cavity was  normal. There is no increase in right ventricular wall thickness.   4. There is mild mitral annular calcification present.   5. The aortic valve has an indeterminate number of cusps. Moderate  sclerosis of the aortic valve. Aortic valve regurgitation is mild by color  flow Doppler.   6. The aorta is abnormal in size and structure.   7. There is mild dilatation  of the aortic root and of the ascending aorta  measuring 41 mm and 42mm respectively.   CT Coronary FFR 04/2021 Normal CT FFR range is >0.80.   1. Left Main: No significant stenosis. 0.96 to 0.88 mid post lesion. Diagonal is too small to quantify. Distal LAD 0.76 small caliber vessel.   2. LAD: No significant stenosis. 3. LCX: No significant stenosis. 4. RCA: No significant stenosis.    IMPRESSION: 1.  CT FFR analysis didn't show any significant stenosis.  (LAD).   2. Recommend continued medical management - if symptoms worsen or become more worrisome, consider cardiac catheterization.   Note: These examples are not recommendations of [employer] and only provided as examples of what other customers are doing.     Electronically Signed   By: Kevin Day M.D.   On: 05/23/2021 13:59  Risk Assessment/Calculations:             Physical Exam:   VS:  BP 120/80   Pulse (!) 58   Ht 6' (1.829 m)   Wt 212 lb (96.2 kg)   SpO2 98%   BMI 28.75 kg/m    Wt Readings from Last 3 Encounters:  08/07/23 212 lb (96.2 kg)  04/04/23 210 lb 12.8 oz (95.6 kg)  10/04/22 212 lb (96.2 kg)    GEN: Well nourished, well developed in no acute distress NECK: No JVD; No carotid bruits CARDIAC:  RRR, no murmurs, rubs, gallops RESPIRATORY:  Clear to auscultation without rales, wheezing or rhonchi  ABDOMEN: Soft, non-tender, non-distended EXTREMITIES:  No edema; No deformity   ASSESSMENT AND PLAN: .    Preop clearance for TKA 08/20/23 Dr. Lequita Halt  Patient with CAD and no symptoms of angina. He is an acceptable risk for above surgery without further testing.  Requires updated BMET and CBC scheduled tomorrow before clearance can be provided. If labs are stable, patient can hold Eliquis for 3 days prior to procedure.      According to the Revised Cardiac Risk Index (RCRI), his Perioperative Risk of Major Cardiac Event is (%): 6.6  His Functional Capacity in METs is: 6.61 according to the Duke Activity Status Index (DASI).  PAF on Eliquis s/p ablation maintaining NSR, no bleeding problems. He's wondering if he can come off Eliquis. Will discuss with Dr. Elberta Fortis.  CAD coronary CTA performed in 2022 that showed moderate diffuse calcific coronary disease with borderline FFR readings. Continue medical therapy. The LAD FFR was only positive at the apex and there was some stenosis in the  diagonal but the vessel was too small to process FFR data.   HFimpEF 55% on echo 2023 on entresto and metoprolol  HLD-add FLP to tomorrow's labs. Continue crestor       Dispo: f/u with Dr. Excell Seltzer in 1 yr.  Signed, Jacolyn Reedy, PA-C

## 2023-07-30 ENCOUNTER — Other Ambulatory Visit (HOSPITAL_COMMUNITY): Payer: Self-pay | Admitting: *Deleted

## 2023-07-30 DIAGNOSIS — M1712 Unilateral primary osteoarthritis, left knee: Secondary | ICD-10-CM | POA: Diagnosis not present

## 2023-07-30 MED ORDER — APIXABAN 5 MG PO TABS: 5.0000 mg | ORAL_TABLET | Freq: Two times a day (BID) | ORAL | 1 refills | Status: DC

## 2023-07-31 NOTE — H&P (Signed)
TOTAL KNEE ADMISSION H&P  Patient is being admitted for right total knee arthroplasty.  Subjective:  Chief Complaint: Right knee pain.  HPI: Kevin Day, 82 y.o. male has a history of pain and functional disability in the right knee due to arthritis and has failed non-surgical conservative treatments for greater than 12 weeks to include NSAID's and/or analgesics, corticosteriod injections, and activity modification. Onset of symptoms was gradual, starting several years ago with gradually worsening course since that time. The patient noted no past surgery on the right knee.  Patient currently rates pain in the right knee at 8 out of 10 with activity. Patient has night pain, worsening of pain with activity and weight bearing, and pain that interferes with activities of daily living. Patient has evidence of  severe end-stage arthritis in both knees, worse on the right than the left. The right knee shows tricompartmental bone-on-bone changes  by imaging studies. There is no active infection.  Patient Active Problem List   Diagnosis Date Noted   Persistent atrial fibrillation (HCC) 12/16/2021   Paroxysmal atrial fibrillation (HCC) 04/06/2021   Hypercoagulable state due to persistent atrial fibrillation (HCC) 04/06/2021   Chest pain     Past Medical History:  Diagnosis Date   Arthritis    Cataracts, bilateral    Hyperlipidemia    Hypertension    Prostate cancer (HCC)    Prostate cancer (HCC)    Prostate cancer (HCC)    Ruptured lumbar disc    Skin cancer     Past Surgical History:  Procedure Laterality Date   ATRIAL FIBRILLATION ABLATION N/A 01/06/2022   Procedure: ATRIAL FIBRILLATION ABLATION;  Surgeon: Regan Lemming, MD;  Location: [facility] INVASIVE CV LAB;  Service: Cardiovascular;  Laterality: N/A;   BACK SURGERY     CARDIOVERSION N/A 12/01/2021   Procedure: CARDIOVERSION;  Surgeon: Sande Rives, MD;  Location: [facility] ENDOSCOPY;  Service: Cardiovascular;  Laterality: N/A;    cataract Bilateral    COLONOSCOPY  10/09/2016   POLYPECTOMY     radioactive      radioactive seed implants for prostate CA.     Prior to Admission medications   Medication Sig Start Date End Date Taking? Authorizing Provider  apixaban (ELIQUIS) 5 MG TABS tablet Take 1 tablet (5 mg total) by mouth 2 (two) times daily. 07/30/23   Fenton, Clint R, PA  Fish Oil-Cholecalciferol (OMEGA-3 FISH OIL/VITAMIN D3 PO) Take 3 tablets by mouth every morning.    [provider]  metoprolol tartrate (LOPRESSOR) 25 MG tablet Take 1 tablet (25 mg total) by mouth 2 (two) times daily. 11/21/22   Fenton, Clint R, PA  Multiple Vitamins-Minerals (OCUVITE PO) Take 1 tablet by mouth at bedtime.    [provider]  pantoprazole (PROTONIX) 20 MG tablet Take 20 mg by mouth as needed.    [provider]  potassium chloride (KLOR-CON) 10 MEQ tablet Take 2 tablets (20 mEq total) by mouth daily. 02/19/23   Fenton, Clint R, PA  rosuvastatin (CRESTOR) 20 MG tablet TAKE 1 TABLET(20 MG) BY MOUTH DAILY 05/17/23   Tonny Bollman, MD  sacubitril-valsartan (ENTRESTO) 24-26 MG TAKE 1 TABLET BY MOUTH TWICE DAILY 06/22/23   Tonny Bollman, MD  triamterene-hydrochlorothiazide (MAXZIDE-25) 37.5-25 MG tablet Take 1 tablet by mouth daily. 02/02/21   [provider]    No Known Allergies  Social History   Socioeconomic History   Marital status: Married    Spouse name: Not on file   Number of children: Not on  file   Years of education: Not on file   Highest education level: Not on file  Occupational History   Not on file  Tobacco Use   Smoking status: Former   Smokeless tobacco: Never   Tobacco comments:    Former smoker 05/10/2021  Vaping Use   Vaping status: Never Used  Substance and Sexual Activity   Alcohol use: No   Drug use: No   Sexual activity: Not on file  Other Topics Concern   Not on file  Social History Narrative   Not on file   Social Determinants of Health   Financial  Resource Strain: Not on file  Food Insecurity: Not on file  Transportation Needs: Not on file  Physical Activity: Not on file  Stress: Not on file  Social Connections: Not on file  Intimate Partner Violence: Not on file    Tobacco Use: Medium Risk (04/04/2023)   Patient History    Smoking Tobacco Use: Former    Smokeless Tobacco Use: Never    Passive Exposure: Not on file   Social History   Substance and Sexual Activity  Alcohol Use No    Family History  Problem Relation Age of Onset   Breast cancer Sister    Colon cancer Neg Hx    Esophageal cancer Neg Hx    Rectal cancer Neg Hx    Stomach cancer Neg Hx    Colon polyps Neg Hx     Review of Systems  Constitutional:  Negative for chills and fever.  HENT: Negative.    Eyes: Negative.   Respiratory:  Negative for cough and shortness of breath.   Cardiovascular:  Negative for chest pain and palpitations.  Gastrointestinal:  Negative for abdominal pain, nausea and vomiting.  Genitourinary:  Negative for dysuria, frequency and urgency.  Musculoskeletal:  Positive for joint pain.  Skin:  Negative for rash.   Objective:  Physical Exam: Well nourished and well developed.  General: Alert and oriented x3, cooperative and pleasant, no acute distress.  Head: normocephalic, atraumatic, neck supple.  Eyes: EOMI. Abdomen: non-tender to palpation and soft, normoactive bowel sounds.  Musculoskeletal: Right knee: No effusion. Range of motion is 10-100 degrees. Crepitus on range of motion. Tender medially. No lateral tenderness or instability noted.  Left knee: No effusion. Range of motion is 5-115 degrees. Some medial tenderness. No lateral tenderness or instability noted.  Calves soft and nontender. Motor function intact in LE. Strength 5/5 LE bilaterally. Neuro: Distal pulses 2+. Sensation to light touch intact in LE.  Vital signs in last 24 hours: BP: ()/()  Arterial Line BP: ()/()   Imaging Review Plain radiographs  demonstrate severe degenerative joint disease of the right knee. The overall alignment is neutral. The bone quality appears to be adequate for age and reported activity level.  Assessment/Plan:  End stage arthritis, right knee   The patient history, physical examination, clinical judgment of the provider and imaging studies are consistent with end stage degenerative joint disease of the right knee and total knee arthroplasty is deemed medically necessary. The treatment options including medical management, injection therapy arthroscopy and arthroplasty were discussed at length. The risks and benefits of total knee arthroplasty were presented and reviewed. The risks due to aseptic loosening, infection, stiffness, patella tracking problems, thromboembolic complications and other imponderables were discussed. The patient acknowledged the explanation, agreed to proceed with the plan and consent was signed. Patient is being admitted for inpatient treatment for surgery, pain control, PT, OT, prophylactic antibiotics,  VTE prophylaxis, progressive ambulation and ADLs and discharge planning. The patient is planning to be discharged  home .  Patient's anticipated LOS is less than 2 midnights, meeting these requirements: - Lives within 1 hour of care - Has a competent adult at home to recover with post-op - NO history of  - Chronic pain requiring opioids  - Diabetes  - Coronary Artery Disease  - Heart failure  - Heart attack  - Stroke  - DVT/VTE  - Respiratory Failure/COPD  - Renal failure  - Anemia  - Advanced Liver disease  Therapy Plans: EO Disposition: Home with Wife Planned DVT Prophylaxis: Eliquis (hx a fib) DME Needed: None PCP: Charlane Ferretti, MD (clearance received) Cardiologist: Tonny Bollman, MD (appt scheduled 12/10 - will be in EPIC) TXA: IV Allergies: NKDA Anesthesia Concerns: None BMI: 30.6 Last HgbA1c: not diabetic  Pharmacy: [facility] ([address])  - Patient was instructed  on what medications to stop prior to surgery. - Follow-up visit in 2 weeks with Dr. Lequita Halt - Begin physical therapy following surgery - Pre-operative lab work as pre-surgical testing - Prescriptions will be provided in hospital at time of discharge  R. Arcola Jansky, PA-C Orthopedic Surgery [facility] [place]

## 2023-08-01 ENCOUNTER — Other Ambulatory Visit: Payer: Self-pay

## 2023-08-01 ENCOUNTER — Ambulatory Visit: Payer: Medicare Other | Admitting: Dermatology

## 2023-08-01 DIAGNOSIS — Z85820 Personal history of malignant melanoma of skin: Secondary | ICD-10-CM | POA: Insufficient documentation

## 2023-08-01 DIAGNOSIS — D229 Melanocytic nevi, unspecified: Secondary | ICD-10-CM | POA: Diagnosis present

## 2023-08-01 DIAGNOSIS — L578 Other skin changes due to chronic exposure to nonionizing radiation: Secondary | ICD-10-CM | POA: Diagnosis present

## 2023-08-01 NOTE — Progress Notes (Signed)
[facility]  [address], [place], [place] [address]    Dermatology Clinic Note    CC: skin check.   ?  HPI: Celvin Para is a 82 year old male     He reports a history of a melanoma on his back. Unclear depth but per notes by Dr. Sydell Axon it seems it most likely an insitu on the R back in 2017.   ?  ROS: negative, no other skin complaints.  Feels well, no systemic complaints.   ?  Past Dermatologic hx:    SPEC #: [id number]       RECD: 06/30/22-1454  STATUS: SOUT  SP TYPE: DERM            COLL: 06/30/22-  SUB DR: Marlowe Shores MD     ENTERED:  06/30/22-1454     ORDERED:  52841 (TYPE 4), L1-L3/2, L4-L6/2, L7-L9/2     Performed at [facility], [facility]  [address] [place] [address] [phone]     >>FINAL DIAGNOSIS<<     SKIN SHAVE BIOPSY, LEFT BACK:  - Dermal nevus; present at the deep margin.  - Associated superficial dermal scar, consistent with trauma.     Note:  After review of the entire case, Kandra Nicolas MD of [facility] has  rendered the above diagnosis, with which I concur.     Diagnosis by: Jonnie Kind, MD     ?  PMH:  Patient Active Problem List:     Melanoma of skin (HCC)     Dyslipidemia     Calculus of kidney     Essential hypertension     Plantar fasciitis    ?  Meds:  ?     Medication List            Accurate as of June 30, 2022  1:02 PM. If you have any questions, ask your nurse or doctor.                CONTINUE taking these medications      albuterol HFA 108 (90 Base) MCG/ACT inhaler  Inhale 2 puffs into the lungs every 6 (six) hours as needed for Wheezing  for up to 14 days     amLODIPine 10 MG tablet  Commonly known as: NORVASC  Take 1 tablet by mouth in the morning.     amoxicillin-clavulanate 875-125 MG per tablet  Commonly known as: AUGMENTIN  Take 1 tablet by mouth in the morning and 1 tablet before bedtime. Do all this for 7 days.            ?  All:  Review of Patient's Allergies indicates:   Sulfa antibiotics        Hives, Rash?     SHx:  ?Social History    Tobacco Use      Smoking status: Never      Smokeless tobacco: Never    Alcohol use: Yes       FHx:  Hx of melanoma - , hx of NMSC -  ?  Physical exam/Assessment/Plan:  Well appearing pt in NAD  Mood and affect are wnl  A full skin examination was performed including scalp, head, eyes, ears, nose,  lips,  neck, chest, axillae, abdomen, back, buttocks, bilateral upper extremities, bilateral lower extremities, hands, feet, fingers, toes, fingernails, and toenails.   Skin type: II    # Benign nevi, scattered on trunk and extremities: benign appearing brown macules and papules,  most <9mm with no concerning features on exam or dermoscopy.   - Skin self-examination reviewed; ABCD's discussed.  - Sun protection and avoidance reviewed, including proper use of broad-spectrum UVB/UVA sunscreens with SPF 30 or greater, re-application after swimming and q 3-4 hours emphasized.?  Monitor:   - L abd now 5x52mm (prior 4x69mm) speckeld macule with reticular pattern, new photo taken  - L wrist 8x56mm tan macule with heiroglyphic pattern.   -- both unchanged.     # Dermatoheliosis of sun-exposed areas. Benign tan macules with moth-eaten borders c/w solar lentigines present.   - Skin self-examination reviewed; ABCD's discussed.   ??- Sun protection and avoidance reviewed, including proper use of broad-spectrum UVB/UVA sunscreens with SPF 30 or greater, re-application after swimming and q 3-4 hours emphasized.    # History of melanoma. Unknown depth, R back 2017.   - Well healed scar, NER.   - No cervical, axillary, supraclavicular, or inguinal lymphadenopathy appreciated on exam.   - ROS negative.   - Skin self-examination reviewed; ABCD's discussed.   ??- Sun protection and avoidance reviewed, including proper use of broad-spectrum UVB/UVA sunscreens with SPF 30 or greater, re-application after swimming and q 3-4 hours emphasized.  - There is slight linear pigmentation, I do not think this is  true recurrence but instead a lentigo growing into the scar. New photo taken.           RTC: 95mo TBSE      ?  ?  ?  Maudry Mayhew, MD  [facility]    Melanoma quality documentation:   Melanoma staging: unknown  Next screening skin examination scheduled: YES   EPIC reminder message sent: YES  Pt added to dermatology melanoma list: YES           CC: Mercer Pod, MD  [address]   [place] [address]

## 2023-08-03 NOTE — Progress Notes (Signed)
Anesthesia Review:  PCP: Cardiologist : Alphonzo Severance, PA LOV 04/04/23  Chest x-ray : EKG : 04/04/23  AFib ablaton - 01/06/22  CT CArd- 01/02/22  Echo : 12/06/21  EP Study- 01/06/22  Stress test: Cardiac Cath :  Activity level:  Sleep Study/ CPAP : Fasting Blood Sugar :      / Checks Blood Sugar -- times a day:   Blood Thinner/ Instructions /Last Dose: ASA / Instructions/ Last Dose :    Eliquis

## 2023-08-03 NOTE — Patient Instructions (Signed)
SURGICAL WAITING ROOM VISITATION  Patients having surgery or a procedure may have no more than 2 support people in the waiting area - these visitors may rotate.    Children under the age of 36 must have an adult with them who is not the patient.  Due to an increase in RSV and influenza rates and associated hospitalizations, children ages 29 and under may not visit patients in [facility] hospitals.  If the patient needs to stay at the hospital during part of their recovery, the visitor guidelines for inpatient rooms apply. Pre-op nurse will coordinate an appropriate time for 1 support person to accompany patient in pre-op.  This support person may not rotate.    Please refer to the [facility] website for the visitor guidelines for Inpatients (after your surgery is over and you are in a regular room).       Your procedure is scheduled on:  08/20/23    Report to [facility] Main Entrance    Report to admitting at  0900 AM   Call this number if you have problems the morning of surgery [phone]   Do not eat food :After Midnight.   After Midnight you may have the following liquids until _ 0830_____ AM  DAY OF SURGERY  Water Non-Citrus Juices (without pulp, NO RED-Apple, White grape, White cranberry) Black Coffee (NO MILK/CREAM OR CREAMERS, sugar ok)  Clear Tea (NO MILK/CREAM OR CREAMERS, sugar ok) regular and decaf                             Plain Jell-O (NO RED)                                           Fruit ices (not with fruit pulp, NO RED)                                     Popsicles (NO RED)                                                               Sports drinks like Gatorade (NO RED)                   The day of surgery:  Drink ONE (1) Pre-Surgery Clear Ensure or G2 at 0830 AM  ( have completed by ) the morning of surgery. Drink in one sitting. Do not sip.  This drink was given to you during your hospital  pre-op appointment visit. Nothing else to drink  after completing the  Pre-Surgery Clear Ensure or G2.          If you have questions, please contact your surgeon's office.       Oral Hygiene is also important to reduce your risk of infection.                                    Remember - BRUSH YOUR TEETH THE MORNING OF SURGERY WITH YOUR  REGULAR TOOTHPASTE  DENTURES WILL BE REMOVED PRIOR TO SURGERY PLEASE DO NOT APPLY "Poly grip" OR ADHESIVES!!!   Do NOT smoke after Midnight   Stop all vitamins and herbal supplements 7 days before surgery.   Take these medicines the morning of surgery with A SIP OF WATER:  metoprolol, protonix   DO NOT TAKE ANY ORAL DIABETIC MEDICATIONS DAY OF YOUR SURGERY  Bring CPAP mask and tubing day of surgery.                              You may not have any metal on your body including hair pins, jewelry, and body piercing             Do not wear make-up, lotions, powders, perfumes/cologne, or deodorant  Do not wear nail polish including gel and S&S, artificial/acrylic nails, or any other type of covering on natural nails including finger and toenails. If you have artificial nails, gel coating, etc. that needs to be removed by a nail salon please have this removed prior to surgery or surgery may need to be canceled/ delayed if the surgeon/ anesthesia feels like they are unable to be safely monitored.   Do not shave  48 hours prior to surgery.               Men may shave face and neck.   Do not bring valuables to the hospital. [place] IS NOT             RESPONSIBLE   FOR VALUABLES.   Contacts, glasses, dentures or bridgework may not be worn into surgery.   Bring small overnight bag day of surgery.   DO NOT BRING YOUR HOME MEDICATIONS TO THE HOSPITAL. PHARMACY WILL DISPENSE MEDICATIONS LISTED ON YOUR MEDICATION LIST TO YOU DURING YOUR ADMISSION IN THE HOSPITAL!    Patients discharged on the day of surgery will not be allowed to drive home.  Someone NEEDS to stay with you for the first 24 hours  after anesthesia.   Special Instructions: Bring a copy of your healthcare power of attorney and living will documents the day of surgery if you haven't scanned them before.              Please read over the following fact sheets you were given: IF YOU HAVE QUESTIONS ABOUT YOUR PRE-OP INSTRUCTIONS PLEASE CALL [phone]   If you received a COVID test during your pre-op visit  it is requested that you wear a mask when out in public, stay away from anyone that may not be feeling well and notify your surgeon if you develop symptoms. If you test positive for Covid or have been in contact with anyone that has tested positive in the last 10 days please notify you surgeon.      Pre-operative 5 CHG Bath Instructions   You can play a key role in reducing the risk of infection after surgery. Your skin needs to be as free of germs as possible. You can reduce the number of germs on your skin by washing with CHG (chlorhexidine gluconate) soap before surgery. CHG is an antiseptic soap that kills germs and continues to kill germs even after washing.   DO NOT use if you have an allergy to chlorhexidine/CHG or antibacterial soaps. If your skin becomes reddened or irritated, stop using the CHG and notify one of our RNs at [phone].   Please shower with the CHG soap starting  4 days before surgery using the following schedule:     Please keep in mind the following:  DO NOT shave, including legs and underarms, starting the day of your first shower.   You may shave your face at any point before/day of surgery.  Place clean sheets on your bed the day you start using CHG soap. Use a clean washcloth (not used since being washed) for each shower. DO NOT sleep with pets once you start using the CHG.   CHG Shower Instructions:  If you choose to wash your hair and private area, wash first with your normal shampoo/soap.  After you use shampoo/soap, rinse your hair and body thoroughly to remove shampoo/soap  residue.  Turn the water OFF and apply about 3 tablespoons (45 ml) of CHG soap to a CLEAN washcloth.  Apply CHG soap ONLY FROM YOUR NECK DOWN TO YOUR TOES (washing for 3-5 minutes)  DO NOT use CHG soap on face, private areas, open wounds, or sores.  Pay special attention to the area where your surgery is being performed.  If you are having back surgery, having someone wash your back for you may be helpful. Wait 2 minutes after CHG soap is applied, then you may rinse off the CHG soap.  Pat dry with a clean towel  Put on clean clothes/pajamas   If you choose to wear lotion, please use ONLY the CHG-compatible lotions on the back of this paper.     Additional instructions for the day of surgery: DO NOT APPLY any lotions, deodorants, cologne, or perfumes.   Put on clean/comfortable clothes.  Brush your teeth.  Ask your nurse before applying any prescription medications to the skin.      CHG Compatible Lotions   Aveeno Moisturizing lotion  Cetaphil Moisturizing Cream  Cetaphil Moisturizing Lotion  Clairol Herbal Essence Moisturizing Lotion, Dry Skin  Clairol Herbal Essence Moisturizing Lotion, Extra Dry Skin  Clairol Herbal Essence Moisturizing Lotion, Normal Skin  Curel Age Defying Therapeutic Moisturizing Lotion with Alpha Hydroxy  Curel Extreme Care Body Lotion  Curel Soothing Hands Moisturizing Hand Lotion  Curel Therapeutic Moisturizing Cream, Fragrance-Free  Curel Therapeutic Moisturizing Lotion, Fragrance-Free  Curel Therapeutic Moisturizing Lotion, Original Formula  Eucerin Daily Replenishing Lotion  Eucerin Dry Skin Therapy Plus Alpha Hydroxy Crme  Eucerin Dry Skin Therapy Plus Alpha Hydroxy Lotion  Eucerin Original Crme  Eucerin Original Lotion  Eucerin Plus Crme Eucerin Plus Lotion  Eucerin TriLipid Replenishing Lotion  Keri Anti-Bacterial Hand Lotion  Keri Deep Conditioning Original Lotion Dry Skin Formula Softly Scented  Keri Deep Conditioning Original Lotion,  Fragrance Free Sensitive Skin Formula  Keri Lotion Fast Absorbing Fragrance Free Sensitive Skin Formula  Keri Lotion Fast Absorbing Softly Scented Dry Skin Formula  Keri Original Lotion  Keri Skin Renewal Lotion Keri Silky Smooth Lotion  Keri Silky Smooth Sensitive Skin Lotion  Nivea Body Creamy Conditioning Oil  Nivea Body Extra Enriched Teacher, adult education Moisturizing Lotion Nivea Crme  Nivea Skin Firming Lotion  NutraDerm 30 Skin Lotion  NutraDerm Skin Lotion  NutraDerm Therapeutic Skin Cream  NutraDerm Therapeutic Skin Lotion  ProShield Protective Hand Cream  Provon moisturizing lotion

## 2023-08-07 ENCOUNTER — Encounter: Payer: Self-pay | Admitting: Physician Assistant

## 2023-08-07 ENCOUNTER — Ambulatory Visit: Payer: PPO | Attending: Physician Assistant | Admitting: Physician Assistant

## 2023-08-07 ENCOUNTER — Other Ambulatory Visit: Payer: Self-pay

## 2023-08-07 VITALS — BP 120/80 | HR 58 | Ht 72.0 in | Wt 212.0 lb

## 2023-08-07 DIAGNOSIS — E7849 Other hyperlipidemia: Secondary | ICD-10-CM | POA: Diagnosis not present

## 2023-08-07 DIAGNOSIS — I480 Paroxysmal atrial fibrillation: Secondary | ICD-10-CM | POA: Diagnosis not present

## 2023-08-07 DIAGNOSIS — Z01818 Encounter for other preprocedural examination: Secondary | ICD-10-CM | POA: Diagnosis not present

## 2023-08-07 DIAGNOSIS — I5020 Unspecified systolic (congestive) heart failure: Secondary | ICD-10-CM

## 2023-08-07 DIAGNOSIS — I25118 Atherosclerotic heart disease of native coronary artery with other forms of angina pectoris: Secondary | ICD-10-CM

## 2023-08-07 NOTE — Patient Instructions (Signed)
Medication Instructions:  No changes *If you need a refill on your cardiac medications before your next appointment, please call your pharmacy*   Lab Work: Lipid Panel If you have labs (blood work) drawn today and your tests are completely normal, you will receive your results only by: MyChart Message (if you have MyChart) OR A paper copy in the mail If you have any lab test that is abnormal or we need to change your treatment, we will call you to review the results.   Testing/Procedures: No Testing   Follow-Up: At [facility], you and your health needs are our priority.  As part of our continuing mission to provide you with exceptional heart care, we have created designated Provider Care Teams.  These Care Teams include your primary Cardiologist (physician) and Advanced Practice Providers (APPs -  Physician Assistants and Nurse Practitioners) who all work together to provide you with the care you need, when you need it.  We recommend signing up for the patient portal called "MyChart".  Sign up information is provided on this After Visit Summary.  MyChart is used to connect with patients for Virtual Visits (Telemedicine).  Patients are able to view lab/test results, encounter notes, upcoming appointments, etc.  Non-urgent messages can be sent to your provider as well.   To learn more about what you can do with MyChart, go to [email or web address].    Your next appointment:   1 year(s)  Provider:   Tonny Bollman, MD

## 2023-08-07 NOTE — Telephone Encounter (Signed)
Pt message sent through MyChart with Lyda Perone message below:  Dyann Kief, PA-C  P Cv Div Ch St Triage Can you let the patient know that Dr. Elberta Fortis recommends he stay on eliquis long term as the stroke risk is still there. Thanks, Elon Jester

## 2023-08-08 ENCOUNTER — Other Ambulatory Visit: Payer: Self-pay

## 2023-08-08 ENCOUNTER — Encounter (HOSPITAL_COMMUNITY): Payer: Self-pay

## 2023-08-08 ENCOUNTER — Encounter (HOSPITAL_COMMUNITY)
Admission: RE | Admit: 2023-08-08 | Discharge: 2023-08-08 | Disposition: A | Discharge status: Home / Self Care | Payer: PPO | Source: Ambulatory Visit | Attending: Orthopedic Surgery | Admitting: Orthopedic Surgery

## 2023-08-08 VITALS — BP 141/75 | HR 62 | Temp 97.8°F | Resp 16 | Ht 72.0 in | Wt 206.0 lb

## 2023-08-08 DIAGNOSIS — Z87891 Personal history of nicotine dependence: Secondary | ICD-10-CM | POA: Diagnosis not present

## 2023-08-08 DIAGNOSIS — I2510 Atherosclerotic heart disease of native coronary artery without angina pectoris: Secondary | ICD-10-CM | POA: Diagnosis not present

## 2023-08-08 DIAGNOSIS — I110 Hypertensive heart disease with heart failure: Secondary | ICD-10-CM | POA: Diagnosis not present

## 2023-08-08 DIAGNOSIS — Z7901 Long term (current) use of anticoagulants: Secondary | ICD-10-CM | POA: Insufficient documentation

## 2023-08-08 DIAGNOSIS — I509 Heart failure, unspecified: Secondary | ICD-10-CM | POA: Diagnosis not present

## 2023-08-08 DIAGNOSIS — I480 Paroxysmal atrial fibrillation: Secondary | ICD-10-CM | POA: Diagnosis not present

## 2023-08-08 DIAGNOSIS — M1711 Unilateral primary osteoarthritis, right knee: Secondary | ICD-10-CM | POA: Diagnosis not present

## 2023-08-08 DIAGNOSIS — Z01812 Encounter for preprocedural laboratory examination: Secondary | ICD-10-CM | POA: Insufficient documentation

## 2023-08-08 DIAGNOSIS — Z01818 Encounter for other preprocedural examination: Secondary | ICD-10-CM

## 2023-08-08 HISTORY — DX: Heart failure, unspecified: I50.9

## 2023-08-08 HISTORY — DX: Thoracic aortic ectasia: I77.810

## 2023-08-08 HISTORY — DX: Paroxysmal atrial fibrillation: I48.0

## 2023-08-08 HISTORY — DX: Atherosclerotic heart disease of native coronary artery without angina pectoris: I25.10

## 2023-08-08 HISTORY — DX: Atherosclerosis of aorta: I70.0

## 2023-08-08 HISTORY — DX: Cardiac arrhythmia, unspecified: I49.9

## 2023-08-08 LAB — CBC
HCT: 48 % (ref 39.0–52.0)
Hemoglobin: 16.4 g/dL (ref 13.0–17.0)
MCH: 32.7 pg (ref 26.0–34.0)
MCHC: 34.2 g/dL (ref 30.0–36.0)
MCV: 95.8 fL (ref 80.0–100.0)
Platelets: 144 10*3/uL — ABNORMAL LOW (ref 150–400)
RBC: 5.01 MIL/uL (ref 4.22–5.81)
RDW: 14.5 % (ref 11.5–15.5)
WBC: 7.5 10*3/uL (ref 4.0–10.5)
nRBC: 0 % (ref 0.0–0.2)

## 2023-08-08 LAB — LIPID PANEL
Cholesterol: 139 mg/dL (ref 0–200)
HDL: 46 mg/dL (ref >40)
LDL Cholesterol: 72 mg/dL (ref 0–99)
Total CHOL/HDL Ratio: 3 {ratio}
Triglycerides: 104 mg/dL (ref <150)
VLDL: 21 mg/dL (ref 0–40)

## 2023-08-08 LAB — SURGICAL PCR SCREEN
MRSA, PCR: NEGATIVE
Staphylococcus aureus: POSITIVE — AB

## 2023-08-08 LAB — BASIC METABOLIC PANEL
Anion gap: 7 (ref 5–15)
BUN: 26 mg/dL — ABNORMAL HIGH (ref 8–23)
CO2: 26 mmol/L (ref 22–32)
Calcium: 9.2 mg/dL (ref 8.9–10.3)
Chloride: 105 mmol/L (ref 98–111)
Creatinine, Ser: 1.22 mg/dL (ref 0.61–1.24)
GFR, Estimated: 59 mL/min — ABNORMAL LOW (ref >60)
Glucose, Bld: 112 mg/dL — ABNORMAL HIGH (ref 70–99)
Potassium: 4.6 mmol/L (ref 3.5–5.1)
Sodium: 138 mmol/L (ref 135–145)

## 2023-08-09 ENCOUNTER — Encounter (HOSPITAL_COMMUNITY): Payer: Self-pay

## 2023-08-09 NOTE — Anesthesia Preprocedure Evaluation (Addendum)
Anesthesia Evaluation  Patient identified by MRN, date of birth, ID band Patient awake    Reviewed: Allergy & Precautions, NPO status , Patient's Chart, lab work & pertinent test results  Airway Mallampati: II  TM Distance: >3 FB Neck ROM: Full    Dental no notable dental hx.    Pulmonary former smoker   Pulmonary exam normal        Cardiovascular hypertension, Pt. on home beta blockers and Pt. on medications + CAD  Normal cardiovascular exam+ dysrhythmias Atrial Fibrillation      Neuro/Psych negative neurological ROS  negative psych ROS   GI/Hepatic negative GI ROS, Neg liver ROS,,,  Endo/Other  negative endocrine ROS    Renal/GU negative Renal ROS     Musculoskeletal  (+) Arthritis ,    Abdominal   Peds  Hematology  (+) Blood dyscrasia (Eliquis. Last dose on 08/15/23 in the pm)   Anesthesia Other Findings right knee osteoarthritis  Reproductive/Obstetrics                             Anesthesia Physical Anesthesia Plan  ASA: 3  Anesthesia Plan: Spinal and Regional   Post-op Pain Management: Regional block*   Induction:   PONV Risk Score and Plan: 1 and Propofol infusion, Treatment may vary due to age or medical condition, Dexamethasone and Ondansetron  Airway Management Planned: Simple Face Mask  Additional Equipment:   Intra-op Plan:   Post-operative Plan:   Informed Consent: I have reviewed the patients History and Physical, chart, labs and discussed the procedure including the risks, benefits and alternatives for the proposed anesthesia with the patient or authorized representative who has indicated his/her understanding and acceptance.     Dental advisory given  Plan Discussed with: CRNA  Anesthesia Plan Comments: (PAT note from 12/11 by Sherlie Ban PA-C )        Anesthesia Quick Evaluation

## 2023-08-09 NOTE — Progress Notes (Signed)
Case: [id number] Date/Time: 08/20/23 1015   Procedure: TOTAL KNEE ARTHROPLASTY (Right: Knee)   Anesthesia type: Choice   Pre-op diagnosis: right knee osteoarthritis   Location: [facility] ROOM 10 / [facility]   Surgeons: Ollen Gross, MD       DISCUSSION: Kevin Day is an 82 yo male with hx of former smoking, HTN, moderate non-obstructive CAD, CHF, dilated aortic root, A.fib on Eliquis, hx of prostate cancer  Patient follows with Cardiology for multiple cardiac issues. His CAD and CHF has been treated with GDMT and EF has improved on most recent echo. He has hx of A.fib and had an ablation on 01/06/2022. He remains on Eliquis for stroke prevention. He was last seen in clinic on 08/07/23 for cardiac clearance. He was noted to be doing well and cleared for surgery:  "Preop clearance for TKA 08/20/23 Dr. Lequita Halt  Patient with CAD and no symptoms of angina. He is an acceptable risk for above surgery without further testing.  Requires updated BMET and CBC scheduled tomorrow before clearance can be provided. If labs are stable, patient can hold Eliquis for 3 days prior to procedure.   According to the Revised Cardiac Risk Index (RCRI), his Perioperative Risk of Major Cardiac Event is (%): 6.6   His Functional Capacity in METs is: 6.61 according to the Duke Activity Status Index (DASI)"  VS: BP (!) 141/75   Pulse 62   Temp 36.6 C (Oral)   Resp 16   Ht 6' (1.829 m)   Wt 93.4 kg   SpO2 96%   BMI 27.94 kg/m   PROVIDERS: Charlane Ferretti, DO Cardiologist:  Tonny Bollman, MD Electrophysiologist:  Will [name], MD    LABS: Labs reviewed: Acceptable for surgery. (all labs ordered are listed, but only abnormal results are displayed)  Labs Reviewed  SURGICAL PCR SCREEN - Abnormal; Notable for the following components:      Result Value   Staphylococcus aureus POSITIVE (*)    All other components within normal limits  CBC - Abnormal; Notable for the following components:   Platelets 144 (*)     All other components within normal limits  BASIC METABOLIC PANEL - Abnormal; Notable for the following components:   Glucose, Bld 112 (*)    BUN 26 (*)    GFR, Estimated 59 (*)    All other components within normal limits  LIPID PANEL     IMAGES:   EKG:   CV:  Cardiac CT 01/02/2022:  IMPRESSION: 1. There is normal pulmonary vein drainage into the left atrium.   2. The left atrial appendage is a large windsock type with two lobes and ostial size 19.6 X 23 mm and length 31 mm. There is no thrombus in the left atrial appendage.   3. The esophagus runs in the left atrial midline and is not in the proximity to any of the pulmonary veins.   4. Ascending aorta mildly dilated.  4.1 cm.   5. Aortic atherosclerosis.   6. Coronary calcium score 881 (LM 83, LAD 601, LCX 113, RCA 84.1). 67th percentile for age and gender.   Echo 12/06/2021:  IMPRESSIONS    1. Left ventricular ejection fraction, by estimation, is 50 to 55%. The left ventricle has low normal function. The left ventricle has no regional wall motion abnormalities. Left ventricular diastolic parameters are consistent with Grade I diastolic dysfunction (impaired relaxation).  2. Right ventricular systolic function is normal. The right ventricular size is normal.  3. The mitral valve  is normal in structure. Mild mitral valve regurgitation.  4. The aortic valve is tricuspid. There is mild calcification of the aortic valve. There is moderate thickening of the aortic valve. Aortic valve regurgitation is mild. Aortic valve sclerosis/calcification is present, without any evidence of aortic stenosis.  5. Aortic dilatation noted. There is mild dilatation of the aortic root, measuring 40 mm. There is mild dilatation of the ascending aorta, measuring 43 mm.  6. The inferior vena cava is normal in size with greater than 50% respiratory variability, suggesting right atrial pressure of 3 mmHg.  Past Medical History:   Diagnosis Date   Aortic atherosclerosis (HCC)    Arthritis    CAD (coronary artery disease)    Cataracts, bilateral    CHF (congestive heart failure) (HCC)    Dilated aortic root (HCC)    Dysrhythmia    Hyperlipidemia    Hypertension    PAF (paroxysmal atrial fibrillation) (HCC)    a/p ablation   Prostate cancer (HCC)    Ruptured lumbar disc    Skin cancer     Past Surgical History:  Procedure Laterality Date   ATRIAL FIBRILLATION ABLATION N/A 01/06/2022   Procedure: ATRIAL FIBRILLATION ABLATION;  Surgeon: Regan Lemming, MD;  Location: [facility]AS[facility];  Service: Cardiovascular;  Laterality: N/A;   BACK SURGERY     CARDIOVERSION N/A 12/01/2021   Procedure: CARDIOVERSION;  Surgeon: Sande Rives, MD;  Location: [facility] ENDOSCOPY;  Service: Cardiovascular;  Laterality: N/A;   cataract Bilateral    COLONOSCOPY  10/09/2016   POLYPECTOMY     radioactive      radioactive seed implants for prostate CA.     MEDICATIONS:  apixaban (ELIQUIS) 5 MG TABS tablet   metoprolol tartrate (LOPRESSOR) 25 MG tablet   Multiple Vitamins-Minerals (PRESERVISION AREDS 2) CAPS   Omega-3 Fatty Acids (OMEGA 3 500 PO)   pantoprazole (PROTONIX) 20 MG tablet   potassium chloride (KLOR-CON) 10 MEQ tablet   rosuvastatin (CRESTOR) 20 MG tablet   sacubitril-valsartan (ENTRESTO) 24-26 MG   triamterene-hydrochlorothiazide (MAXZIDE-25) 37.5-25 MG tablet   No current facility-administered medications for this encounter.   Marcille Blanco [facility] Short Stay/Anesthesiology [facility] Phone [phone] 08/09/2023 1:52 PM

## 2023-08-20 ENCOUNTER — Encounter (HOSPITAL_COMMUNITY)
Admission: RE | Disposition: A | Discharge status: Home / Self Care | Payer: Self-pay | Source: Ambulatory Visit | Attending: Orthopedic Surgery

## 2023-08-20 ENCOUNTER — Encounter (HOSPITAL_COMMUNITY): Payer: Self-pay | Admitting: Orthopedic Surgery

## 2023-08-20 ENCOUNTER — Ambulatory Visit (HOSPITAL_COMMUNITY): Payer: PPO | Admitting: Medical

## 2023-08-20 ENCOUNTER — Ambulatory Visit (HOSPITAL_COMMUNITY): Payer: PPO | Admitting: Anesthesiology

## 2023-08-20 ENCOUNTER — Other Ambulatory Visit: Payer: Self-pay

## 2023-08-20 ENCOUNTER — Observation Stay (HOSPITAL_COMMUNITY)
Admission: RE | Admit: 2023-08-20 | Discharge: 2023-08-21 | Disposition: A | Discharge status: Home / Self Care | Payer: PPO | Source: Ambulatory Visit | Attending: Orthopedic Surgery | Admitting: Orthopedic Surgery

## 2023-08-20 DIAGNOSIS — Z87891 Personal history of nicotine dependence: Secondary | ICD-10-CM | POA: Diagnosis not present

## 2023-08-20 DIAGNOSIS — Z85828 Personal history of other malignant neoplasm of skin: Secondary | ICD-10-CM | POA: Diagnosis not present

## 2023-08-20 DIAGNOSIS — I110 Hypertensive heart disease with heart failure: Secondary | ICD-10-CM | POA: Diagnosis not present

## 2023-08-20 DIAGNOSIS — M1711 Unilateral primary osteoarthritis, right knee: Secondary | ICD-10-CM | POA: Diagnosis not present

## 2023-08-20 DIAGNOSIS — Z8546 Personal history of malignant neoplasm of prostate: Secondary | ICD-10-CM | POA: Diagnosis not present

## 2023-08-20 DIAGNOSIS — I2510 Atherosclerotic heart disease of native coronary artery without angina pectoris: Secondary | ICD-10-CM

## 2023-08-20 DIAGNOSIS — I509 Heart failure, unspecified: Secondary | ICD-10-CM | POA: Insufficient documentation

## 2023-08-20 DIAGNOSIS — M179 Osteoarthritis of knee, unspecified: Principal | ICD-10-CM | POA: Diagnosis present

## 2023-08-20 DIAGNOSIS — G8918 Other acute postprocedural pain: Secondary | ICD-10-CM | POA: Diagnosis not present

## 2023-08-20 DIAGNOSIS — Z79899 Other long term (current) drug therapy: Secondary | ICD-10-CM | POA: Insufficient documentation

## 2023-08-20 DIAGNOSIS — Z7901 Long term (current) use of anticoagulants: Secondary | ICD-10-CM | POA: Diagnosis not present

## 2023-08-20 DIAGNOSIS — I480 Paroxysmal atrial fibrillation: Secondary | ICD-10-CM | POA: Insufficient documentation

## 2023-08-20 DIAGNOSIS — Z01818 Encounter for other preprocedural examination: Secondary | ICD-10-CM

## 2023-08-20 HISTORY — PX: TOTAL KNEE ARTHROPLASTY: SHX125

## 2023-08-20 SURGERY — ARTHROPLASTY, KNEE, TOTAL
Anesthesia: Regional | Site: Knee | Laterality: Right

## 2023-08-20 MED ORDER — BUPIVACAINE LIPOSOME 1.3 % IJ SUSP
INTRAMUSCULAR | Status: DC | PRN
  Administered 2023-08-20: 20 mL

## 2023-08-20 MED ORDER — ONDANSETRON HCL 4 MG PO TABS: 4.0000 mg | ORAL_TABLET | Freq: Four times a day (QID) | ORAL | Status: DC | PRN

## 2023-08-20 MED ORDER — POLYETHYLENE GLYCOL 3350 17 G PO PACK: 17.0000 g | PACK | Freq: Every day | ORAL | Status: DC | PRN

## 2023-08-20 MED ORDER — SODIUM CHLORIDE (PF) 0.9 % IJ SOLN
INTRAMUSCULAR | Status: DC | PRN
  Administered 2023-08-20: 60 mL via INTRAVENOUS

## 2023-08-20 MED ORDER — DOCUSATE SODIUM 100 MG PO CAPS
100.0000 mg | ORAL_CAPSULE | Freq: Two times a day (BID) | ORAL | Status: DC
  Administered 2023-08-20 – 2023-08-21 (×3): 100 mg via ORAL
  Filled 2023-08-20 (×3): qty 1

## 2023-08-20 MED ORDER — PHENYLEPHRINE HCL-NACL 20-0.9 MG/250ML-% IV SOLN
INTRAVENOUS | Status: DC | PRN
  Administered 2023-08-20: 30 ug/min via INTRAVENOUS

## 2023-08-20 MED ORDER — SACUBITRIL-VALSARTAN 24-26 MG PO TABS
1.0000 | ORAL_TABLET | Freq: Two times a day (BID) | ORAL | Status: DC
  Administered 2023-08-20 – 2023-08-21 (×2): 1 via ORAL
  Filled 2023-08-20 (×2): qty 1

## 2023-08-20 MED ORDER — MIDAZOLAM HCL 2 MG/2ML IJ SOLN: 2.0000 mg | INTRAMUSCULAR | Status: DC

## 2023-08-20 MED ORDER — OXYCODONE HCL 5 MG PO TABS
10.0000 mg | ORAL_TABLET | ORAL | Status: DC | PRN
Start: 2023-08-20 — End: 2023-08-21
  Administered 2023-08-20 – 2023-08-21 (×2): 10 mg via ORAL

## 2023-08-20 MED ORDER — BUPIVACAINE IN DEXTROSE 0.75-8.25 % IT SOLN
INTRATHECAL | Status: DC | PRN
  Administered 2023-08-20: 1.6 mL via INTRATHECAL

## 2023-08-20 MED ORDER — STERILE WATER FOR IRRIGATION IR SOLN
Status: DC | PRN
  Administered 2023-08-20: 1000 mL

## 2023-08-20 MED ORDER — ORAL CARE MOUTH RINSE: 15.0000 mL | Freq: Once | OROMUCOSAL | Status: AC

## 2023-08-20 MED ORDER — APIXABAN 2.5 MG PO TABS
2.5000 mg | ORAL_TABLET | Freq: Two times a day (BID) | ORAL | Status: DC
  Administered 2023-08-21: 2.5 mg via ORAL
  Filled 2023-08-20: qty 1

## 2023-08-20 MED ORDER — SODIUM CHLORIDE (PF) 0.9 % IJ SOLN
INTRAMUSCULAR | Status: AC
  Filled 2023-08-20: qty 10

## 2023-08-20 MED ORDER — CEFAZOLIN SODIUM-DEXTROSE 2-4 GM/100ML-% IV SOLN
2.0000 g | Freq: Four times a day (QID) | INTRAVENOUS | Status: AC
  Administered 2023-08-20 (×2): 2 g via INTRAVENOUS
  Filled 2023-08-20 (×2): qty 100

## 2023-08-20 MED ORDER — DIPHENHYDRAMINE HCL 12.5 MG/5ML PO ELIX
12.5000 mg | ORAL_SOLUTION | ORAL | Status: DC | PRN
Start: 2023-08-20 — End: 2023-08-21
  Administered 2023-08-20: 25 mg via ORAL
  Filled 2023-08-20: qty 10

## 2023-08-20 MED ORDER — POTASSIUM CHLORIDE ER 10 MEQ PO TBCR
20.0000 meq | EXTENDED_RELEASE_TABLET | Freq: Every day | ORAL | Status: DC
  Administered 2023-08-21: 20 meq via ORAL
  Filled 2023-08-20 (×2): qty 2

## 2023-08-20 MED ORDER — ONDANSETRON HCL 4 MG/2ML IJ SOLN
INTRAMUSCULAR | Status: DC | PRN
  Administered 2023-08-20: 4 mg via INTRAVENOUS

## 2023-08-20 MED ORDER — METHOCARBAMOL 1000 MG/10ML IJ SOLN: 500.0000 mg | Freq: Four times a day (QID) | INTRAMUSCULAR | Status: DC | PRN

## 2023-08-20 MED ORDER — LACTATED RINGERS IV SOLN
INTRAVENOUS | Status: DC
Start: 2023-08-20 — End: 2023-08-20

## 2023-08-20 MED ORDER — PHENOL 1.4 % MT LIQD: 1.0000 | OROMUCOSAL | Status: DC | PRN

## 2023-08-20 MED ORDER — FENTANYL CITRATE PF 50 MCG/ML IJ SOSY
100.0000 ug | PREFILLED_SYRINGE | INTRAMUSCULAR | Status: DC
  Administered 2023-08-20: 50 ug via INTRAVENOUS
  Filled 2023-08-20: qty 2

## 2023-08-20 MED ORDER — 0.9 % SODIUM CHLORIDE (POUR BTL) OPTIME
TOPICAL | Status: DC | PRN
  Administered 2023-08-20: 1000 mL

## 2023-08-20 MED ORDER — BUPIVACAINE LIPOSOME 1.3 % IJ SUSP
INTRAMUSCULAR | Status: AC
  Filled 2023-08-20: qty 20

## 2023-08-20 MED ORDER — FENTANYL CITRATE PF 50 MCG/ML IJ SOSY
25.0000 ug | PREFILLED_SYRINGE | INTRAMUSCULAR | Status: DC | PRN
Start: 2023-08-20 — End: 2023-08-20

## 2023-08-20 MED ORDER — AMISULPRIDE (ANTIEMETIC) 5 MG/2ML IV SOLN: 10.0000 mg | Freq: Once | INTRAVENOUS | Status: DC | PRN

## 2023-08-20 MED ORDER — TRANEXAMIC ACID-NACL 1000-0.7 MG/100ML-% IV SOLN
1000.0000 mg | INTRAVENOUS | Status: AC
  Administered 2023-08-20: 1000 mg via INTRAVENOUS
  Filled 2023-08-20: qty 100

## 2023-08-20 MED ORDER — DEXAMETHASONE SODIUM PHOSPHATE 10 MG/ML IJ SOLN
8.0000 mg | Freq: Once | INTRAMUSCULAR | Status: AC
  Administered 2023-08-20: 8 mg via INTRAVENOUS

## 2023-08-20 MED ORDER — SODIUM CHLORIDE (PF) 0.9 % IJ SOLN
INTRAMUSCULAR | Status: AC
  Filled 2023-08-20: qty 50

## 2023-08-20 MED ORDER — EPHEDRINE SULFATE-NACL 50-0.9 MG/10ML-% IV SOSY
PREFILLED_SYRINGE | INTRAVENOUS | Status: DC | PRN
  Administered 2023-08-20: 5 mg via INTRAVENOUS

## 2023-08-20 MED ORDER — ACETAMINOPHEN 500 MG PO TABS
1000.0000 mg | ORAL_TABLET | Freq: Four times a day (QID) | ORAL | Status: DC
  Administered 2023-08-20 – 2023-08-21 (×3): 1000 mg via ORAL
  Filled 2023-08-20 (×4): qty 2

## 2023-08-20 MED ORDER — OXYCODONE HCL 5 MG PO TABS
5.0000 mg | ORAL_TABLET | ORAL | Status: DC | PRN
Start: 2023-08-20 — End: 2023-08-21
  Administered 2023-08-20 – 2023-08-21 (×2): 10 mg via ORAL
  Filled 2023-08-20 (×4): qty 2

## 2023-08-20 MED ORDER — METOCLOPRAMIDE HCL 5 MG PO TABS
5.0000 mg | ORAL_TABLET | Freq: Three times a day (TID) | ORAL | Status: DC | PRN
Start: 2023-08-20 — End: 2023-08-21

## 2023-08-20 MED ORDER — SODIUM CHLORIDE 0.9 % IR SOLN
Status: DC | PRN
  Administered 2023-08-20: 1000 mL

## 2023-08-20 MED ORDER — FLEET ENEMA RE ENEM: 1.0000 | ENEMA | Freq: Once | RECTAL | Status: DC | PRN

## 2023-08-20 MED ORDER — DEXAMETHASONE SODIUM PHOSPHATE 10 MG/ML IJ SOLN
10.0000 mg | Freq: Once | INTRAMUSCULAR | Status: AC
  Administered 2023-08-21: 10 mg via INTRAVENOUS
  Filled 2023-08-20: qty 1

## 2023-08-20 MED ORDER — METHOCARBAMOL 500 MG PO TABS: 500.0000 mg | ORAL_TABLET | Freq: Four times a day (QID) | ORAL | Status: DC | PRN

## 2023-08-20 MED ORDER — POVIDONE-IODINE 10 % EX SWAB: 2.0000 | Freq: Once | CUTANEOUS | Status: DC

## 2023-08-20 MED ORDER — MENTHOL 3 MG MT LOZG: 1.0000 | LOZENGE | OROMUCOSAL | Status: DC | PRN

## 2023-08-20 MED ORDER — ROSUVASTATIN CALCIUM 20 MG PO TABS
20.0000 mg | ORAL_TABLET | Freq: Every evening | ORAL | Status: DC
  Administered 2023-08-20: 20 mg via ORAL
  Filled 2023-08-20: qty 1

## 2023-08-20 MED ORDER — BUPIVACAINE-EPINEPHRINE (PF) 0.5% -1:200000 IJ SOLN
INTRAMUSCULAR | Status: DC | PRN
  Administered 2023-08-20: 30 mL via PERINEURAL

## 2023-08-20 MED ORDER — MORPHINE SULFATE (PF) 2 MG/ML IV SOLN: 1.0000 mg | INTRAVENOUS | Status: DC | PRN

## 2023-08-20 MED ORDER — METOPROLOL TARTRATE 25 MG PO TABS
25.0000 mg | ORAL_TABLET | Freq: Once | ORAL | Status: AC
  Administered 2023-08-20: 25 mg via ORAL
  Filled 2023-08-20: qty 1

## 2023-08-20 MED ORDER — TRIAMTERENE-HCTZ 37.5-25 MG PO TABS
1.0000 | ORAL_TABLET | Freq: Every day | ORAL | Status: DC
Start: 2023-08-21 — End: 2023-08-21
  Administered 2023-08-21: 1 via ORAL
  Filled 2023-08-20: qty 1

## 2023-08-20 MED ORDER — BISACODYL 10 MG RE SUPP: 10.0000 mg | Freq: Every day | RECTAL | Status: DC | PRN

## 2023-08-20 MED ORDER — ACETAMINOPHEN 325 MG PO TABS
325.0000 mg | ORAL_TABLET | Freq: Four times a day (QID) | ORAL | Status: DC | PRN
Start: 2023-08-21 — End: 2023-08-21

## 2023-08-20 MED ORDER — CEFAZOLIN SODIUM-DEXTROSE 2-4 GM/100ML-% IV SOLN
2.0000 g | INTRAVENOUS | Status: AC
  Administered 2023-08-20: 2 g via INTRAVENOUS
  Filled 2023-08-20: qty 100

## 2023-08-20 MED ORDER — ONDANSETRON HCL 4 MG/2ML IJ SOLN: 4.0000 mg | Freq: Once | INTRAMUSCULAR | Status: DC | PRN

## 2023-08-20 MED ORDER — PROPOFOL 500 MG/50ML IV EMUL
INTRAVENOUS | Status: DC | PRN
  Administered 2023-08-20: 140 ug/kg/min via INTRAVENOUS

## 2023-08-20 MED ORDER — METOPROLOL TARTRATE 25 MG PO TABS
25.0000 mg | ORAL_TABLET | Freq: Two times a day (BID) | ORAL | Status: DC
  Administered 2023-08-21: 25 mg via ORAL
  Filled 2023-08-20: qty 1

## 2023-08-20 MED ORDER — CHLORHEXIDINE GLUCONATE 0.12 % MT SOLN
15.0000 mL | Freq: Once | OROMUCOSAL | Status: AC
  Administered 2023-08-20: 15 mL via OROMUCOSAL

## 2023-08-20 MED ORDER — SODIUM CHLORIDE 0.9 % IV SOLN: INTRAVENOUS | Status: DC

## 2023-08-20 MED ORDER — METOCLOPRAMIDE HCL 5 MG/ML IJ SOLN: 5.0000 mg | Freq: Three times a day (TID) | INTRAMUSCULAR | Status: DC | PRN

## 2023-08-20 MED ORDER — ACETAMINOPHEN 10 MG/ML IV SOLN
1000.0000 mg | Freq: Four times a day (QID) | INTRAVENOUS | Status: DC
  Administered 2023-08-20: 1000 mg via INTRAVENOUS
  Filled 2023-08-20: qty 100

## 2023-08-20 MED ORDER — TRAMADOL HCL 50 MG PO TABS
50.0000 mg | ORAL_TABLET | Freq: Four times a day (QID) | ORAL | Status: DC | PRN
  Administered 2023-08-20 – 2023-08-21 (×2): 100 mg via ORAL
  Filled 2023-08-20 (×2): qty 2

## 2023-08-20 MED ORDER — LACTATED RINGERS IV SOLN: INTRAVENOUS | Status: DC

## 2023-08-20 MED ORDER — ONDANSETRON HCL 4 MG/2ML IJ SOLN: 4.0000 mg | Freq: Four times a day (QID) | INTRAMUSCULAR | Status: DC | PRN

## 2023-08-20 MED ORDER — BUPIVACAINE LIPOSOME 1.3 % IJ SUSP: 20.0000 mL | Freq: Once | INTRAMUSCULAR | Status: DC

## 2023-08-20 SURGICAL SUPPLY — 45 items
ATTUNE MED DOME PAT 41 KNEE (Knees) IMPLANT
ATTUNE PS FEM RT SZ 8 CEM KNEE (Femur) IMPLANT
ATTUNE PSRP INSR SZ8 8 KNEE (Insert) IMPLANT
BAG COUNTER SPONGE SURGICOUNT (BAG) IMPLANT
BAG ZIPLOCK 12X15 (MISCELLANEOUS) ×2 IMPLANT
BASE TIBIAL ROT PLAT SZ 8 KNEE (Knees) IMPLANT
BLADE SAG 18X100X1.27 (BLADE) ×2 IMPLANT
BLADE SAW SGTL 11.0X1.19X90.0M (BLADE) ×2 IMPLANT
BNDG ELASTIC 6INX 5YD STR LF (GAUZE/BANDAGES/DRESSINGS) ×2 IMPLANT
BOWL SMART MIX CTS (DISPOSABLE) ×2 IMPLANT
CEMENT HV SMART SET (Cement) ×4 IMPLANT
COVER SURGICAL LIGHT HANDLE (MISCELLANEOUS) ×2 IMPLANT
CUFF TRNQT CYL 34X4.125X (TOURNIQUET CUFF) ×2 IMPLANT
DERMABOND ADVANCED .7 DNX12 (GAUZE/BANDAGES/DRESSINGS) ×2 IMPLANT
DRAPE U-SHAPE 47X51 STRL (DRAPES) ×2 IMPLANT
DRSG AQUACEL AG ADV 3.5X10 (GAUZE/BANDAGES/DRESSINGS) ×2 IMPLANT
DURAPREP 26ML APPLICATOR (WOUND CARE) ×2 IMPLANT
ELECT REM PT RETURN 15FT ADLT (MISCELLANEOUS) ×2 IMPLANT
GLOVE BIO SURGEON STRL SZ 6.5 (GLOVE) IMPLANT
GLOVE BIO SURGEON STRL SZ8 (GLOVE) ×2 IMPLANT
GLOVE BIOGEL PI IND STRL 6.5 (GLOVE) IMPLANT
GLOVE BIOGEL PI IND STRL 7.0 (GLOVE) IMPLANT
GLOVE BIOGEL PI IND STRL 8 (GLOVE) ×2 IMPLANT
GOWN STRL REUS W/ TWL LRG LVL3 (GOWN DISPOSABLE) ×2 IMPLANT
HOLDER FOLEY CATH W/STRAP (MISCELLANEOUS) IMPLANT
IMMOBILIZER KNEE 20 (SOFTGOODS) ×1
IMMOBILIZER KNEE 20 THIGH 36 (SOFTGOODS) ×2 IMPLANT
KIT TURNOVER KIT A (KITS) IMPLANT
MANIFOLD NEPTUNE II (INSTRUMENTS) ×2 IMPLANT
NS IRRIG 1000ML POUR BTL (IV SOLUTION) ×2 IMPLANT
PACK TOTAL KNEE CUSTOM (KITS) ×2 IMPLANT
PADDING CAST ABS COTTON 6X4 NS (CAST SUPPLIES) IMPLANT
PADDING CAST COTTON 6X4 STRL (CAST SUPPLIES) ×4 IMPLANT
PIN STEINMAN FIXATION KNEE (PIN) IMPLANT
PROTECTOR NERVE ULNAR (MISCELLANEOUS) ×2 IMPLANT
SET HNDPC FAN SPRY TIP SCT (DISPOSABLE) ×2 IMPLANT
SUT MNCRL AB 4-0 PS2 18 (SUTURE) ×2 IMPLANT
SUT STRATAFIX 0 PDS 27 VIOLET (SUTURE) ×1
SUT VIC AB 2-0 CT1 TAPERPNT 27 (SUTURE) ×6 IMPLANT
SUTURE STRATFX 0 PDS 27 VIOLET (SUTURE) ×2 IMPLANT
TIBIAL BASE ROT PLAT SZ 8 KNEE (Knees) ×1 IMPLANT
TRAY FOLEY MTR SLVR 16FR STAT (SET/KITS/TRAYS/PACK) IMPLANT
TUBE SUCTION HIGH CAP CLEAR NV (SUCTIONS) ×2 IMPLANT
WATER STERILE IRR 1000ML POUR (IV SOLUTION) ×4 IMPLANT
WRAP KNEE MAXI GEL POST OP (GAUZE/BANDAGES/DRESSINGS) ×2 IMPLANT

## 2023-08-20 NOTE — Transfer of Care (Signed)
Immediate Anesthesia Transfer of Care Note  Patient: Kevin Day  Procedure(s) Performed: TOTAL KNEE ARTHROPLASTY (Right: Knee)  Patient Location: PACU  Anesthesia Type:Spinal  Level of Consciousness: drowsy  Airway & Oxygen Therapy: Patient Spontanous Breathing and Patient connected to face mask  Post-op Assessment: Report given to RN and Post -op Vital signs reviewed and stable  Post vital signs: Reviewed and stable  Last Vitals:  Vitals Value Taken Time  BP 94/50 08/20/23 1115  Temp    Pulse 73 08/20/23 1116  Resp 11 08/20/23 1116  SpO2 98 % 08/20/23 1116  Vitals shown include unfiled device data.  Last Pain:  Vitals:   08/20/23 0750  TempSrc: Oral         Complications: No notable events documented.

## 2023-08-20 NOTE — Interval H&P Note (Signed)
History and Physical Interval Note:  08/20/2023 7:12 AM  Kevin Day  has presented today for surgery, with the diagnosis of right knee osteoarthritis.  The various methods of treatment have been discussed with the patient and family. After consideration of risks, benefits and other options for treatment, the patient has consented to  Procedure(s): TOTAL KNEE ARTHROPLASTY (Right) as a surgical intervention.  The patient's history has been reviewed, patient examined, no change in status, stable for surgery.  I have reviewed the patient's chart and labs.  Questions were answered to the patient's satisfaction.     Kevin Day

## 2023-08-20 NOTE — Anesthesia Procedure Notes (Addendum)
Spinal  Patient location during procedure: OR Start time: 08/20/2023 9:32 AM End time: 08/20/2023 9:37 AM Reason for block: surgical anesthesia Staffing Performed: anesthesiologist  Anesthesiologist: Leonides Grills, MD Performed by: Leonides Grills, MD Authorized by: Leonides Grills, MD   Preanesthetic Checklist Completed: patient identified, IV checked, risks and benefits discussed, surgical consent, monitors and equipment checked, pre-op evaluation and timeout performed Spinal Block Patient position: sitting Prep: DuraPrep Patient monitoring: cardiac monitor, continuous pulse ox and blood pressure Approach: left paramedian Location: L3-4 Injection technique: single-shot Needle Needle type: Pencan  Needle gauge: 24 G Needle length: 9 cm Assessment Sensory level: T10 Events: CSF return Additional Notes Functioning IV was confirmed and monitors were applied. Sterile prep and drape, including hand hygiene and sterile gloves were used. The patient was positioned and the spine was prepped. The skin was anesthetized with lidocaine.  Free flow of clear CSF was obtained on the second attempt prior to injecting local anesthetic into the CSF.  The spinal needle aspirated freely following injection.  The needle was carefully withdrawn.  The patient tolerated the procedure well.

## 2023-08-20 NOTE — Evaluation (Signed)
Physical Therapy Evaluation Patient Details Name: Kevin Day MRN: [id number] DOB: [birth year] Today's Date: 08/20/2023  History of Present Illness  82 y.o. male admitted 08/20/23 for R TKA. PMH: afib, prostate cancer, skin cancer, back surgery, cardioversion, ablation 12/2021.  Clinical Impression  Pt is s/p TKA resulting in the deficits listed below (see PT Problem List). Assisted pt from bed to recliner, BLEs buckled during transfer, suspect spinal not fully worn off despite sensation being intact to light touch BLEs. Good progress expected once spinal wears off.  Pt will benefit from acute skilled PT to increase their independence and safety with mobility to allow discharge.          If plan is discharge home, recommend the following: A little help with walking and/or transfers;A little help with bathing/dressing/bathroom;Assistance with cooking/housework;Assist for transportation;Help with stairs or ramp for entrance   Can travel by private vehicle        Equipment Recommendations None recommended by PT  Recommendations for Other Services       Functional Status Assessment Patient has had a recent decline in their functional status and demonstrates the ability to make significant improvements in function in a reasonable and predictable amount of time.     Precautions / Restrictions Precautions Precautions: Fall;Knee Restrictions Weight Bearing Restrictions Per Provider Order: No RLE Weight Bearing Per Provider Order: Weight bearing as tolerated      Mobility  Bed Mobility Overal bed mobility: Needs Assistance Bed Mobility: Supine to Sit     Supine to sit: Min assist, HOB elevated, Used rails     General bed mobility comments: min A for RLE    Transfers Overall transfer level: Needs assistance Equipment used: Rolling walker (2 wheels) Transfers: Sit to/from Stand, Bed to chair/wheelchair/BSC Sit to Stand: Min assist           General transfer comment: VCs  hand placement, min A to power up; RLE buckled in standing, so pivoted to recliner, pt also had some buckling of LLE, heavy reliance upon BUE support on RW, suspect spinal not fully worn off despite sensation being intact to light touch BLEs    Ambulation/Gait               General Gait Details: NT-buckling in standing  Stairs            Wheelchair Mobility     Tilt Bed    Modified Rankin (Stroke Patients Only)       Balance Overall balance assessment: Needs assistance   Sitting balance-Leahy Scale: Good     Standing balance support: Bilateral upper extremity supported, During functional activity, Reliant on assistive device for balance Standing balance-Leahy Scale: Poor                               Pertinent Vitals/Pain Pain Assessment Pain Assessment: No/denies pain    Home Living Family/patient expects to be discharged to:: Private residence Living Arrangements: Spouse/significant other Available Help at Discharge: Family   Home Access: Stairs to enter Entrance Stairs-Rails: None Entrance Stairs-Number of Steps: 1   Home Layout: One level Home Equipment: Agricultural consultant (2 wheels);BSC/3in1      Prior Function Prior Level of Function : Independent/Modified Independent;Driving             Mobility Comments: no falls in past 6 months, walks without AD ADLs Comments: independent     Extremity/Trunk Assessment   Upper Extremity Assessment Upper  Extremity Assessment: Overall WFL for tasks assessed    Lower Extremity Assessment Lower Extremity Assessment: RLE deficits/detail RLE Deficits / Details: knee ext 2/5 RLE Sensation: WNL RLE Coordination: decreased gross motor;decreased fine motor    Cervical / Trunk Assessment Cervical / Trunk Assessment: Normal  Communication   Communication Communication: Hearing impairment  Cognition Arousal: Alert Behavior During Therapy: WFL for tasks assessed/performed Overall Cognitive  Status: Within Functional Limits for tasks assessed                                          General Comments      Exercises Total Joint Exercises Ankle Circles/Pumps: AROM, Both, 10 reps, Supine   Assessment/Plan    PT Assessment Patient needs continued PT services  PT Problem List Decreased strength;Decreased activity tolerance;Decreased balance;Decreased mobility;Decreased knowledge of precautions       PT Treatment Interventions DME instruction;Gait training;Stair training;Therapeutic activities;Functional mobility training;Therapeutic exercise    PT Goals (Current goals can be found in the Care Plan section)  Acute Rehab PT Goals Patient Stated Goal: yardwork PT Goal Formulation: With patient/family Time For Goal Achievement: 08/27/23 Potential to Achieve Goals: Good    Frequency 7X/week     Co-evaluation               AM-PAC PT "6 Clicks" Mobility  Outcome Measure Help needed turning from your back to your side while in a flat bed without using bedrails?: A Little Help needed moving from lying on your back to sitting on the side of a flat bed without using bedrails?: A Little Help needed moving to and from a bed to a chair (including a wheelchair)?: A Little Help needed standing up from a chair using your arms (e.g., wheelchair or bedside chair)?: A Little Help needed to walk in hospital room?: Total Help needed climbing 3-5 steps with a railing? : Total 6 Click Score: 14    End of Session Equipment Utilized During Treatment: Gait belt Activity Tolerance: Patient tolerated treatment well;Other (comment) (spinal not fully worn off) Patient left: in chair;with chair alarm set;with family/visitor present;with call bell/phone within reach Nurse Communication: Mobility status PT Visit Diagnosis: Other abnormalities of gait and mobility (R26.89);Muscle weakness (generalized) (M62.81)    Time: 1610-9604 PT Time Calculation (min) (ACUTE ONLY):  24 min   Charges:   PT Evaluation $PT Eval Moderate Complexity: 1 Mod PT Treatments $Therapeutic Activity: 8-22 mins PT General Charges $$ ACUTE PT VISIT: 1 Visit         Tamala Ser PT 08/20/2023  Acute Rehabilitation Services  Office [phone]

## 2023-08-20 NOTE — Discharge Instructions (Addendum)
Gaynelle Arabian, MD Total Joint Specialist [facility] [place] [address], [address] [place], [place] [address] [phone]  TOTAL KNEE REPLACEMENT POSTOPERATIVE DIRECTIONS    Knee Rehabilitation, Guidelines Following Surgery  Results after knee surgery are often greatly improved when you follow the exercise, range of motion and muscle strengthening exercises prescribed by your doctor. Safety measures are also important to protect the knee from further injury. If any of these exercises cause you to have increased pain or swelling in your knee joint, decrease the amount until you are comfortable again and slowly increase them. If you have problems or questions, call your caregiver or physical therapist for advice.   BLOOD CLOT PREVENTION Take a 2.5 mg Eliquis twice daily for three weeks following surgery. Then take an 81 mg Aspirin once a day for three weeks. Then discontinue Aspirin. You may resume your vitamins/supplements once you have discontinued the Eliquis. Do not take any NSAIDs (Advil, Aleve, Ibuprofen, Meloxicam, etc.) until you have discontinued the Eliquis.     HOME CARE INSTRUCTIONS  Remove items at home which could result in a fall. This includes throw rugs or furniture in walking pathways.  ICE to the affected knee as much as tolerated. Icing helps control swelling. If the swelling is well controlled you will be more comfortable and rehab easier. Continue to use ice on the knee for pain and swelling from surgery. You may notice swelling that will progress down to the foot and ankle. This is normal after surgery. Elevate the leg when you are not up walking on it.    Continue to use the breathing machine which will help keep your temperature down. It is common for your temperature to cycle up and down following surgery, especially at night when you are not up moving around and exerting yourself. The breathing machine keeps your lungs expanded and your temperature  down. Do not place pillow under the operative knee, focus on keeping the knee straight while resting  DIET You may resume your previous home diet once you are discharged from the hospital.  DRESSING / WOUND CARE / SHOWERING Keep your bulky bandage on for 2 days. On the third post-operative day you may remove the Ace bandage and gauze. There is a waterproof adhesive bandage on your skin which will stay in place until your first follow-up appointment. Once you remove this you will not need to place another bandage You may begin showering 3 days following surgery, but do not submerge the incision under water.  ACTIVITY For the first 5 days, the key is rest and control of pain and swelling Do your home exercises twice a day starting on post-operative day 3. On the days you go to physical therapy, just do the home exercises once that day. You should rest, ice and elevate the leg for 50 minutes out of every hour. Get up and walk/stretch for 10 minutes per hour. After 5 days you can increase your activity slowly as tolerated. Walk with your walker as instructed. Use the walker until you are comfortable transitioning to a cane. Walk with the cane in the opposite hand of the operative leg. You may discontinue the cane once you are comfortable and walking steadily. Avoid periods of inactivity such as sitting longer than an hour when not asleep. This helps prevent blood clots.  You may discontinue the knee immobilizer once you are able to perform a straight leg raise while lying down. You may resume a sexual relationship in one month  or when given the OK by your doctor.  You may return to work once you are cleared by your doctor.  Do not drive a car for 6 weeks or until released by your surgeon.  Do not drive while taking narcotics.  TED HOSE STOCKINGS Wear the elastic stockings on both legs for three weeks following surgery during the day. You may remove them at night for sleeping.  WEIGHT  BEARING Weight bearing as tolerated with assist device (walker, cane, etc) as directed, use it as long as suggested by your surgeon or therapist, typically at least 4-6 weeks.  POSTOPERATIVE CONSTIPATION PROTOCOL Constipation - defined medically as fewer than three stools per week and severe constipation as less than one stool per week.  One of the most common issues patients have following surgery is constipation.  Even if you have a regular bowel pattern at home, your normal regimen is likely to be disrupted due to multiple reasons following surgery.  Combination of anesthesia, postoperative narcotics, change in appetite and fluid intake all can affect your bowels.  In order to avoid complications following surgery, here are some recommendations in order to help you during your recovery period.  Colace (docusate) - Pick up an over-the-counter form of Colace or another stool softener and take twice a day as long as you are requiring postoperative pain medications.  Take with a full glass of water daily.  If you experience loose stools or diarrhea, hold the colace until you stool forms back up. If your symptoms do not get better within 1 week or if they get worse, check with your doctor. Dulcolax (bisacodyl) - Pick up over-the-counter and take as directed by the product packaging as needed to assist with the movement of your bowels.  Take with a full glass of water.  Use this product as needed if not relieved by Colace only.  MiraLax (polyethylene glycol) - Pick up over-the-counter to have on hand. MiraLax is a solution that will increase the amount of water in your bowels to assist with bowel movements.  Take as directed and can mix with a glass of water, juice, soda, coffee, or tea. Take if you go more than two days without a movement. Do not use MiraLax more than once per day. Call your doctor if you are still constipated or irregular after using this medication for 7 days in a row.  If you continue  to have problems with postoperative constipation, please contact the office for further assistance and recommendations.  If you experience "the worst abdominal pain ever" or develop nausea or vomiting, please contact the office immediatly for further recommendations for treatment.  ITCHING If you experience itching with your medications, try taking only a single pain pill, or even half a pain pill at a time.  You can also use Benadryl over the counter for itching or also to help with sleep.   MEDICATIONS See your medication summary on the "After Visit Summary" that the nursing staff will review with you prior to discharge.  You may have some home medications which will be placed on hold until you complete the course of blood thinner medication.  It is important for you to complete the blood thinner medication as prescribed by your surgeon.  Continue your approved medications as instructed at time of discharge.  PRECAUTIONS If you experience chest pain or shortness of breath - call 911 immediately for transfer to the hospital emergency department.  If you develop a fever greater that 101 F,   purulent drainage from wound, increased redness or drainage from wound, foul odor from the wound/dressing, or calf pain - CONTACT YOUR SURGEON.                                                   FOLLOW-UP APPOINTMENTS Make sure you keep all of your appointments after your operation with your surgeon and caregivers. You should call the office at the above phone number and make an appointment for approximately two weeks after the date of your surgery or on the date instructed by your surgeon outlined in the "After Visit Summary".  RANGE OF MOTION AND STRENGTHENING EXERCISES  Rehabilitation of the knee is important following a knee injury or an operation. After just a few days of immobilization, the muscles of the thigh which control the knee become weakened and shrink (atrophy). Knee exercises are designed to build up  the tone and strength of the thigh muscles and to improve knee motion. Often times heat used for twenty to thirty minutes before working out will loosen up your tissues and help with improving the range of motion but do not use heat for the first two weeks following surgery. These exercises can be done on a training (exercise) mat, on the floor, on a table or on a bed. Use what ever works the best and is most comfortable for you Knee exercises include:  Leg Lifts - While your knee is still immobilized in a splint or cast, you can do straight leg raises. Lift the leg to 60 degrees, hold for 3 sec, and slowly lower the leg. Repeat 10-20 times 2-3 times daily. Perform this exercise against resistance later as your knee gets better.  Quad and Hamstring Sets - Tighten up the muscle on the front of the thigh (Quad) and hold for 5-10 sec. Repeat this 10-20 times hourly. Hamstring sets are done by pushing the foot backward against an object and holding for 5-10 sec. Repeat as with quad sets.  Leg Slides: Lying on your back, slowly slide your foot toward your buttocks, bending your knee up off the floor (only go as far as is comfortable). Then slowly slide your foot back down until your leg is flat on the floor again. Angel Wings: Lying on your back spread your legs to the side as far apart as you can without causing discomfort.  A rehabilitation program following serious knee injuries can speed recovery and prevent re-injury in the future due to weakened muscles. Contact your doctor or a physical therapist for more information on knee rehabilitation.   POST-OPERATIVE OPIOID TAPER INSTRUCTIONS: It is important to wean off of your opioid medication as soon as possible. If you do not need pain medication after your surgery it is ok to stop day one. Opioids include: Codeine, Hydrocodone(Norco, Vicodin), Oxycodone(Percocet, oxycontin) and hydromorphone amongst others.  Long term and even short term use of opiods can  cause: Increased pain response Dependence Constipation Depression Respiratory depression And more.  Withdrawal symptoms can include Flu like symptoms Nausea, vomiting And more Techniques to manage these symptoms Hydrate well Eat regular healthy meals Stay active Use relaxation techniques(deep breathing, meditating, yoga) Do Not substitute Alcohol to help with tapering If you have been on opioids for less than two weeks and do not have pain than it is ok to stop all together.  Plan   to wean off of opioids This plan should start within one week post op of your joint replacement. Maintain the same interval or time between taking each dose and first decrease the dose.  Cut the total daily intake of opioids by one tablet each day Next start to increase the time between doses. The last dose that should be eliminated is the evening dose.   IF YOU ARE TRANSFERRED TO A SKILLED REHAB FACILITY If the patient is transferred to a skilled rehab facility following release from the hospital, a list of the current medications will be sent to the facility for the patient to continue.  When discharged from the skilled rehab facility, please have the facility set up the patient's Home Health Physical Therapy prior to being released. Also, the skilled facility will be responsible for providing the patient with their medications at time of release from the facility to include their pain medication, the muscle relaxants, and their blood thinner medication. If the patient is still at the rehab facility at time of the two week follow up appointment, the skilled rehab facility will also need to assist the patient in arranging follow up appointment in our office and any transportation needs.  MAKE SURE YOU:  Understand these instructions.  Get help right away if you are not doing well or get worse.   DENTAL ANTIBIOTICS:  In most cases prophylactic antibiotics for Dental procdeures after total joint surgery are  not necessary.  Exceptions are as follows:  1. History of prior total joint infection  2. Severely immunocompromised (Organ Transplant, cancer chemotherapy, Rheumatoid biologic meds such as Humera)  3. Poorly controlled diabetes (A1C &gt; 8.0, blood glucose over 200)  If you have one of these conditions, contact your surgeon for an antibiotic prescription, prior to your dental procedure.    Pick up stool softner and laxative for home use following surgery while on pain medications. Do not submerge incision under water. Please use good hand washing techniques while changing dressing each day. May shower starting three days after surgery. Please use a clean towel to pat the incision dry following showers. Continue to use ice for pain and swelling after surgery. Do not use any lotions or creams on the incision until instructed by your surgeon.  

## 2023-08-20 NOTE — Care Plan (Signed)
Ortho Bundle Case Management Note  Patient Details  Name: Kevin Day MRN: [id number] Date of Birth: August 11, 1941  R TKA on 08-20-23 DCP:  Home with wife DME:  No needs, has a RW PT:  [facility] on 08-24-23                   DME Arranged:  N/A DME Agency:  NA  HH Arranged:  NA HH Agency:  NA  Additional Comments: Please contact me with any questions of if this plan should need to change.  Ennis Forts, RN,CCM [facility]  [phone] 08/20/2023, 2:06 PM

## 2023-08-20 NOTE — Progress Notes (Signed)
Orthopedic Tech Progress Note Patient Details:  Kevin Day [birth year] [id number]  CPM Right Knee CPM Right Knee: On Right Knee Flexion (Degrees): 40 Right Knee Extension (Degrees): 10  Post Interventions Patient Tolerated: Well  [name] 08/20/2023, 11:24 AM

## 2023-08-20 NOTE — Op Note (Signed)
OPERATIVE REPORT-TOTAL KNEE ARTHROPLASTY   Pre-operative diagnosis- Osteoarthritis  Right knee(s)  Post-operative diagnosis- Osteoarthritis Right knee(s)  Procedure-  Right  Total Knee Arthroplasty  Surgeon- Gus Rankin. Yvana Samonte, MD  Assistant- Arther Abbott, PA-C   Anesthesia-   Adductor canal block and spinal  EBL- 25 ml   Drains None  Tourniquet time- 40 minutes @ 300 mm Hg  Complications- None  Condition-PACU - hemodynamically stable.   Brief Clinical Note  Kevin Day is a 82 y.o. year old male with end stage OA of his right knee with progressively worsening pain and dysfunction. He has constant pain, with activity and at rest and significant functional deficits with difficulties even with ADLs. He has had extensive non-op management including analgesics, injections of cortisone and viscosupplements, and home exercise program, but remains in significant pain with significant dysfunction. Radiographs show bone on bone arthritis lateral and patellofemoral. He presents now for right Total Knee Arthroplasty.     Procedure in detail---   The patient is brought into the operating room and positioned supine on the operating table. After successful administration of  Adductor canal block and spinal,   a tourniquet is placed high on the  Right thigh(s) and the lower extremity is prepped and draped in the usual sterile fashion. Time out is performed by the operating team and then the  Right lower extremity is wrapped in Esmarch, knee flexed and the tourniquet inflated to 300 mmHg.       A midline incision is made with a ten blade through the subcutaneous tissue to the level of the extensor mechanism. A fresh blade is used to make a medial parapatellar arthrotomy. Soft tissue over the proximal medial tibia is subperiosteally elevated to the joint line with a knife and into the semimembranosus bursa with a Cobb elevator. Soft tissue over the proximal lateral tibia is elevated with  attention being paid to avoiding the patellar tendon on the tibial tubercle. The patella is everted, knee flexed 90 degrees and the ACL and PCL are removed. Findings are bone on bone lateral and patellofemoral with large global osteophytes        The drill is used to create a starting hole in the distal femur and the canal is thoroughly irrigated with sterile saline to remove the fatty contents. The 5 degree Right  valgus alignment guide is placed into the femoral canal and the distal femoral cutting block is pinned to remove 10 mm off the distal femur. Resection is made with an oscillating saw.      The tibia is subluxed forward and the menisci are removed. The extramedullary alignment guide is placed referencing proximally at the medial aspect of the tibial tubercle and distally along the second metatarsal axis and tibial crest. The block is pinned to remove 2mm off the more deficient lateral  side. Resection is made with an oscillating saw. Size 8is the most appropriate size for the tibia and the proximal tibia is prepared with the modular drill and keel punch for that size.      The femoral sizing guide is placed and size 8 is most appropriate. Rotation is marked off the epicondylar axis and confirmed by creating a rectangular flexion gap at 90 degrees. The size 8 cutting block is pinned in this rotation and the anterior, posterior and chamfer cuts are made with the oscillating saw. The intercondylar block is then placed and that cut is made.      Trial size 8 tibial component, trial  size 8 posterior stabilized femur and a 8  mm posterior stabilized rotating platform insert trial is placed. Full extension is achieved with excellent varus/valgus and anterior/posterior balance throughout full range of motion. The patella is everted and thickness measured to be 27  mm. Free hand resection is taken to 15 mm, a 41 template is placed, lug holes are drilled, trial patella is placed, and it tracks normally.  Osteophytes are removed off the posterior femur with the trial in place. All trials are removed and the cut bone surfaces prepared with pulsatile lavage. Cement is mixed and once ready for implantation, the size 8 tibial implant, size  8 posterior stabilized femoral component, and the size 41 patella are cemented in place and the patella is held with the clamp. The trial insert is placed and the knee held in full extension. The Exparel (20 ml mixed with 60 ml saline) is injected into the extensor mechanism, posterior capsule, medial and lateral gutters and subcutaneous tissues.  All extruded cement is removed and once the cement is hard the permanent 8 mm posterior stabilized rotating platform insert is placed into the tibial tray.      The wound is copiously irrigated with saline solution and the extensor mechanism closed with # 0 Stratofix suture. The tourniquet is released for a total tourniquet time of 40  minutes. Flexion against gravity is 140 degrees and the patella tracks normally. Subcutaneous tissue is closed with 2.0 vicryl and subcuticular with running 4.0 Monocryl. The incision is cleaned and dried and steri-strips and a bulky sterile dressing are applied. The limb is placed into a knee immobilizer and the patient is awakened and transported to recovery in stable condition.      Please note that a surgical assistant was a medical necessity for this procedure in order to perform it in a safe and expeditious manner. Surgical assistant was necessary to retract the ligaments and vital neurovascular structures to prevent injury to them and also necessary for proper positioning of the limb to allow for anatomic placement of the prosthesis.   Gus Rankin Zyheir Daft, MD    08/20/2023, 10:49 AM

## 2023-08-20 NOTE — Anesthesia Procedure Notes (Signed)
Anesthesia Regional Block: Adductor canal block   Pre-Anesthetic Checklist: , timeout performed,  Correct Patient, Correct Site, Correct Laterality,  Correct Procedure,, site marked,  Risks and benefits discussed,  Surgical consent,  Pre-op evaluation,  At surgeon's request and post-op pain management  Laterality: Right  Prep: chloraprep       Needles:  Injection technique: Single-shot  Needle Type: Echogenic Stimulator Needle     Needle Length: 10cm  Needle Gauge: 20     Additional Needles:   Procedures:,,,, ultrasound used (permanent image in chart),,    Narrative:  Start time: 08/20/2023 9:00 AM End time: 08/20/2023 9:10 AM Injection made incrementally with aspirations every 5 mL.  Performed by: Personally  Anesthesiologist: Leonides Grills, MD  Additional Notes: Functioning IV was confirmed and monitors were applied. A time-out was performed. Hand hygiene and sterile gloves were used. The thigh was placed in a frog-leg position and prepped in a sterile fashion. A 20ga Bbraun echogenic stimulator needle was placed using ultrasound guidance.  Negative aspiration and negative test dose prior to incremental administration of local anesthetic. The patient tolerated the procedure well.

## 2023-08-20 NOTE — Anesthesia Postprocedure Evaluation (Signed)
Anesthesia Post Note  Patient: Kevin Day  Procedure(s) Performed: TOTAL KNEE ARTHROPLASTY (Right: Knee)     Patient location during evaluation: PACU Anesthesia Type: Regional and Spinal Level of consciousness: awake Pain management: pain level controlled Vital Signs Assessment: post-procedure vital signs reviewed and stable Respiratory status: spontaneous breathing, nonlabored ventilation and respiratory function stable Cardiovascular status: blood pressure returned to baseline and stable Postop Assessment: no apparent nausea or vomiting Anesthetic complications: no   No notable events documented.  Last Vitals:  Vitals:   08/20/23 1245 08/20/23 1309  BP: 128/62 (!) 140/66  Pulse: (!) 50 (!) 51  Resp: 12 16  Temp:  (!) 36.4 C  SpO2: 95% 99%    Last Pain:  Vitals:   08/20/23 1309  TempSrc: Axillary  PainSc: 0-No pain                 Evia Goldsmith P Frida Wahlstrom

## 2023-08-21 ENCOUNTER — Encounter (HOSPITAL_COMMUNITY): Payer: Self-pay | Admitting: Orthopedic Surgery

## 2023-08-21 DIAGNOSIS — M1711 Unilateral primary osteoarthritis, right knee: Secondary | ICD-10-CM | POA: Diagnosis not present

## 2023-08-21 LAB — BASIC METABOLIC PANEL
Anion gap: 11 (ref 5–15)
BUN: 18 mg/dL (ref 8–23)
CO2: 22 mmol/L (ref 22–32)
Calcium: 8.4 mg/dL — ABNORMAL LOW (ref 8.9–10.3)
Chloride: 102 mmol/L (ref 98–111)
Creatinine, Ser: 0.91 mg/dL (ref 0.61–1.24)
GFR, Estimated: >60 mL/min (ref >60)
Glucose, Bld: 136 mg/dL — ABNORMAL HIGH (ref 70–99)
Potassium: 4 mmol/L (ref 3.5–5.1)
Sodium: 135 mmol/L (ref 135–145)

## 2023-08-21 LAB — CBC
HCT: 41.7 % (ref 39.0–52.0)
Hemoglobin: 14 g/dL (ref 13.0–17.0)
MCH: 32.3 pg (ref 26.0–34.0)
MCHC: 33.6 g/dL (ref 30.0–36.0)
MCV: 96.3 fL (ref 80.0–100.0)
Platelets: 121 10*3/uL — ABNORMAL LOW (ref 150–400)
RBC: 4.33 MIL/uL (ref 4.22–5.81)
RDW: 13.9 % (ref 11.5–15.5)
WBC: 14.2 10*3/uL — ABNORMAL HIGH (ref 4.0–10.5)
nRBC: 0 % (ref 0.0–0.2)

## 2023-08-21 MED ORDER — ONDANSETRON HCL 4 MG PO TABS: 4.0000 mg | ORAL_TABLET | Freq: Four times a day (QID) | ORAL | 0 refills | Status: DC | PRN

## 2023-08-21 MED ORDER — TRAMADOL HCL 50 MG PO TABS
50.0000 mg | ORAL_TABLET | Freq: Four times a day (QID) | ORAL | Status: DC | PRN
  Administered 2023-08-21: 100 mg via ORAL
  Filled 2023-08-21: qty 2

## 2023-08-21 MED ORDER — METHOCARBAMOL 500 MG PO TABS: 500.0000 mg | ORAL_TABLET | Freq: Four times a day (QID) | ORAL | 0 refills | Status: DC | PRN

## 2023-08-21 MED ORDER — MUPIROCIN 2 % EX OINT: TOPICAL_OINTMENT | Freq: Two times a day (BID) | CUTANEOUS | Status: DC

## 2023-08-21 MED ORDER — OXYCODONE HCL 5 MG PO TABS: 5.0000 mg | ORAL_TABLET | Freq: Four times a day (QID) | ORAL | 0 refills | Status: DC | PRN

## 2023-08-21 MED ORDER — TRAMADOL HCL 50 MG PO TABS: 50.0000 mg | ORAL_TABLET | Freq: Four times a day (QID) | ORAL | 0 refills | Status: DC | PRN

## 2023-08-21 NOTE — Progress Notes (Signed)
Physical Therapy Treatment Patient Details Name: Kevin Day MRN: [id number] DOB: 05/02/1941 Today's Date: 08/21/2023   History of Present Illness 82 y.o. male admitted 08/20/23 for R TKA. PMH: afib, prostate cancer, skin cancer, back surgery, cardioversion, ablation 12/2021.    PT Comments  Pt is POD # 1 and is progressing well.  He demonstrates good quad activation and pain control.  Pt was able to ambulate 80' and performed stairs similar to home set up.  Does need CGA for safety - family educated and able to provide.  Pt with good understanding of HEP and has outpt PT scheduled for Friday per family. Pt demonstrates safe gait & transfers in order to return home from PT perspective once discharged by MD.  While in hospital, will continue to benefit from PT for skilled therapy to advance mobility and exercises.       If plan is discharge home, recommend the following: A little help with walking and/or transfers;A little help with bathing/dressing/bathroom;Assistance with cooking/housework;Assist for transportation;Help with stairs or ramp for entrance   Can travel by private vehicle        Equipment Recommendations  None recommended by PT    Recommendations for Other Services       Precautions / Restrictions Precautions Precautions: Fall;Knee Restrictions RLE Weight Bearing Per Provider Order: Weight bearing as tolerated     Mobility  Bed Mobility               General bed mobility comments: in chair    Transfers Overall transfer level: Needs assistance Equipment used: Rolling walker (2 wheels) Transfers: Sit to/from Stand Sit to Stand: Contact guard assist           General transfer comment: STS x 3 during session, required CGA to steady, cues for R LE management    Ambulation/Gait Ambulation/Gait assistance: Contact guard assist Gait Distance (Feet): 80 Feet Assistive device: Rolling walker (2 wheels) Gait Pattern/deviations: Step-to pattern, Decreased  stride length, Decreased weight shift to right Gait velocity: decreased but functional     General Gait Details: antalgic but improved after ~20'; steady gait; good RW proximity   Stairs Stairs: Yes Stairs assistance: Contact guard assist Stair Management: Step to pattern, Backwards, With walker Number of Stairs: 2 General stair comments: 6" platform step x 2 with CGA; cues for sequencing and RW placement; unable to do forward but reports backward "no problem at all"; demonstrated safely , family present and recorded for future reference   Wheelchair Mobility     Tilt Bed    Modified Rankin (Stroke Patients Only)       Balance Overall balance assessment: Needs assistance Sitting-balance support: No upper extremity supported Sitting balance-Leahy Scale: Good     Standing balance support: Bilateral upper extremity supported, Reliant on assistive device for balance Standing balance-Leahy Scale: Poor Standing balance comment: Steady wtih RW but does need UE support                            Cognition Arousal: Alert Behavior During Therapy: WFL for tasks assessed/performed Overall Cognitive Status: Within Functional Limits for tasks assessed                                 General Comments: wife and dtr present        Exercises Total Joint Exercises Ankle Circles/Pumps: AROM, Both, 10 reps, Supine Quad  Sets: AROM, Both, 10 reps, Supine Heel Slides: AAROM, 10 reps, Right, Supine Hip ABduction/ADduction: AAROM, Right, 10 reps, Supine Long Arc Quad: AROM, Right, 10 reps, Seated Knee Flexion: Right, 10 reps, AAROM, Seated Goniometric ROM: R knee 10 to 60 degrees    General Comments   Educated on safe ice use, no pivots, car transfers, resting with leg straight, and TED hose during day. Also, encouraged walking every 1-2 hours during day. Educated on HEP with focus on mobility the first weeks. Discussed doing exercises within pain control and  if pain increasing could decreased ROM, reps, and stop exercises as needed. Encouraged to perform quad sets and ankle pumps frequently for blood flow and to promote full knee extension.  Pt wife and dtr present for education. Recommended CGA with mobility. Educated use of gait belt.  Pt also has difficulty with terminal knee ext.  Educated on importance of resting with leg straight and encouraged to do low load long duration ext stretch 5-10 mins 3 x day in addition to resting with leg straight.      Pertinent Vitals/Pain Pain Assessment Pain Assessment: 0-10 Pain Score: 4  Pain Location: Rknee Pain Descriptors / Indicators: Discomfort, Sore Pain Intervention(s): Limited activity within patient's tolerance, Monitored during session, Premedicated before session, Ice applied    Home Living                          Prior Function            PT Goals (current goals can now be found in the care plan section) Progress towards PT goals: Progressing toward goals    Frequency    7X/week      PT Plan      Co-evaluation              AM-PAC PT "6 Clicks" Mobility   Outcome Measure  Help needed turning from your back to your side while in a flat bed without using bedrails?: A Little Help needed moving from lying on your back to sitting on the side of a flat bed without using bedrails?: A Little Help needed moving to and from a bed to a chair (including a wheelchair)?: A Little Help needed standing up from a chair using your arms (e.g., wheelchair or bedside chair)?: A Little Help needed to walk in hospital room?: A Little Help needed climbing 3-5 steps with a railing? : A Little 6 Click Score: 18    End of Session Equipment Utilized During Treatment: Gait belt Activity Tolerance: Patient tolerated treatment well Patient left: in chair;with chair alarm set;with family/visitor present;with call bell/phone within reach Nurse Communication: Mobility status PT Visit  Diagnosis: Other abnormalities of gait and mobility (R26.89);Muscle weakness (generalized) (M62.81)     Time: 2841-3244 PT Time Calculation (min) (ACUTE ONLY): 46 min  Charges:    $Gait Training: 8-22 mins $Therapeutic Exercise: 8-22 mins $Therapeutic Activity: 8-22 mins PT General Charges $$ ACUTE PT VISIT: 1 Visit                     Anise Salvo, PT Acute Rehab [facility] [phone]    Kevin Day 08/21/2023, 12:07 PM

## 2023-08-21 NOTE — Progress Notes (Signed)
   Subjective: 1 Day Post-Op Procedure(s) (LRB): TOTAL KNEE ARTHROPLASTY (Right) Patient seen in rounds by Dr. Lequita Halt. Patient is well, and has had no acute complaints or problems. Denies SOB or chest pain. Denies calf pain. Foley cath removed this AM. Patient reports pain as moderate. Worked with physical therapy yesterday but limited as spinal had not fully worn off. We will continue physical therapy today.  Objective: Vital signs in last 24 hours: Temp:  [97.4 F (36.3 C)-98.5 F (36.9 C)] 98.4 F (36.9 C) (12/24 0535) Pulse Rate:  [50-83] 72 (12/24 0535) Resp:  [10-28] 17 (12/24 0535) BP: (94-180)/(50-89) 137/68 (12/24 0535) SpO2:  [94 %-99 %] 95 % (12/24 0535)  Intake/Output from previous day:  Intake/Output Summary (Last 24 hours) at 08/21/2023 0820 Last data filed at 08/21/2023 0600 Gross per 24 hour  Intake 1988.24 ml  Output 1595 ml  Net 393.24 ml     Intake/Output this shift: No intake/output data recorded.  Labs: Recent Labs    08/21/23 0324  HGB 14.0   Recent Labs    08/21/23 0324  WBC 14.2*  RBC 4.33  HCT 41.7  PLT 121*   Recent Labs    08/21/23 0324  NA 135  K 4.0  CL 102  CO2 22  BUN 18  CREATININE 0.91  GLUCOSE 136*  CALCIUM 8.4*   No results for input(s): "LABPT", "INR" in the last 72 hours.  Exam: General - Patient is Alert and Oriented Extremity - Neurologically intact Neurovascular intact Sensation intact distally Dorsiflexion/Plantar flexion intact Dressing - dressing C/D/I Motor Function - intact, moving foot and toes well on exam.  Past Medical History:  Diagnosis Date   Aortic atherosclerosis (HCC)    Arthritis    CAD (coronary artery disease)    Cataracts, bilateral    CHF (congestive heart failure) (HCC)    Dilated aortic root (HCC)    Dysrhythmia    Hyperlipidemia    Hypertension    PAF (paroxysmal atrial fibrillation) (HCC)    a/p ablation   Prostate cancer (HCC)    Ruptured lumbar disc    Skin cancer      Assessment/Plan: 1 Day Post-Op Procedure(s) (LRB): TOTAL KNEE ARTHROPLASTY (Right) Principal Problem:   OA (osteoarthritis) of knee Active Problems:   Primary osteoarthritis of right knee  Estimated body mass index is 27.94 kg/m as calculated from the following:   Height as of this encounter: 6' (1.829 m).   Weight as of this encounter: 93.4 kg. Advance diet Up with therapy D/C IV fluids   Patient's anticipated LOS is less than 2 midnights, meeting these requirements: - Lives within 1 hour of care - Has a competent adult at home to recover with post-op - NO history of  - Chronic pain requiring opiods  - Diabetes  - Coronary Artery Disease  - Heart failure  - Heart attack  - Stroke  - DVT/VTE  - Respiratory Failure/COPD  - Renal failure  - Anemia  - Advanced Liver disease  DVT Prophylaxis -  Eliquis Weight bearing as tolerated.  Continue physical therapy. Expected discharge home today pending progress and if meeting patient goals. Scheduled for OPP[facility] at [facility]. Follow-up in clinic in 2 weeks.  The PDMP database was reviewed today prior to any opioid medications being prescribed to this patient.  R. Arcola Jansky, PA-C Orthopedic Surgery 08/21/2023, 8:20 AM

## 2023-08-21 NOTE — Plan of Care (Signed)
  Problem: Education: Goal: Knowledge of General Education information will improve Description: Including pain rating scale, medication(s)/side effects and non-pharmacologic comfort measures Outcome: Progressing   Problem: Health Behavior/Discharge Planning: Goal: Ability to manage health-related needs will improve Outcome: Progressing   Problem: Clinical Measurements: Goal: Ability to maintain clinical measurements within normal limits will improve Outcome: Progressing Goal: Will remain free from infection Outcome: Progressing Goal: Diagnostic test results will improve Outcome: Progressing Goal: Respiratory complications will improve Outcome: Progressing Goal: Cardiovascular complication will be avoided Outcome: Progressing   Problem: Activity: Goal: Risk for activity intolerance will decrease Outcome: Progressing   Problem: Nutrition: Goal: Adequate nutrition will be maintained Outcome: Progressing   Problem: Coping: Goal: Level of anxiety will decrease Outcome: Progressing   Problem: Elimination: Goal: Will not experience complications related to bowel motility Outcome: Progressing Goal: Will not experience complications related to urinary retention Outcome: Progressing   Problem: Pain Management: Goal: General experience of comfort will improve Outcome: Progressing   Problem: Safety: Goal: Ability to remain free from injury will improve Outcome: Progressing   Problem: Skin Integrity: Goal: Risk for impaired skin integrity will decrease Outcome: Progressing   Problem: Education: Goal: Knowledge of General Education information will improve Description: Including pain rating scale, medication(s)/side effects and non-pharmacologic comfort measures Outcome: Progressing   Problem: Health Behavior/Discharge Planning: Goal: Ability to manage health-related needs will improve Outcome: Progressing   Problem: Clinical Measurements: Goal: Ability to maintain  clinical measurements within normal limits will improve Outcome: Progressing Goal: Will remain free from infection Outcome: Progressing   Problem: Nutrition: Goal: Adequate nutrition will be maintained Outcome: Completed/Met   Problem: Education: Goal: Knowledge of the prescribed therapeutic regimen will improve Outcome: Progressing Goal: Individualized Educational Video(s) Outcome: Completed/Met   Problem: Activity: Goal: Ability to avoid complications of mobility impairment will improve Outcome: Progressing Goal: Range of joint motion will improve Outcome: Progressing   Problem: Clinical Measurements: Goal: Postoperative complications will be avoided or minimized Outcome: Progressing   Problem: Pain Management: Goal: Pain level will decrease with appropriate interventions Outcome: Adequate for Discharge   Problem: Skin Integrity: Goal: Will show signs of wound healing Outcome: Progressing   Problem: Education: Goal: Knowledge of the prescribed therapeutic regimen will improve Outcome: Progressing Goal: Individualized Educational Video(s) Outcome: Completed/Met   Problem: Activity: Goal: Ability to avoid complications of mobility impairment will improve Outcome: Progressing Goal: Range of joint motion will improve Outcome: Adequate for Discharge   Problem: Clinical Measurements: Goal: Postoperative complications will be avoided or minimized Outcome: Progressing   Problem: Pain Management: Goal: Pain level will decrease with appropriate interventions Outcome: Progressing   Problem: Skin Integrity: Goal: Will show signs of wound healing Outcome: Progressing

## 2023-08-21 NOTE — Care Management Obs Status (Signed)
MEDICARE OBSERVATION STATUS NOTIFICATION   Patient Details  Name: Kevin Day MRN: [id number] Date of Birth: 1940/11/20   Medicare Observation Status Notification Given:  Yes    Halford Chessman 08/21/2023, 12:29 PM

## 2023-08-24 DIAGNOSIS — M25561 Pain in right knee: Secondary | ICD-10-CM | POA: Diagnosis not present

## 2023-08-24 DIAGNOSIS — M25661 Stiffness of right knee, not elsewhere classified: Secondary | ICD-10-CM | POA: Diagnosis not present

## 2023-08-24 NOTE — Discharge Summary (Signed)
Physician Discharge Summary   Patient ID: Kevin Day MRN: [id number] DOB/AGE: [birth year] 82 y.o.  Admit date: 08/20/2023 Discharge date: 08/21/2023  Primary Diagnosis: Osteoarthritis, right knee    Admission Diagnoses:  Past Medical History:  Diagnosis Date   Aortic atherosclerosis (HCC)    Arthritis    CAD (coronary artery disease)    Cataracts, bilateral    CHF (congestive heart failure) (HCC)    Dilated aortic root (HCC)    Dysrhythmia    Hyperlipidemia    Hypertension    PAF (paroxysmal atrial fibrillation) (HCC)    a/p ablation   Prostate cancer (HCC)    Ruptured lumbar disc    Skin cancer    Discharge Diagnoses:   Principal Problem:   OA (osteoarthritis) of knee Active Problems:   Primary osteoarthritis of right knee  Estimated body mass index is 27.94 kg/m as calculated from the following:   Height as of this encounter: 6' (1.829 m).   Weight as of this encounter: 93.4 kg.  Procedure:  Procedure(s) (LRB): TOTAL KNEE ARTHROPLASTY (Right)   Consults: None  HPI: Kevin Day is a 82 y.o. year old male with end stage OA of his right knee with progressively worsening pain and dysfunction. He has constant pain, with activity and at rest and significant functional deficits with difficulties even with ADLs. He has had extensive non-op management including analgesics, injections of cortisone and viscosupplements, and home exercise program, but remains in significant pain with significant dysfunction. Radiographs show bone on bone arthritis lateral and patellofemoral. He presents now for right Total Knee Arthroplasty   Laboratory Data: Admission on 08/20/2023, Discharged on 08/21/2023  Component Date Value Ref Range Status   WBC 08/21/2023 14.2 (H)  4.0 - 10.5 K/uL Final   RBC 08/21/2023 4.33  4.22 - 5.81 MIL/uL Final   Hemoglobin 08/21/2023 14.0  13.0 - 17.0 g/dL Final   HCT 40/98/1191 41.7  39.0 - 52.0 % Final   MCV 08/21/2023 96.3  80.0 - 100.0 fL Final    MCH 08/21/2023 32.3  26.0 - 34.0 pg Final   MCHC 08/21/2023 33.6  30.0 - 36.0 g/dL Final   RDW 47/82/9562 13.9  11.5 - 15.5 % Final   Platelets 08/21/2023 121 (L)  150 - 400 K/uL Final   nRBC 08/21/2023 0.0  0.0 - 0.2 % Final   Performed at [facility], [address], [place], [place] [address]   Sodium 08/21/2023 135  135 - 145 mmol/L Final   Potassium 08/21/2023 4.0  3.5 - 5.1 mmol/L Final   Chloride 08/21/2023 102  98 - 111 mmol/L Final   CO2 08/21/2023 22  22 - 32 mmol/L Final   Glucose, Bld 08/21/2023 136 (H)  70 - 99 mg/dL Final   Glucose reference range applies only to samples taken after fasting for at least 8 hours.   BUN 08/21/2023 18  8 - 23 mg/dL Final   Creatinine, Ser 08/21/2023 0.91  0.61 - 1.24 mg/dL Final   Calcium 57/84/6962 8.4 (L)  8.9 - 10.3 mg/dL Final   GFR, Estimated 08/21/2023 >60  >60 mL/min Final   Comment: (NOTE) Calculated using the CKD-EPI Creatinine Equation (2021)    Anion gap 08/21/2023 11  5 - 15 Final   Performed at [facility], [address], [place], [place] [address]  Hospital Outpatient Visit on 08/08/2023  Component Date Value Ref Range Status   MRSA, PCR 08/08/2023 NEGATIVE  NEGATIVE Final   Staphylococcus  aureus 08/08/2023 POSITIVE (A)  NEGATIVE Final   Comment: (NOTE) The Xpert SA Assay (FDA approved for NASAL specimens in patients 43 years of age and older), is one component of a comprehensive surveillance program. It is not intended to diagnose infection nor to guide or monitor treatment. Performed at [facility], [address], [place], [place] [address]    WBC 08/08/2023 7.5  4.0 - 10.5 K/uL Final   RBC 08/08/2023 5.01  4.22 - 5.81 MIL/uL Final   Hemoglobin 08/08/2023 16.4  13.0 - 17.0 g/dL Final   HCT 60/45/4098 48.0  39.0 - 52.0 % Final   MCV 08/08/2023 95.8  80.0 - 100.0 fL Final   MCH 08/08/2023 32.7  26.0 - 34.0 pg Final   MCHC 08/08/2023 34.2  30.0 - 36.0 g/dL  Final   RDW 11/91/4782 14.5  11.5 - 15.5 % Final   Platelets 08/08/2023 144 (L)  150 - 400 K/uL Final   nRBC 08/08/2023 0.0  0.0 - 0.2 % Final   Performed at [facility], [address] [place], [place] [address]   Sodium 08/08/2023 138  135 - 145 mmol/L Final   Potassium 08/08/2023 4.6  3.5 - 5.1 mmol/L Final   Chloride 08/08/2023 105  98 - 111 mmol/L Final   CO2 08/08/2023 26  22 - 32 mmol/L Final   Glucose, Bld 08/08/2023 112 (H)  70 - 99 mg/dL Final   Glucose reference range applies only to samples taken after fasting for at least 8 hours.   BUN 08/08/2023 26 (H)  8 - 23 mg/dL Final   Creatinine, Ser 08/08/2023 1.22  0.61 - 1.24 mg/dL Final   Calcium 30/86/5784 9.2  8.9 - 10.3 mg/dL Final   GFR, Estimated 08/08/2023 59 (L)  >60 mL/min Final   Comment: (NOTE) Calculated using the CKD-EPI Creatinine Equation (2021)    Anion gap 08/08/2023 7  5 - 15 Final   Performed at [facility], [address], [place], [place] [address]   Cholesterol 08/08/2023 139  0 - 200 mg/dL Final   Triglycerides 52/84/1324 104  <150 mg/dL Final   HDL 40/05/2724 46  >40 mg/dL Final   Total CHOL/HDL Ratio 08/08/2023 3.0  RATIO Final   VLDL 08/08/2023 21  0 - 40 mg/dL Final   LDL Cholesterol 08/08/2023 72  0 - 99 mg/dL Final   Comment:        Total Cholesterol/HDL:CHD Risk Coronary Heart Disease Risk Table                     Men   Women  1/2 Average Risk   3.4   3.3  Average Risk       5.0   4.4  2 X Average Risk   9.6   7.1  3 X Average Risk  23.4   11.0        Use the calculated Patient Ratio above and the CHD Risk Table to determine the patient's CHD Risk.        ATP III CLASSIFICATION (LDL):  <100     mg/dL   Optimal  366-440  mg/dL   Near or Above                    Optimal  130-159  mg/dL   Borderline  347-425  mg/dL   High  >956     mg/dL   Very High Performed at [facility], [address], [place], [place] [address]       X-Rays:No results found.  EKG: Orders placed or performed in visit on 08/07/23   EKG 12-Lead     Hospital Course: Kevin Day is a 82 y.o. who was admitted to [facility]. They were brought to the operating room on 08/20/2023 and underwent Procedure(s): TOTAL KNEE ARTHROPLASTY.  Patient tolerated the procedure well and was later transferred to the recovery room and then to the orthopaedic floor for postoperative care. They were given PO and IV analgesics for pain control following their surgery. They were given 24 hours of postoperative antibiotics of  Anti-infectives (From admission, onward)    Start     Dose/Rate Route Frequency Ordered Stop   08/20/23 1600  ceFAZolin (ANCEF) IVPB 2g/100 mL premix        2 g 200 mL/hr over 30 Minutes Intravenous Every 6 hours 08/20/23 1308 08/20/23 2320   08/20/23 0700  ceFAZolin (ANCEF) IVPB 2g/100 mL premix        2 g 200 mL/hr over 30 Minutes Intravenous On call to O.R. 08/20/23 [id number] 08/20/23 0948      and started on DVT prophylaxis in the form of  Eliquis .   PT and OT were ordered for total joint protocol. Discharge planning consulted to help with postop disposition and equipment needs.  Patient had a fair night on the evening of surgery. They started to get up OOB with therapy on POD #0. Pt was seen during rounds and was ready to go home pending progress with therapy. He worked with therapy on POD #1 and was meeting his goals. Pt was discharged to home later that day in stable condition.  Diet: Regular diet Activity: WBAT Follow-up: in 2 weeks Disposition: Home Discharged Condition: stable   Discharge Instructions     Call MD / Call 911   Complete by: As directed    If you experience chest pain or shortness of breath, CALL 911 and be transported to the hospital emergency room.  If you develope a fever above 101 F, pus (white drainage) or increased drainage or redness at the wound, or calf pain, call your surgeon's office.    Change dressing   Complete by: As directed    You may remove the bulky bandage (ACE wrap and gauze) two days after surgery. You will have an adhesive waterproof bandage underneath. Leave this in place until your first follow-up appointment.   Constipation Prevention   Complete by: As directed    Drink plenty of fluids.  Prune juice may be helpful.  You may use a stool softener, such as Colace (over the counter) 100 mg twice a day.  Use MiraLax (over the counter) for constipation as needed.   Diet - low sodium heart healthy   Complete by: As directed    Do not put a pillow under the knee. Place it under the heel.   Complete by: As directed    Driving restrictions   Complete by: As directed    No driving for two weeks   Post-operative opioid taper instructions:   Complete by: As directed    POST-OPERATIVE OPIOID TAPER INSTRUCTIONS: It is important to wean off of your opioid medication as soon as possible. If you do not need pain medication after your surgery it is ok to stop day one. Opioids include: Codeine, Hydrocodone(Norco, Vicodin), Oxycodone(Percocet, oxycontin) and hydromorphone amongst others.  Long term and even short term use of opiods  can cause: Increased pain response Dependence Constipation Depression Respiratory depression And more.  Withdrawal symptoms can include Flu like symptoms Nausea, vomiting And more Techniques to manage these symptoms Hydrate well Eat regular healthy meals Stay active Use relaxation techniques(deep breathing, meditating, yoga) Do Not substitute Alcohol to help with tapering If you have been on opioids for less than two weeks and do not have pain than it is ok to stop all together.  Plan to wean off of opioids This plan should start within one week post op of your joint replacement. Maintain the same interval or time between taking each dose and first decrease the dose.  Cut the total daily intake of opioids by one tablet each day Next  start to increase the time between doses. The last dose that should be eliminated is the evening dose.      TED hose   Complete by: As directed    Use stockings (TED hose) for three weeks on both leg(s).  You may remove them at night for sleeping.   Weight bearing as tolerated   Complete by: As directed       Allergies as of 08/21/2023   No Known Allergies      Medication List     TAKE these medications    apixaban 5 MG Tabs tablet Commonly known as: Eliquis Take 1 tablet (5 mg total) by mouth 2 (two) times daily.   Entresto 24-26 MG Generic drug: sacubitril-valsartan TAKE 1 TABLET BY MOUTH TWICE DAILY   methocarbamol 500 MG tablet Commonly known as: ROBAXIN Take 1 tablet (500 mg total) by mouth every 6 (six) hours as needed for muscle spasms.   metoprolol tartrate 25 MG tablet Commonly known as: LOPRESSOR Take 1 tablet (25 mg total) by mouth 2 (two) times daily.   OMEGA 3 500 PO Take by mouth.   ondansetron 4 MG tablet Commonly known as: ZOFRAN Take 1 tablet (4 mg total) by mouth every 6 (six) hours as needed for nausea.   oxyCODONE 5 MG immediate release tablet Commonly known as: Oxy IR/ROXICODONE Take 1-2 tablets (5-10 mg total) by mouth every 6 (six) hours as needed for severe pain (pain score 7-10).   pantoprazole 20 MG tablet Commonly known as: PROTONIX Take 20 mg by mouth daily.   potassium chloride 10 MEQ tablet Commonly known as: KLOR-CON Take 2 tablets (20 mEq total) by mouth daily.   PreserVision AREDS 2 Caps Take 1 capsule by mouth in the morning and at bedtime.   rosuvastatin 20 MG tablet Commonly known as: CRESTOR TAKE 1 TABLET(20 MG) BY MOUTH DAILY   traMADol 50 MG tablet Commonly known as: ULTRAM Take 1-2 tablets (50-100 mg total) by mouth every 6 (six) hours as needed for moderate pain (pain score 4-6).   triamterene-hydrochlorothiazide 37.5-25 MG tablet Commonly known as: MAXZIDE-25 Take 1 tablet by mouth daily.                Discharge Care Instructions  (From admission, onward)           Start     Ordered   08/21/23 0000  Weight bearing as tolerated        08/21/23 0823   08/21/23 0000  Change dressing       Comments: You may remove the bulky bandage (ACE wrap and gauze) two days after surgery. You will have an adhesive waterproof bandage underneath. Leave this in place until your first follow-up appointment.   08/21/23 1610  Follow-up Information     Ollen Gross, MD. Go on 09/05/2023.   Specialty: Orthopedic Surgery Why: You are scheduled for a follow up appointment on 09-05-23 at 2:15 pm. Contact information: [address] [place] [address] [phone]                 Signed: R. Arcola Jansky, PA-C Orthopedic Surgery 08/24/2023, 4:33 PM

## 2023-08-26 ENCOUNTER — Other Ambulatory Visit (HOSPITAL_BASED_OUTPATIENT_CLINIC_OR_DEPARTMENT_OTHER): Payer: Self-pay | Admitting: Cardiovascular Disease

## 2023-08-27 DIAGNOSIS — M25661 Stiffness of right knee, not elsewhere classified: Secondary | ICD-10-CM | POA: Diagnosis not present

## 2023-08-27 DIAGNOSIS — M25561 Pain in right knee: Secondary | ICD-10-CM | POA: Diagnosis not present

## 2023-08-30 DIAGNOSIS — M25661 Stiffness of right knee, not elsewhere classified: Secondary | ICD-10-CM | POA: Diagnosis not present

## 2023-08-30 DIAGNOSIS — M25561 Pain in right knee: Secondary | ICD-10-CM | POA: Diagnosis not present

## 2023-09-03 DIAGNOSIS — M25661 Stiffness of right knee, not elsewhere classified: Secondary | ICD-10-CM | POA: Diagnosis not present

## 2023-09-03 DIAGNOSIS — M25561 Pain in right knee: Secondary | ICD-10-CM | POA: Diagnosis not present

## 2023-09-05 DIAGNOSIS — M25561 Pain in right knee: Secondary | ICD-10-CM | POA: Diagnosis not present

## 2023-09-05 DIAGNOSIS — M25661 Stiffness of right knee, not elsewhere classified: Secondary | ICD-10-CM | POA: Diagnosis not present

## 2023-09-10 DIAGNOSIS — N490 Inflammatory disorders of seminal vesicle: Secondary | ICD-10-CM | POA: Diagnosis not present

## 2023-09-10 DIAGNOSIS — D6869 Other thrombophilia: Secondary | ICD-10-CM | POA: Diagnosis not present

## 2023-09-10 DIAGNOSIS — N390 Urinary tract infection, site not specified: Secondary | ICD-10-CM | POA: Diagnosis not present

## 2023-09-10 DIAGNOSIS — I4891 Unspecified atrial fibrillation: Secondary | ICD-10-CM | POA: Diagnosis not present

## 2023-09-10 DIAGNOSIS — I712 Thoracic aortic aneurysm, without rupture, unspecified: Secondary | ICD-10-CM | POA: Diagnosis not present

## 2023-09-10 DIAGNOSIS — Z8546 Personal history of malignant neoplasm of prostate: Secondary | ICD-10-CM | POA: Diagnosis not present

## 2023-09-10 DIAGNOSIS — D696 Thrombocytopenia, unspecified: Secondary | ICD-10-CM | POA: Diagnosis not present

## 2023-09-10 DIAGNOSIS — R319 Hematuria, unspecified: Secondary | ICD-10-CM | POA: Diagnosis not present

## 2023-09-10 DIAGNOSIS — M170 Bilateral primary osteoarthritis of knee: Secondary | ICD-10-CM | POA: Diagnosis not present

## 2023-09-11 ENCOUNTER — Other Ambulatory Visit (HOSPITAL_BASED_OUTPATIENT_CLINIC_OR_DEPARTMENT_OTHER): Payer: Self-pay | Admitting: Family Medicine

## 2023-09-11 DIAGNOSIS — C61 Malignant neoplasm of prostate: Secondary | ICD-10-CM | POA: Diagnosis not present

## 2023-09-11 DIAGNOSIS — R310 Gross hematuria: Secondary | ICD-10-CM | POA: Diagnosis not present

## 2023-09-11 DIAGNOSIS — N453 Epididymo-orchitis: Secondary | ICD-10-CM | POA: Diagnosis not present

## 2023-09-11 DIAGNOSIS — I10 Essential (primary) hypertension: Secondary | ICD-10-CM

## 2023-09-13 ENCOUNTER — Telehealth (HOSPITAL_COMMUNITY): Payer: Self-pay | Admitting: *Deleted

## 2023-09-13 ENCOUNTER — Ambulatory Visit (HOSPITAL_COMMUNITY): Payer: PPO | Admitting: Internal Medicine

## 2023-09-13 NOTE — Telephone Encounter (Signed)
Patient called in stating for the last 2 days he is having intermittent afib. Bp 133/73 HR 102 feeling overall ok just can feel palpitations. He is currently being treated for UTI which is when his afib started. Pt will call if HRs become uncontrolled or if afib does not improve as he continues to recover from UTI and will be seen in office at that time. Pt in agreement.

## 2023-09-17 DIAGNOSIS — M25561 Pain in right knee: Secondary | ICD-10-CM | POA: Diagnosis not present

## 2023-09-17 DIAGNOSIS — M25661 Stiffness of right knee, not elsewhere classified: Secondary | ICD-10-CM | POA: Diagnosis not present

## 2023-09-18 ENCOUNTER — Ambulatory Visit (HOSPITAL_COMMUNITY)
Admission: RE | Admit: 2023-09-18 | Discharge: 2023-09-18 | Disposition: A | Discharge status: Home / Self Care | Payer: PPO | Source: Ambulatory Visit | Attending: Internal Medicine | Admitting: Internal Medicine

## 2023-09-18 VITALS — BP 132/56 | HR 93 | Ht 72.0 in | Wt 203.4 lb

## 2023-09-18 DIAGNOSIS — E785 Hyperlipidemia, unspecified: Secondary | ICD-10-CM | POA: Insufficient documentation

## 2023-09-18 DIAGNOSIS — Z79899 Other long term (current) drug therapy: Secondary | ICD-10-CM | POA: Insufficient documentation

## 2023-09-18 DIAGNOSIS — Z7901 Long term (current) use of anticoagulants: Secondary | ICD-10-CM | POA: Insufficient documentation

## 2023-09-18 DIAGNOSIS — I4819 Other persistent atrial fibrillation: Secondary | ICD-10-CM | POA: Diagnosis not present

## 2023-09-18 DIAGNOSIS — D6869 Other thrombophilia: Secondary | ICD-10-CM | POA: Diagnosis not present

## 2023-09-18 DIAGNOSIS — I2510 Atherosclerotic heart disease of native coronary artery without angina pectoris: Secondary | ICD-10-CM | POA: Diagnosis not present

## 2023-09-18 DIAGNOSIS — I10 Essential (primary) hypertension: Secondary | ICD-10-CM | POA: Insufficient documentation

## 2023-09-18 DIAGNOSIS — I700 Atherosclerosis of aorta: Secondary | ICD-10-CM | POA: Diagnosis not present

## 2023-09-18 NOTE — Progress Notes (Signed)
Primary Care Physician: Charlane Ferretti, DO Primary Cardiologist: Dr Excell Seltzer Primary Electrophysiologist: Dr Elberta Fortis  Referring Physician: [facility]   Kevin Day is a 83 y.o. male with a history of HLD, aortic atherosclerosis, dilated aortic root, HTN, atrial fibrillation who presents for follow up in the [facility].  The patient was initially diagnosed with atrial fibrillation 04/01/21 after presenting to the ED with symptoms of chest discomfort and SOB. ECG showed rate controlled afib. His K+ was 2.9. he was started on Eliquis for a CHADS2VASC score of 4, metoprolol, and KCL. Patient noticed by his apple watch that he went into afib Monday 11/14/21. He contacted Dr. Earmon Phoenix office on that day and metoprolol was increased to 25 mg bid. He said if not for his apple watch, he would not have paid much attention to his heart rhythm. Afib  has occurred on the tail end  of 2 steroid tapers for neck pain, which is much better. Patient is s/p DCCV 12/01/21. He noted on his smart watch that he was back in afib on 12/13/21.   Patient is s/p afib ablation with Dr Elberta Fortis on 01/06/22.   On follow up today, patient reports that he has done well since his last visit. He has not had any interim symptoms of afib. No episodes detected on his smart watch. No bleeding issues on anticoagulation.   On follow up 09/18/23, he is currently in NSR. Patient called office on 1/16 noting he has had intermittent Afib for the past 2 days and currently being treated for a UTI. S/p total right knee replacement on 08/20/23. He resumed Eliquis 5 mg BID the day after his knee replacement. He took prednisone for 7 days following procedure due to back rash likely secondary to soap given for cleaning prior to surgery. He developed a UTI following prednisone and is currently taking cefdinir for this. He has noticed improvement in his knee pain now that he has continued with therapy and has noticed improvement  with his UTI symptoms. He had Afib 1 week ago and monitors via Apple watch.  Today, he denies symptoms of palpitations, shortness of breath, orthopnea, PND, lower extremity edema, presyncope, syncope, snoring, daytime somnolence, bleeding, or neurologic sequela. The patient is tolerating medications without difficulties and is otherwise without complaint today.    Atrial Fibrillation Risk Factors:  he does not have symptoms or diagnosis of sleep apnea. he does not have a history of rheumatic fever. he does not have a history of alcohol use.   Atrial Fibrillation Management history:  Previous antiarrhythmic drugs: none Previous cardioversions: 12/01/21 Previous ablations: 01/06/22 Anticoagulation history: Eliquis   Past Medical History:  Diagnosis Date   Aortic atherosclerosis (HCC)    Arthritis    CAD (coronary artery disease)    Cataracts, bilateral    CHF (congestive heart failure) (HCC)    Dilated aortic root (HCC)    Dysrhythmia    Hyperlipidemia    Hypertension    PAF (paroxysmal atrial fibrillation) (HCC)    a/p ablation   Prostate cancer (HCC)    Ruptured lumbar disc    Skin cancer     Current Outpatient Medications  Medication Sig Dispense Refill   apixaban (ELIQUIS) 5 MG TABS tablet Take 1 tablet (5 mg total) by mouth 2 (two) times daily. 180 tablet 1   cefdinir (OMNICEF) 300 MG capsule Take 300 mg by mouth 2 (two) times daily.     Glucosamine HCl (GLUCOSAMINE PO) Take  by mouth. Taking 2 tablets by mouth in the am and 1 tablet at bedtime 1500 mg     methocarbamol (ROBAXIN) 500 MG tablet Take 1 tablet (500 mg total) by mouth every 6 (six) hours as needed for muscle spasms. 40 tablet 0   metoprolol tartrate (LOPRESSOR) 25 MG tablet Take 1 tablet (25 mg total) by mouth 2 (two) times daily. 180 tablet 2   Multiple Vitamins-Minerals (PRESERVISION AREDS 2) CAPS Take 1 capsule by mouth every morning.     pantoprazole (PROTONIX) 20 MG tablet Take 20 mg by mouth daily.      potassium chloride (KLOR-CON) 10 MEQ tablet Take 2 tablets (20 mEq total) by mouth daily. 60 tablet 6   rosuvastatin (CRESTOR) 20 MG tablet TAKE 1 TABLET(20 MG) BY MOUTH DAILY 90 tablet 3   sacubitril-valsartan (ENTRESTO) 24-26 MG TAKE 1 TABLET BY MOUTH TWICE DAILY 180 tablet 0   No current facility-administered medications for this encounter.    ROS- All systems are reviewed and negative except as per the HPI above.  Physical Exam: Vitals:   09/18/23 1317  BP: (!) 132/56  Pulse: 93  Weight: 92.3 kg  Height: 6' (1.829 m)    GEN- The patient is well appearing, alert and oriented x 3 today.   Neck - no JVD or carotid bruit noted Lungs- Clear to ausculation bilaterally, normal work of breathing Heart- Regular rate and rhythm, no murmurs, rubs or gallops, PMI not laterally displaced Extremities- no clubbing, cyanosis, or edema Skin - no rash or ecchymosis noted   Wt Readings from Last 3 Encounters:  09/18/23 92.3 kg  08/20/23 93.4 kg  08/08/23 93.4 kg    EKG today demonstrates  Vent. rate 93 BPM PR interval 160 ms QRS duration 92 ms QT/QTcB 360/447 ms P-R-T axes 50 -30 49 Sinus rhythm with Premature atrial complexes Left axis deviation Abnormal ECG When compared with ECG of 07-Aug-2023 07:50, PREVIOUS ECG IS PRESENT   Echo 12/06/21: 1. Left ventricular ejection fraction, by estimation, is 50 to 55%. The  left ventricle has low normal function. The left ventricle has no regional  wall motion abnormalities. Left ventricular diastolic parameters are  consistent with Grade I diastolic  dysfunction (impaired relaxation).   2. Right ventricular systolic function is normal. The right ventricular  size is normal.   3. The mitral valve is normal in structure. Mild mitral valve  regurgitation.   4. The aortic valve is tricuspid. There is mild calcification of the  aortic valve. There is moderate thickening of the aortic valve. Aortic  valve regurgitation is mild. Aortic  valve sclerosis/calcification is  present, without any evidence of aortic  stenosis.   5. Aortic dilatation noted. There is mild dilatation of the aortic root,  measuring 40 mm. There is mild dilatation of the ascending aorta,  measuring 43 mm.   6. The inferior vena cava is normal in size with greater than 50%  respiratory variability, suggesting right atrial pressure of 3 mmHg.    Epic records are reviewed at length today  CHA2DS2-VASc Score = 4  The patient's score is based upon: CHF History: 0 HTN History: 1 Diabetes History: 0 Stroke History: 0 Vascular Disease History: 1 Age Score: 2 Gender Score: 0      ASSESSMENT AND PLAN: Persistent atrial fibrillation  The patient's CHA2DS2-VASc score is 4, indicating a 4.8% annual risk of stroke.   S/p afib ablation 01/06/22  He is currently in NSR. We discussed that given current situation  of recent knee surgery, prednisone, and UTI he may have had contributing factors towards recent Afib episode. Overall, patient notes no Afib prior to this episode (none since ablation in 2023). After discussion, we will continue with conservative observation. Advised patient to contact office for follow up if Apple watch notes increased burden.  Continue Lopressor 25 mg BID. Continue Eliquis 5 mg BID without interruption.  Secondary Hypercoagulable State (ICD10:  D68.69) The patient is at significant risk for stroke/thromboembolism based upon his CHA2DS2-VASc Score of 4.  Continue Apixaban (Eliquis).  No missed doses following medication continuation after knee surgery.   HTN Stable today no changes.  CAD No chest pain with Afib noted.    Follow up Afib clinic prn.   Justin Mend, PA-C Afib Clinic [facility] [address] [place], [place] [address] [phone]

## 2023-09-19 DIAGNOSIS — M25561 Pain in right knee: Secondary | ICD-10-CM | POA: Diagnosis not present

## 2023-09-19 DIAGNOSIS — M25661 Stiffness of right knee, not elsewhere classified: Secondary | ICD-10-CM | POA: Diagnosis not present

## 2023-09-20 ENCOUNTER — Other Ambulatory Visit (HOSPITAL_BASED_OUTPATIENT_CLINIC_OR_DEPARTMENT_OTHER): Payer: Self-pay | Admitting: Family Medicine

## 2023-09-20 DIAGNOSIS — Z7184 Encounter for health counseling related to travel: Secondary | ICD-10-CM

## 2023-09-20 MED ORDER — AZITHROMYCIN 250 MG PO TABS
ORAL_TABLET | ORAL | 0 refills | Status: AC
Start: 2023-09-20 — End: 2023-10-17

## 2023-09-23 ENCOUNTER — Other Ambulatory Visit (HOSPITAL_COMMUNITY): Payer: Self-pay | Admitting: Physician Assistant

## 2023-09-23 ENCOUNTER — Other Ambulatory Visit: Payer: Self-pay | Admitting: Cardiovascular Disease

## 2023-09-23 DIAGNOSIS — I480 Paroxysmal atrial fibrillation: Secondary | ICD-10-CM

## 2023-09-23 DIAGNOSIS — I428 Other cardiomyopathies: Secondary | ICD-10-CM

## 2023-09-24 ENCOUNTER — Other Ambulatory Visit (HOSPITAL_COMMUNITY): Payer: Self-pay | Admitting: Physician Assistant

## 2023-09-24 DIAGNOSIS — M25661 Stiffness of right knee, not elsewhere classified: Secondary | ICD-10-CM | POA: Diagnosis not present

## 2023-09-24 DIAGNOSIS — M25561 Pain in right knee: Secondary | ICD-10-CM | POA: Diagnosis not present

## 2023-09-25 ENCOUNTER — Encounter (HOSPITAL_BASED_OUTPATIENT_CLINIC_OR_DEPARTMENT_OTHER): Payer: Self-pay | Admitting: Family Medicine

## 2023-09-25 DIAGNOSIS — Z5189 Encounter for other specified aftercare: Secondary | ICD-10-CM | POA: Diagnosis not present

## 2023-09-25 DIAGNOSIS — I10 Essential (primary) hypertension: Secondary | ICD-10-CM

## 2023-09-25 MED ORDER — AMLODIPINE BESYLATE 10 MG PO TABS
10.0000 mg | ORAL_TABLET | Freq: Every day | ORAL | 0 refills | Status: DC
Start: 2023-09-25 — End: 2024-01-10

## 2023-09-25 NOTE — Telephone Encounter (Signed)
PER Patient (self), Eric Kemp is a 83 year old male has requested a refill of amlodipine.    This refill request failed protocol because     Calcium-Channel Blockers Protocol Failed01/28/2025 07:43 AM   Protocol Details Visit with relevant provider in past 12 months          PCP team Provider appointment needed in the next 90 days for refill of amlodipine. Central refill to call patient to schedule, 90 day refill has been approved.       Last Office Visit: 06/23/22 with Wayne Sever  Last Physical Exam: 10/10/21    There are no preventive care reminders to display for this patient.    Other Med Adult:  Most Recent BP Reading(s)  11/23/22 : 129/68        Cholesterol (mg/dL)   Date Value   45/40/9811 236     LOW DENSITY LIPOPROTEIN DIRECT (mg/dL)   Date Value   91/47/8295 141     HIGH DENSITY LIPOPROTEIN (mg/dL)   Date Value   62/13/0865 92     TRIGLYCERIDES (mg/dL)   Date Value   78/46/9629 94         No results found for: "TSHSC"      No results found for: "TSH"    HEMOGLOBIN A1C (%)   Date Value   06/23/2022 5.5       No results found for: "POCA1C"      No results found for: "INR"    SODIUM (mmol/L)   Date Value   06/23/2022 136       POTASSIUM (mmol/L)   Date Value   06/23/2022 3.9           CREATININE (mg/dL)   Date Value   52/84/1324 1.2       Documented patient preferred pharmacies:    [facility] - [place], [place] - [address]  Phone: [phone] Fax: [phone]

## 2023-09-26 DIAGNOSIS — M25661 Stiffness of right knee, not elsewhere classified: Secondary | ICD-10-CM | POA: Diagnosis not present

## 2023-09-26 DIAGNOSIS — N281 Cyst of kidney, acquired: Secondary | ICD-10-CM | POA: Diagnosis not present

## 2023-09-26 DIAGNOSIS — K5730 Diverticulosis of large intestine without perforation or abscess without bleeding: Secondary | ICD-10-CM | POA: Diagnosis not present

## 2023-09-26 DIAGNOSIS — R310 Gross hematuria: Secondary | ICD-10-CM | POA: Diagnosis not present

## 2023-09-26 DIAGNOSIS — M25561 Pain in right knee: Secondary | ICD-10-CM | POA: Diagnosis not present

## 2023-09-28 DIAGNOSIS — M25661 Stiffness of right knee, not elsewhere classified: Secondary | ICD-10-CM | POA: Diagnosis not present

## 2023-09-28 DIAGNOSIS — M25561 Pain in right knee: Secondary | ICD-10-CM | POA: Diagnosis not present

## 2023-10-02 DIAGNOSIS — Z5189 Encounter for other specified aftercare: Secondary | ICD-10-CM | POA: Diagnosis not present

## 2023-10-02 DIAGNOSIS — M25461 Effusion, right knee: Secondary | ICD-10-CM | POA: Diagnosis not present

## 2023-10-03 DIAGNOSIS — M25661 Stiffness of right knee, not elsewhere classified: Secondary | ICD-10-CM | POA: Diagnosis not present

## 2023-10-03 DIAGNOSIS — M25561 Pain in right knee: Secondary | ICD-10-CM | POA: Diagnosis not present

## 2023-10-05 DIAGNOSIS — M25561 Pain in right knee: Secondary | ICD-10-CM | POA: Diagnosis not present

## 2023-10-05 DIAGNOSIS — M25661 Stiffness of right knee, not elsewhere classified: Secondary | ICD-10-CM | POA: Diagnosis not present

## 2023-10-08 DIAGNOSIS — M25561 Pain in right knee: Secondary | ICD-10-CM | POA: Diagnosis not present

## 2023-10-08 DIAGNOSIS — M25661 Stiffness of right knee, not elsewhere classified: Secondary | ICD-10-CM | POA: Diagnosis not present

## 2023-10-09 DIAGNOSIS — D170 Benign lipomatous neoplasm of skin and subcutaneous tissue of head, face and neck: Secondary | ICD-10-CM | POA: Diagnosis not present

## 2023-10-09 DIAGNOSIS — R490 Dysphonia: Secondary | ICD-10-CM | POA: Diagnosis not present

## 2023-10-10 DIAGNOSIS — M25661 Stiffness of right knee, not elsewhere classified: Secondary | ICD-10-CM | POA: Diagnosis not present

## 2023-10-10 DIAGNOSIS — M25561 Pain in right knee: Secondary | ICD-10-CM | POA: Diagnosis not present

## 2023-10-12 DIAGNOSIS — M25561 Pain in right knee: Secondary | ICD-10-CM | POA: Diagnosis not present

## 2023-10-12 DIAGNOSIS — M25661 Stiffness of right knee, not elsewhere classified: Secondary | ICD-10-CM | POA: Diagnosis not present

## 2023-10-15 DIAGNOSIS — M25661 Stiffness of right knee, not elsewhere classified: Secondary | ICD-10-CM | POA: Diagnosis not present

## 2023-10-15 DIAGNOSIS — M25561 Pain in right knee: Secondary | ICD-10-CM | POA: Diagnosis not present

## 2023-10-22 DIAGNOSIS — M25561 Pain in right knee: Secondary | ICD-10-CM | POA: Diagnosis not present

## 2023-10-22 DIAGNOSIS — M25661 Stiffness of right knee, not elsewhere classified: Secondary | ICD-10-CM | POA: Diagnosis not present

## 2023-10-25 ENCOUNTER — Other Ambulatory Visit: Payer: Self-pay

## 2023-10-25 ENCOUNTER — Encounter (HOSPITAL_BASED_OUTPATIENT_CLINIC_OR_DEPARTMENT_OTHER): Payer: Self-pay | Admitting: Family Medicine

## 2023-10-25 ENCOUNTER — Ambulatory Visit: Payer: Medicare Other | Attending: Family Medicine | Admitting: Family Medicine

## 2023-10-25 VITALS — BP 138/72 | HR 53 | Temp 97.1°F | Ht 72.64 in | Wt 183.0 lb

## 2023-10-25 DIAGNOSIS — M25561 Pain in right knee: Secondary | ICD-10-CM | POA: Diagnosis not present

## 2023-10-25 DIAGNOSIS — M25661 Stiffness of right knee, not elsewhere classified: Secondary | ICD-10-CM | POA: Diagnosis not present

## 2023-10-25 DIAGNOSIS — Z0000 Encounter for general adult medical examination without abnormal findings: Secondary | ICD-10-CM | POA: Diagnosis present

## 2023-10-25 DIAGNOSIS — Z789 Other specified health status: Secondary | ICD-10-CM | POA: Diagnosis not present

## 2023-10-25 DIAGNOSIS — I10 Essential (primary) hypertension: Secondary | ICD-10-CM | POA: Diagnosis present

## 2023-10-25 DIAGNOSIS — E119 Type 2 diabetes mellitus without complications: Secondary | ICD-10-CM | POA: Diagnosis not present

## 2023-10-25 DIAGNOSIS — E559 Vitamin D deficiency, unspecified: Secondary | ICD-10-CM | POA: Diagnosis present

## 2023-10-25 DIAGNOSIS — M722 Plantar fascial fibromatosis: Secondary | ICD-10-CM | POA: Insufficient documentation

## 2023-10-25 LAB — BASIC METABOLIC PANEL
ANION GAP: 10 mmol/L (ref 10–22)
BUN (UREA NITROGEN): 17 mg/dL (ref 7–18)
CALCIUM: 10 mg/dL (ref 8.5–10.5)
CARBON DIOXIDE: 30 mmol/L (ref 21–32)
CHLORIDE: 105 mmol/L (ref 98–107)
CREATININE: 1.2 mg/dL (ref 0.7–1.2)
ESTIMATED GLOMERULAR FILT RATE: >60 mL/min (ref >60)
Glucose Random: 107 mg/dL (ref 74–160)
POTASSIUM: 4.2 mmol/L (ref 3.5–5.1)
SODIUM: 144 mmol/L (ref 136–145)

## 2023-10-25 LAB — VITAMIN D,25 HYDROXY: VITAMIN D,25 HYDROXY: 77 ng/mL (ref 30.0–100.0)

## 2023-10-25 LAB — HEMOGLOBIN A1C
ESTIMATED AVERAGE GLUCOSE: 114 mg/dL (ref 74–160)
HEMOGLOBIN A1C: 5.6 % (ref 4.0–5.6)

## 2023-10-25 LAB — MICROALBUMIN RANDOM URINE
ALBUMIN URINE RANDOM: <1.2 mg/dL (ref 0.0–1.9)
CREATININE RANDOM URINE: 119 mg/dL (ref 39–259)

## 2023-10-25 LAB — LIPID PANEL
Cholesterol: 289 mg/dL — ABNORMAL HIGH (ref 0–239)
HIGH DENSITY LIPOPROTEIN: 94 mg/dL (ref 40–60)
LOW DENSITY LIPOPROTEIN DIRECT: 196 mg/dL — ABNORMAL HIGH (ref 0–189)
TRIGLYCERIDES: 101 mg/dL (ref 0–150)

## 2023-10-25 NOTE — Progress Notes (Signed)
 Clinic Note:    SUBJECTIVE:  Eric Kemp is a 83 year old male with the following active problem list:    Patient Active Problem List:     Melanoma of skin (HCC)     Dyslipidemia     Calculus of kidney     Essential hypertension     Plantar fasciitis      CC:     1) heel pain  -x several wks  -started around the time when he went from walking 6 mi/day to 12 mi/day  -thinks maybe plantar fasciitis, has had this bf  -massage helps  -also had callous in the area that he was doing pomace stone on     2) HTN  -wnl today  -no cp, sob  Most Recent BP Reading(s)  10/25/23 : 138/72  11/23/22 : 129/68  06/23/22 : 130/70  06/20/22 : 158/90  06/09/22 : 137/68       No past medical history on file.    No past surgical history on file.    amLODIPine (NORVASC) 10 MG tablet, Take 1 tablet by mouth daily, Disp: 90 tablet, Rfl: 0  albuterol HFA 108 (90 Base) MCG/ACT inhaler, Inhale 2 puffs into the lungs every 6 (six) hours as needed for Wheezing  for up to 14 days, Disp: 1 each, Rfl: 0    No current facility-administered medications on file prior to visit.      Review of Patient's Allergies indicates:   Sulfa antibiotics       Hives, Rash    Review of patient's family history indicates:  Problem: Heart      Relation: Mother          Age of Onset: 13          Comment: had bypass, dementia  Problem: Mental/Emotional Disorders      Relation: Mother          Age of Onset: (Not Specified)          Comment: dementia  Problem: Cancer - Ovarian      Relation: Mother          Age of Onset: 20          Comment: early diagnosis and removal  Problem: Heart      Relation: Father          Age of Onset: (Not Specified)  Problem: Stroke      Relation: Father          Age of Onset: (Not Specified)  Problem: Cancer - Colon      Relation: Father          Age of Onset: (Not Specified)          Comment: died from a blockage - 44s      Social History     Socioeconomic History    Marital status: Married     Spouse name: Not on file    Number of children:  Not on file    Years of education: Not on file    Highest education level: Not on file   Occupational History    Not on file   Tobacco Use    Smoking status: Never    Smokeless tobacco: Never   Substance and Sexual Activity    Alcohol use: Yes    Drug use: Not on file    Sexual activity: Not on file   Other Topics Concern    Not on file   Social History Narrative  Not on file   Social Drivers of Health  Financial Resource Strain: Not on file  Food Insecurity: Unknown (08/29/2022)      Received from [facility], [facility] Insecurity          In the past 3 months, have you had to go without food for 24 hours, multiple times due to lack of resources?: Not on file  Transportation Needs: Unknown (08/29/2022)      Received from [facility], [facility]      Transportation Needs          In the past 3 months, has lack of transporation kept you from medical appointments or getting things you need that are essential to your health?: Not on file  Physical Activity: Not on file  Stress: Not on file  Social Connections: Unknown (10/18/2022)      Received from [employer], [facility]      Social Connections          In the past 3 months, do you feel that you lack companionship or social support?: Not on file  Intimate Partner Violence: Not on file  Housing Stability: Not on file    OBJECTIVE:  BP 138/72   Pulse 53   Temp 97.1 F (36.2 C) (Temporal)   Ht 6' 0.64" (1.845 m)   Wt 83 kg (183 lb)   SpO2 97%   BMI 24.39 kg/m             Physical Exam  Constitutional:       Appearance: Normal appearance.   Pulmonary:      Effort: No respiratory distress.   Neurological:      Mental Status: He is alert.   Psychiatric:         Mood and Affect: Mood normal.         Behavior: Behavior normal.         Thought Content: Thought content normal.         Judgment: Judgment normal.         A/P:   (I10) Essential hypertension  (primary encounter diagnosis)  Plan: BASIC METABOLIC PANEL, MICROALBUMIN RANDOM          URINE            (Z00.00) Healthcare maintenance  Plan: HEMOGLOBIN A1C, LIPID PANEL, VITAMIN D,25         HYDROXY            (M72.2) Plantar fasciitis  Plan:   -exercises given  -tylenol  -short course of ibuprofen if he needs  -discussed rest until feeling better then adding activity back slowly  -if not getting better with above, consider xray +/- podiatry referral           1. The patient indicates understanding of these issues and agrees with the plan.  2.  The patient is given an After Visit Summary sheet that lists all of their medications with directions, their allergies, orders placed during this encounter, immunization dates, and follow- up instructions.  3. I reviewed the patient's medical information and medical history   4.  I reconciled the patient's medication list and prepared and supplied needed refills.  5.  I have reviewed the past medical, family, and social history sections including the medications and allergies listed in the above medical record      Nathanial Millman, [place], 10/25/2023

## 2023-10-26 DIAGNOSIS — R310 Gross hematuria: Secondary | ICD-10-CM | POA: Diagnosis not present

## 2023-10-26 DIAGNOSIS — C61 Malignant neoplasm of prostate: Secondary | ICD-10-CM | POA: Diagnosis not present

## 2023-10-29 ENCOUNTER — Telehealth (HOSPITAL_BASED_OUTPATIENT_CLINIC_OR_DEPARTMENT_OTHER): Payer: Self-pay

## 2023-10-29 NOTE — Telephone Encounter (Signed)
-----   Message from Nathanial Millman sent at 10/29/2023 11:30 AM EST -----  Regarding: high cholesterol  Hi, pls call and review:     Your cholesterol:  Total: yours were 289 (The goal is less than 200)  HDL (good cholesterol): yours were 94 (The goal is greater than 40)  LDL (bad cholesterol): yours were 196  (The goal is less than 130)  Triglycerides: yours were 101 (The goal is less than 150)    Overall these results look high.  He is at increased risk of a cardiovascular event so I do recommend a statin cholesterol lowering medication. If he is agreeable, where would he like the medication sent?    Eating a low-fat, low-cholesterol diet (lots of fruits and vegetables, minimizing high fat dairy products -milks/cheeses- red meat, eggs, and fried foods) and getting regular exercise can also help keep your cholesterol under control.    -Rz

## 2023-10-29 NOTE — Telephone Encounter (Signed)
 1st attempt to outreach x2, no answer.  Message left on voicemail to call clinic back and ask to speak to a nurse at earliest convenience.   Contact info provided.    Wellington Hampshire, RN 10/29/23 3:17 PM

## 2023-10-30 NOTE — Telephone Encounter (Signed)
 Patient returned call today. Waiting to be called back to discuss results with nurse.

## 2023-10-31 ENCOUNTER — Other Ambulatory Visit (HOSPITAL_BASED_OUTPATIENT_CLINIC_OR_DEPARTMENT_OTHER): Payer: Self-pay | Admitting: Family Medicine

## 2023-10-31 ENCOUNTER — Encounter (HOSPITAL_BASED_OUTPATIENT_CLINIC_OR_DEPARTMENT_OTHER): Payer: Self-pay | Admitting: Family Medicine

## 2023-10-31 DIAGNOSIS — M25661 Stiffness of right knee, not elsewhere classified: Secondary | ICD-10-CM | POA: Diagnosis not present

## 2023-10-31 DIAGNOSIS — M25561 Pain in right knee: Secondary | ICD-10-CM | POA: Diagnosis not present

## 2023-10-31 MED ORDER — ATORVASTATIN CALCIUM 40 MG PO TABS: 40.0000 mg | ORAL_TABLET | Freq: Every day | ORAL | 11 refills | Status: DC

## 2023-10-31 NOTE — Telephone Encounter (Signed)
 PER Pharmacy, Eric Kemp is a 83 year old male has requested a refill of ketoconazole.      Last Office Visit: 10/25/2023 Gwinda Maine., PA-C     Last Physical Exam: 10/10/2021    There are no preventive care reminders to display for this patient.    Other Med Adult:  Most Recent BP Reading(s)  10/25/23 : 138/72        Cholesterol (mg/dL)   Date Value   16/05/9603 289 (H)     LOW DENSITY LIPOPROTEIN DIRECT (mg/dL)   Date Value   54/04/8118 196 (H)     HIGH DENSITY LIPOPROTEIN (mg/dL)   Date Value   14/78/2956 94     TRIGLYCERIDES (mg/dL)   Date Value   21/30/8657 101         No results found for: "TSHSC"      No results found for: "TSH"    HEMOGLOBIN A1C (%)   Date Value   10/25/2023 5.6       No results found for: "POCA1C"      No results found for: "INR"    SODIUM (mmol/L)   Date Value   10/25/2023 144       POTASSIUM (mmol/L)   Date Value   10/25/2023 4.2           CREATININE (mg/dL)   Date Value   84/69/6295 1.2       Documented patient preferred pharmacies:    [facility] - [place], [place] - [address]  Phone: [phone] Fax: [phone]

## 2023-11-02 DIAGNOSIS — M25561 Pain in right knee: Secondary | ICD-10-CM | POA: Diagnosis not present

## 2023-11-02 DIAGNOSIS — M25661 Stiffness of right knee, not elsewhere classified: Secondary | ICD-10-CM | POA: Diagnosis not present

## 2023-11-05 DIAGNOSIS — M25661 Stiffness of right knee, not elsewhere classified: Secondary | ICD-10-CM | POA: Diagnosis not present

## 2023-11-05 DIAGNOSIS — M25561 Pain in right knee: Secondary | ICD-10-CM | POA: Diagnosis not present

## 2023-11-05 NOTE — Telephone Encounter (Signed)
 Called and spoke to pt, informed him of results, pt started medication last week.   Wants to repeat lipid panel in couple months to recheck.   Lab appt scheduled.    Wellington Hampshire, RN 11/05/23 5:25 PM

## 2023-11-05 NOTE — Addendum Note (Signed)
 Addended by: Cristal Ford on: 11/05/2023 02:40 PM     Modules accepted: Orders

## 2023-11-07 ENCOUNTER — Other Ambulatory Visit (HOSPITAL_BASED_OUTPATIENT_CLINIC_OR_DEPARTMENT_OTHER): Payer: Self-pay | Admitting: Family Medicine

## 2023-11-07 DIAGNOSIS — M25561 Pain in right knee: Secondary | ICD-10-CM | POA: Diagnosis not present

## 2023-11-07 DIAGNOSIS — M25661 Stiffness of right knee, not elsewhere classified: Secondary | ICD-10-CM | POA: Diagnosis not present

## 2023-11-07 DIAGNOSIS — E785 Hyperlipidemia, unspecified: Secondary | ICD-10-CM

## 2023-11-09 DIAGNOSIS — H35372 Puckering of macula, left eye: Secondary | ICD-10-CM | POA: Diagnosis not present

## 2023-11-09 DIAGNOSIS — H401132 Primary open-angle glaucoma, bilateral, moderate stage: Secondary | ICD-10-CM | POA: Diagnosis not present

## 2023-11-12 DIAGNOSIS — M25661 Stiffness of right knee, not elsewhere classified: Secondary | ICD-10-CM | POA: Diagnosis not present

## 2023-11-12 DIAGNOSIS — M25561 Pain in right knee: Secondary | ICD-10-CM | POA: Diagnosis not present

## 2023-11-14 DIAGNOSIS — M25561 Pain in right knee: Secondary | ICD-10-CM | POA: Diagnosis not present

## 2023-11-14 DIAGNOSIS — M25661 Stiffness of right knee, not elsewhere classified: Secondary | ICD-10-CM | POA: Diagnosis not present

## 2023-11-19 DIAGNOSIS — M25561 Pain in right knee: Secondary | ICD-10-CM | POA: Diagnosis not present

## 2023-11-19 DIAGNOSIS — M25661 Stiffness of right knee, not elsewhere classified: Secondary | ICD-10-CM | POA: Diagnosis not present

## 2023-11-21 DIAGNOSIS — M25561 Pain in right knee: Secondary | ICD-10-CM | POA: Diagnosis not present

## 2023-11-21 DIAGNOSIS — M25661 Stiffness of right knee, not elsewhere classified: Secondary | ICD-10-CM | POA: Diagnosis not present

## 2023-11-26 DIAGNOSIS — M25561 Pain in right knee: Secondary | ICD-10-CM | POA: Diagnosis not present

## 2023-11-26 DIAGNOSIS — M25661 Stiffness of right knee, not elsewhere classified: Secondary | ICD-10-CM | POA: Diagnosis not present

## 2023-11-28 DIAGNOSIS — M25661 Stiffness of right knee, not elsewhere classified: Secondary | ICD-10-CM | POA: Diagnosis not present

## 2023-11-28 DIAGNOSIS — M25561 Pain in right knee: Secondary | ICD-10-CM | POA: Diagnosis not present

## 2023-12-05 DIAGNOSIS — Z23 Encounter for immunization: Secondary | ICD-10-CM | POA: Diagnosis not present

## 2023-12-10 ENCOUNTER — Other Ambulatory Visit (HOSPITAL_BASED_OUTPATIENT_CLINIC_OR_DEPARTMENT_OTHER): Payer: Self-pay | Admitting: Family Medicine

## 2023-12-10 DIAGNOSIS — I10 Essential (primary) hypertension: Secondary | ICD-10-CM

## 2023-12-10 NOTE — Telephone Encounter (Signed)
Refill at pharmacy

## 2023-12-28 DIAGNOSIS — H353132 Nonexudative age-related macular degeneration, bilateral, intermediate dry stage: Secondary | ICD-10-CM | POA: Diagnosis not present

## 2023-12-31 ENCOUNTER — Telehealth (HOSPITAL_BASED_OUTPATIENT_CLINIC_OR_DEPARTMENT_OTHER): Payer: Self-pay | Admitting: Family Medicine

## 2023-12-31 NOTE — Telephone Encounter (Signed)
 LVM schedule tdap with RN pt is due

## 2024-01-01 DIAGNOSIS — E041 Nontoxic single thyroid nodule: Secondary | ICD-10-CM | POA: Diagnosis not present

## 2024-01-01 DIAGNOSIS — I5022 Chronic systolic (congestive) heart failure: Secondary | ICD-10-CM | POA: Diagnosis not present

## 2024-01-01 DIAGNOSIS — R7303 Prediabetes: Secondary | ICD-10-CM | POA: Diagnosis not present

## 2024-01-01 DIAGNOSIS — I110 Hypertensive heart disease with heart failure: Secondary | ICD-10-CM | POA: Diagnosis not present

## 2024-01-01 DIAGNOSIS — I25118 Atherosclerotic heart disease of native coronary artery with other forms of angina pectoris: Secondary | ICD-10-CM | POA: Diagnosis not present

## 2024-01-01 DIAGNOSIS — Z8546 Personal history of malignant neoplasm of prostate: Secondary | ICD-10-CM | POA: Diagnosis not present

## 2024-01-01 DIAGNOSIS — E785 Hyperlipidemia, unspecified: Secondary | ICD-10-CM | POA: Diagnosis not present

## 2024-01-08 DIAGNOSIS — D170 Benign lipomatous neoplasm of skin and subcutaneous tissue of head, face and neck: Secondary | ICD-10-CM | POA: Diagnosis not present

## 2024-01-08 DIAGNOSIS — Z23 Encounter for immunization: Secondary | ICD-10-CM | POA: Diagnosis not present

## 2024-01-08 DIAGNOSIS — Z0000 Encounter for general adult medical examination without abnormal findings: Secondary | ICD-10-CM | POA: Diagnosis not present

## 2024-01-08 DIAGNOSIS — J439 Emphysema, unspecified: Secondary | ICD-10-CM | POA: Diagnosis not present

## 2024-01-08 DIAGNOSIS — I4891 Unspecified atrial fibrillation: Secondary | ICD-10-CM | POA: Diagnosis not present

## 2024-01-08 DIAGNOSIS — D696 Thrombocytopenia, unspecified: Secondary | ICD-10-CM | POA: Diagnosis not present

## 2024-01-08 DIAGNOSIS — I5022 Chronic systolic (congestive) heart failure: Secondary | ICD-10-CM | POA: Diagnosis not present

## 2024-01-08 DIAGNOSIS — M170 Bilateral primary osteoarthritis of knee: Secondary | ICD-10-CM | POA: Diagnosis not present

## 2024-01-08 DIAGNOSIS — Z1331 Encounter for screening for depression: Secondary | ICD-10-CM | POA: Diagnosis not present

## 2024-01-08 DIAGNOSIS — Z1339 Encounter for screening examination for other mental health and behavioral disorders: Secondary | ICD-10-CM | POA: Diagnosis not present

## 2024-01-08 DIAGNOSIS — E785 Hyperlipidemia, unspecified: Secondary | ICD-10-CM | POA: Diagnosis not present

## 2024-01-08 DIAGNOSIS — I712 Thoracic aortic aneurysm, without rupture, unspecified: Secondary | ICD-10-CM | POA: Diagnosis not present

## 2024-01-08 DIAGNOSIS — I25118 Atherosclerotic heart disease of native coronary artery with other forms of angina pectoris: Secondary | ICD-10-CM | POA: Diagnosis not present

## 2024-01-08 DIAGNOSIS — E041 Nontoxic single thyroid nodule: Secondary | ICD-10-CM | POA: Diagnosis not present

## 2024-01-08 DIAGNOSIS — I110 Hypertensive heart disease with heart failure: Secondary | ICD-10-CM | POA: Diagnosis not present

## 2024-01-10 ENCOUNTER — Encounter (HOSPITAL_BASED_OUTPATIENT_CLINIC_OR_DEPARTMENT_OTHER): Payer: Self-pay | Admitting: Family Medicine

## 2024-01-10 DIAGNOSIS — I10 Essential (primary) hypertension: Secondary | ICD-10-CM

## 2024-01-11 MED ORDER — AMLODIPINE BESYLATE 10 MG PO TABS
10.0000 mg | ORAL_TABLET | Freq: Every day | ORAL | 3 refills | Status: AC
Start: 2024-01-11 — End: 2025-01-10

## 2024-01-11 NOTE — Telephone Encounter (Signed)
 This prescription refill request has passed the Rx Renewal Authorization protocol.  This medication has been approved and sent to the patient's preferred pharmacy.      PER Pharmacy, Eric Kemp is a 83 year old male has requested a refill of amlodipine .      Last Office Visit: 10/25/23 with Nemiah Banister  Last Physical Exam: 10/10/21    There are no preventive care reminders to display for this patient.    Other Med Adult:  Most Recent BP Reading(s)  10/25/23 : 138/72        Cholesterol (mg/dL)   Date Value   21/30/8657 289 (H)     LOW DENSITY LIPOPROTEIN DIRECT (mg/dL)   Date Value   84/69/6295 196 (H)     HIGH DENSITY LIPOPROTEIN (mg/dL)   Date Value   28/41/3244 94     TRIGLYCERIDES (mg/dL)   Date Value   08/30/7251 101         No results found for: "TSHSC"      No results found for: "TSH"    HEMOGLOBIN A1C (%)   Date Value   10/25/2023 5.6       No results found for: "POCA1C"      No results found for: "INR"    SODIUM (mmol/L)   Date Value   10/25/2023 144       POTASSIUM (mmol/L)   Date Value   10/25/2023 4.2           CREATININE (mg/dL)   Date Value   66/44/0347 1.2       Documented patient preferred pharmacies:    [facility] - [place], [place] - [address]  Phone: [phone] Fax: [phone]

## 2024-01-17 DIAGNOSIS — H43813 Vitreous degeneration, bilateral: Secondary | ICD-10-CM | POA: Diagnosis not present

## 2024-01-17 DIAGNOSIS — H353123 Nonexudative age-related macular degeneration, left eye, advanced atrophic without subfoveal involvement: Secondary | ICD-10-CM | POA: Diagnosis not present

## 2024-01-17 DIAGNOSIS — H353112 Nonexudative age-related macular degeneration, right eye, intermediate dry stage: Secondary | ICD-10-CM | POA: Diagnosis not present

## 2024-01-22 ENCOUNTER — Ambulatory Visit: Attending: Family Medicine | Admitting: Lab

## 2024-01-22 ENCOUNTER — Other Ambulatory Visit: Payer: Self-pay

## 2024-01-22 DIAGNOSIS — Z0000 Encounter for general adult medical examination without abnormal findings: Secondary | ICD-10-CM | POA: Diagnosis present

## 2024-01-22 DIAGNOSIS — E785 Hyperlipidemia, unspecified: Secondary | ICD-10-CM | POA: Insufficient documentation

## 2024-01-22 LAB — LIPID PANEL
Cholesterol: 163 mg/dL (ref 0–239)
HIGH DENSITY LIPOPROTEIN: 77 mg/dL (ref 40–60)
LOW DENSITY LIPOPROTEIN DIRECT: 84 mg/dL (ref 0–189)
TRIGLYCERIDES: 82 mg/dL (ref 0–150)

## 2024-01-22 NOTE — Progress Notes (Signed)
 Labs drawn

## 2024-01-23 ENCOUNTER — Encounter (HOSPITAL_BASED_OUTPATIENT_CLINIC_OR_DEPARTMENT_OTHER): Payer: Self-pay | Admitting: Family Medicine

## 2024-01-30 ENCOUNTER — Other Ambulatory Visit: Payer: Self-pay

## 2024-01-30 ENCOUNTER — Ambulatory Visit: Payer: Medicare Other | Attending: Dermatology | Admitting: Dermatology

## 2024-01-30 DIAGNOSIS — D0462 Carcinoma in situ of skin of left upper limb, including shoulder: Secondary | ICD-10-CM | POA: Diagnosis not present

## 2024-01-30 DIAGNOSIS — L728 Other follicular cysts of the skin and subcutaneous tissue: Secondary | ICD-10-CM | POA: Diagnosis not present

## 2024-01-30 DIAGNOSIS — L570 Actinic keratosis: Secondary | ICD-10-CM | POA: Diagnosis not present

## 2024-01-30 DIAGNOSIS — X32XXXD Exposure to sunlight, subsequent encounter: Secondary | ICD-10-CM | POA: Diagnosis not present

## 2024-01-30 DIAGNOSIS — L578 Other skin changes due to chronic exposure to nonionizing radiation: Secondary | ICD-10-CM | POA: Diagnosis present

## 2024-01-30 DIAGNOSIS — D229 Melanocytic nevi, unspecified: Secondary | ICD-10-CM | POA: Diagnosis present

## 2024-01-30 DIAGNOSIS — Z85820 Personal history of malignant melanoma of skin: Secondary | ICD-10-CM | POA: Insufficient documentation

## 2024-01-30 NOTE — Progress Notes (Signed)
 [facility]  [address], [place], [place] [address]    Dermatology Clinic Note    CC: skin check.   ?  HPI: Eric Kemp is a 83 year old male     He reports a history of a melanoma on his back. Unclear depth but per notes by Dr. Suzie Estelle it seems it most likely an insitu on the R back in 2017.   ?  ROS: negative, no other skin complaints.  Feels well, no systemic complaints.   ?  Past Dermatologic hx:    SPEC #: [id number]       RECD: 06/30/22-1454  STATUS: SOUT  SP TYPE: DERM            COLL: 06/30/22-  SUB DR: Lionell Riddles MD     ENTERED:  06/30/22-1454     ORDERED:  [id number] (TYPE 4), L1-L3/2, L4-L6/2, L7-L9/2     Performed at [facility], [facility]  [address], [place] [address] [phone]     >>FINAL DIAGNOSIS<<     SKIN SHAVE BIOPSY, LEFT BACK:  - Dermal nevus; present at the deep margin.  - Associated superficial dermal scar, consistent with trauma.     Note:  After review of the entire case, Amey Ka MD of [facility] has  rendered the above diagnosis, with which I concur.     Diagnosis by: Curtis Dow, MD     ?  PMH:  Patient Active Problem List:     Melanoma of skin (HCC)     Dyslipidemia     Calculus of kidney     Essential hypertension     Plantar fasciitis    ?  Meds:  ?     Medication List            Accurate as of June 30, 2022  1:02 PM. If you have any questions, ask your nurse or doctor.                CONTINUE taking these medications      albuterol  HFA 108 (90 Base) MCG/ACT inhaler  Inhale 2 puffs into the lungs every 6 (six) hours as needed for Wheezing  for up to 14 days     amLODIPine  10 MG tablet  Commonly known as: NORVASC   Take 1 tablet by mouth in the morning.     amoxicillin -clavulanate 875-125 MG per tablet  Commonly known as: AUGMENTIN   Take 1 tablet by mouth in the morning and 1 tablet before bedtime. Do all this for 7 days.            ?  All:  Review of Patient's Allergies indicates:   Sulfa antibiotics        Hives, Rash?     SHx:  ?Social History    Tobacco Use      Smoking status: Never      Smokeless tobacco: Never    Alcohol use: Yes       FHx:  Hx of melanoma - , hx of NMSC -  ?  Physical exam/Assessment/Plan:  Well appearing pt in NAD  Mood and affect are wnl  A full skin examination was performed including scalp, head, eyes, ears, nose,  lips,  neck, chest, axillae, abdomen, back, buttocks, bilateral upper extremities, bilateral lower extremities, hands, feet, fingers, toes, fingernails, and toenails.   Skin type: II    # Benign nevi, scattered on trunk and extremities: benign appearing brown macules and papules,  most <27mm with no concerning features on exam or dermoscopy.   - Skin self-examination reviewed; ABCD's discussed.  - Sun protection and avoidance reviewed, including proper use of broad-spectrum UVB/UVA sunscreens with SPF 30 or greater, re-application after swimming and q 3-4 hours emphasized.?  Monitor:   - L abd now 5x48mm (prior 4x15mm) speckeld macule with reticular pattern, unchanged.  - L wrist 8x58mm tan macule with heiroglyphic pattern.   -- both unchanged.     # Dermatoheliosis of sun-exposed areas. Benign tan macules with moth-eaten borders c/w solar lentigines present.   - Skin self-examination reviewed; ABCD's discussed.   ??- Sun protection and avoidance reviewed, including proper use of broad-spectrum UVB/UVA sunscreens with SPF 30 or greater, re-application after swimming and q 3-4 hours emphasized.    # History of melanoma. Unknown depth, R back 2017.   - Well healed scar, NER.   - No cervical, axillary, supraclavicular, or inguinal lymphadenopathy appreciated on exam.   - ROS negative.   - Skin self-examination reviewed; ABCD's discussed.   ??- Sun protection and avoidance reviewed, including proper use of broad-spectrum UVB/UVA sunscreens with SPF 30 or greater, re-application after swimming and q 3-4 hours emphasized.  - There is slight linear pigmentation, I do not think this is true  recurrence but instead a lentigo growing into the scar. New photo taken.           RTC: 14mo TBSE      ?  ?  ?  Majorie Scrape, MD  [facility]    Melanoma quality documentation:   Melanoma staging: unknown  Next screening skin examination scheduled: YES   EPIC reminder message sent: YES  Pt added to dermatology melanoma list: YES           CC: Precious Broccoli, MD  [address]  [place] [address]

## 2024-03-06 DIAGNOSIS — M1712 Unilateral primary osteoarthritis, left knee: Secondary | ICD-10-CM | POA: Diagnosis not present

## 2024-03-06 DIAGNOSIS — Z96651 Presence of right artificial knee joint: Secondary | ICD-10-CM | POA: Diagnosis not present

## 2024-03-10 ENCOUNTER — Telehealth: Payer: Self-pay | Admitting: *Deleted

## 2024-03-10 NOTE — Telephone Encounter (Signed)
   Pre-operative Risk Assessment    Patient Name: Kevin Day  DOB: 08-Mar-1941 MRN: [id number]   Date of last office visit: 04/10/22 DR. CAMNITZ Date of next office visit: NONE   Request for Surgical Clearance    Procedure:  LEFT TOTAL KNEE ARTHROPLASTY  Date of Surgery:  Clearance 05/26/24                                Surgeon:  DR. DEMPSEY MOAN Surgeon's Group or Practice Name:  [facility] Phone number:  [phone] KERRI MAZE Fax number:  [phone]   Type of Clearance Requested:   - Medical  - Pharmacy:  Hold Apixaban  (Eliquis )     Type of Anesthesia:  CHOICE   Additional requests/questions:    [name]   03/10/2024, 4:21 PM

## 2024-03-11 DIAGNOSIS — Z85828 Personal history of other malignant neoplasm of skin: Secondary | ICD-10-CM | POA: Diagnosis not present

## 2024-03-11 DIAGNOSIS — L570 Actinic keratosis: Secondary | ICD-10-CM | POA: Diagnosis not present

## 2024-03-11 DIAGNOSIS — Z08 Encounter for follow-up examination after completed treatment for malignant neoplasm: Secondary | ICD-10-CM | POA: Diagnosis not present

## 2024-03-11 DIAGNOSIS — X32XXXD Exposure to sunlight, subsequent encounter: Secondary | ICD-10-CM | POA: Diagnosis not present

## 2024-03-14 DIAGNOSIS — H401132 Primary open-angle glaucoma, bilateral, moderate stage: Secondary | ICD-10-CM | POA: Diagnosis not present

## 2024-03-14 NOTE — Telephone Encounter (Signed)
   Name: Kevin Day  DOB: 1941/04/30  MRN: [id number]  Primary Cardiologist: Ozell Fell, MD   Preoperative team, please contact this patient and set up a phone call appointment for further preoperative risk assessment. Please obtain consent and complete medication review. Thank you for your help.  I confirm that guidance regarding antiplatelet and oral anticoagulation therapy has been completed and, if necessary, noted below.  Patient with diagnosis of atrial fibrillation on Eliquis  for anticoagulation.     Procedure:  LEFT TOTAL KNEE ARTHROPLASTY   Date of Surgery:  Clearance 05/26/24     CHA2DS2-VASc Score = 4   This indicates a 4.8% annual risk of stroke. The patient's score is based upon: CHF History: 0 HTN History: 1 Diabetes History: 0 Stroke History: 0 Vascular Disease History: 1 Age Score: 2 Gender Score: 0     CrCl 85 Platelet count 121   Patient has not had an Afib/aflutter ablation within the last 3 months or DCCV within the last 30 days.   Per office protocol, patient can hold Eliquis  for 3 days prior to procedure.   Patient will not need bridging with Lovenox (enoxaparin) around procedure.  I also confirmed the patient resides in the state of [place] . As per [facility] Medical Board telemedicine laws, the patient must reside in the state in which the provider is licensed.   Artist Pouch, PA-C 03/14/2024, 3:50 PM [facility]

## 2024-03-14 NOTE — Telephone Encounter (Signed)
 Patient with diagnosis of atrial fibrillation on Eliquis  for anticoagulation.    rocedure:  LEFT TOTAL KNEE ARTHROPLASTY   Date of Surgery:  Clearance 05/26/24   CHA2DS2-VASc Score = 4   This indicates a 4.8% annual risk of stroke. The patient's score is based upon: CHF History: 0 HTN History: 1 Diabetes History: 0 Stroke History: 0 Vascular Disease History: 1 Age Score: 2 Gender Score: 0    CrCl 85 Platelet count 121  Patient has not had an Afib/aflutter ablation within the last 3 months or DCCV within the last 30 days.  Per office protocol, patient can hold Eliquis  for 3 days prior to procedure.   Patient will not need bridging with Lovenox (enoxaparin) around procedure.  **This guidance is not considered finalized until pre-operative APP has relayed final recommendations.**

## 2024-03-14 NOTE — Telephone Encounter (Signed)
 1st attempt to reach pt regarding surgical clearance and the need for an TELE appointment.  Left pt a detailed message to call back and get that scheduled.

## 2024-03-17 ENCOUNTER — Telehealth: Payer: Self-pay

## 2024-03-17 NOTE — Telephone Encounter (Signed)
 Med Rec and Consent done     Patient Consent for Virtual Visit        Kevin Day has provided verbal consent on 03/17/2024 for a virtual visit (video or telephone).   CONSENT FOR VIRTUAL VISIT FOR:  Kevin Day  By participating in this virtual visit I agree to the following:  I hereby voluntarily request, consent and authorize [facility] and its employed or contracted physicians, physician assistants, nurse practitioners or other licensed health care professionals (the Practitioner), to provide me with telemedicine health care services (the "Services) as deemed necessary by the treating Practitioner. I acknowledge and consent to receive the Services by the Practitioner via telemedicine. I understand that the telemedicine visit will involve communicating with the Practitioner through live audiovisual communication technology and the disclosure of certain medical information by electronic transmission. I acknowledge that I have been given the opportunity to request an in-person assessment or other available alternative prior to the telemedicine visit and am voluntarily participating in the telemedicine visit.  I understand that I have the right to withhold or withdraw my consent to the use of telemedicine in the course of my care at any time, without affecting my right to future care or treatment, and that the Practitioner or I may terminate the telemedicine visit at any time. I understand that I have the right to inspect all information obtained and/or recorded in the course of the telemedicine visit and may receive copies of available information for a reasonable fee.  I understand that some of the potential risks of receiving the Services via telemedicine include:  Delay or interruption in medical evaluation due to technological equipment failure or disruption; Information transmitted may not be sufficient (e.g. poor resolution of images) to allow for appropriate medical decision  making by the Practitioner; and/or  In rare instances, security protocols could fail, causing a breach of personal health information.  Furthermore, I acknowledge that it is my responsibility to provide information about my medical history, conditions and care that is complete and accurate to the best of my ability. I acknowledge that Practitioner's advice, recommendations, and/or decision may be based on factors not within their control, such as incomplete or inaccurate data provided by me or distortions of diagnostic images or specimens that may result from electronic transmissions. I understand that the practice of medicine is not an exact science and that Practitioner makes no warranties or guarantees regarding treatment outcomes. I acknowledge that a copy of this consent can be made available to me via my patient portal [facility] MyChart), or I can request a printed copy by calling the office of [facility].    I understand that my insurance will be billed for this visit.   I have read or had this consent read to me. I understand the contents of this consent, which adequately explains the benefits and risks of the Services being provided via telemedicine.  I have been provided ample opportunity to ask questions regarding this consent and the Services and have had my questions answered to my satisfaction. I give my informed consent for the services to be provided through the use of telemedicine in my medical care

## 2024-03-17 NOTE — Telephone Encounter (Signed)
 S/W pt and shceduled TELE Preop appt 03/28/24. Med Rec and Consent done.

## 2024-03-24 ENCOUNTER — Other Ambulatory Visit (HOSPITAL_COMMUNITY): Payer: Self-pay | Admitting: Physician Assistant

## 2024-03-26 NOTE — Progress Notes (Unsigned)
 Virtual Visit via Telephone Note   Because of BERT PTACEK co-morbid illnesses, he is at least at moderate risk for complications without adequate follow up.  This format is felt to be most appropriate for this patient at this time.  Due to technical limitations with video connection (technology), today's appointment will be conducted as an audio only telehealth visit, and Kevin Day verbally agreed to proceed in this manner.   All issues noted in this document were discussed and addressed.  No physical exam could be performed with this format.  Evaluation Performed:  Preoperative cardiovascular risk assessment _____________   Date:  03/26/2024   Patient ID:  Kevin Day, DOB [birth year], MRN [id number] Patient Location:  Home Provider location:   Office  Primary Care Provider:  Valentin Skates, DO Primary Cardiologist:  Ozell Fell, MD  Chief Complaint / Patient Profile   83 y.o. y/o male with a h/o paroxysmal atrial fibrillation, CHF, HTN, hyperlipidemia who is pending left total knee arthroplasty and presents today for telephonic preoperative cardiovascular risk assessment.  History of Present Illness    Kevin Day is a 83 y.o. male who presents via audio/video conferencing for a telehealth visit today.  Pt was last seen in cardiology clinic on 08/07/2023 by Rosaline Pavy PA-C.  At that time BOW BUNTYN was doing well .  The patient is now pending procedure as outlined above. Since his last visit, he remains stable from a cardiac standpoint.  Today he denies chest pain, shortness of breath, lower extremity edema, fatigue, palpitations, melena, hematuria, hemoptysis, diaphoresis, weakness, presyncope, syncope, orthopnea, and PND.   Past Medical History    Past Medical History:  Diagnosis Date   Aortic atherosclerosis (HCC)    Arthritis    CAD (coronary artery disease)    Cataracts, bilateral    CHF (congestive heart failure) (HCC)    Dilated aortic root (HCC)     Dysrhythmia    Hyperlipidemia    Hypertension    PAF (paroxysmal atrial fibrillation) (HCC)    a/p ablation   Prostate cancer (HCC)    Ruptured lumbar disc    Skin cancer    Past Surgical History:  Procedure Laterality Date   ATRIAL FIBRILLATION ABLATION N/A 01/06/2022   Procedure: ATRIAL FIBRILLATION ABLATION;  Surgeon: Inocencio Soyla Lunger, MD;  Location: [facility];  Service: Cardiovascular;  Laterality: N/A;   BACK SURGERY     CARDIOVERSION N/A 12/01/2021   Procedure: CARDIOVERSION;  Surgeon: Barbaraann Darryle Ned, MD;  Location: [facility] ENDOSCOPY;  Service: Cardiovascular;  Laterality: N/A;   cataract Bilateral    COLONOSCOPY  10/09/2016   POLYPECTOMY     radioactive      radioactive seed implants for prostate CA.    TOTAL KNEE ARTHROPLASTY Right 08/20/2023   Procedure: TOTAL KNEE ARTHROPLASTY;  Surgeon: Melodi Lerner, MD;  Location: [facility] ORS;  Service: Orthopedics;  Laterality: Right;    Allergies  No Known Allergies  Home Medications    Prior to Admission medications   Medication Sig Start Date End Date Taking? Authorizing Provider  apixaban  (ELIQUIS ) 5 MG TABS tablet Take 1 tablet (5 mg total) by mouth 2 (two) times daily. 07/30/23   Fenton, Clint R, PA  cefdinir (OMNICEF) 300 MG capsule Take 300 mg by mouth 2 (two) times daily. 09/16/23   [provider]  Glucosamine HCl (GLUCOSAMINE PO) Take by mouth. Taking 2 tablets by mouth in the am and 1 tablet at bedtime 1500 mg  [provider]  methocarbamol  (ROBAXIN ) 500 MG tablet Take 1 tablet (500 mg total) by mouth every 6 (six) hours as needed for muscle spasms. 08/21/23   Kristian Stabs, PA  metoprolol  tartrate (LOPRESSOR ) 25 MG tablet TAKE 1 TABLET(25 MG) BY MOUTH TWICE DAILY 03/24/24   Fenton, Clint R, PA  Multiple Vitamins-Minerals (PRESERVISION AREDS 2) CAPS Take 1 capsule by mouth every morning.    [provider]  pantoprazole  (PROTONIX ) 20 MG tablet Take 20 mg by mouth daily.     [provider]  potassium chloride  (KLOR-CON ) 10 MEQ tablet TAKE 2 TABLETS(20 MEQ) BY MOUTH DAILY 09/24/23   Fenton, Clint R, PA  rosuvastatin  (CRESTOR ) 20 MG tablet TAKE 1 TABLET(20 MG) BY MOUTH DAILY 08/27/23   Wonda Sharper, MD  sacubitril -valsartan  (ENTRESTO ) 24-26 MG TAKE 1 TABLET BY MOUTH TWICE DAILY 09/25/23   Wonda Sharper, MD    Physical Exam    Vital Signs:  Lynwood JAYSON Ada does not have vital signs available for review today.  Given telephonic nature of communication, physical exam is limited. AAOx3. NAD. Normal affect.  Speech and respirations are unlabored.  Accessory Clinical Findings    None  Assessment & Plan    1.  Preoperative Cardiovascular Risk Assessment: LEFT TOTAL KNEE ARTHROPLASTY   Date of Surgery:  Clearance 05/26/24                                  Surgeon:  DR. DEMPSEY MOAN Surgeon's Group or Practice Name:  [facility] Phone number:  [phone] KERRI MAZE Fax number:  [phone]      Primary Cardiologist: Sharper Wonda, MD  Chart reviewed as part of pre-operative protocol coverage. Given past medical history and time since last visit, based on ACC/AHA guidelines, JOSEANGEL NETTLETON would be at acceptable risk for the planned procedure without further cardiovascular testing.   His RCRI is moderate risk, 6.6% risk of major cardiac event.  He is able to complete greater than 4 METS of physical activity.  Patient has not had an Afib/aflutter ablation within the last 3 months or DCCV within the last 30 days.   Per office protocol, patient can hold Eliquis  for 3 days prior to procedure.   Patient will not need bridging with Lovenox (enoxaparin) around procedure.  Patient was advised that if he develops new symptoms prior to surgery to contact our office to arrange a follow-up appointment.  He verbalized understanding.  I will route this recommendation to the requesting party via Epic fax function and remove from pre-op  pool.       Time:   Today, I have spent 5 minutes with the patient with telehealth technology discussing medical history, symptoms, and management plan.  I spent 10 minutes reviewing patient's past cardiac history and cardiac medications.    Josefa CHRISTELLA Beauvais, NP  03/26/2024, 1:19 PM

## 2024-03-28 ENCOUNTER — Ambulatory Visit: Attending: Cardiology

## 2024-03-28 DIAGNOSIS — Z01810 Encounter for preprocedural cardiovascular examination: Secondary | ICD-10-CM

## 2024-04-28 DIAGNOSIS — Z23 Encounter for immunization: Secondary | ICD-10-CM | POA: Diagnosis not present

## 2024-05-01 DIAGNOSIS — I5022 Chronic systolic (congestive) heart failure: Secondary | ICD-10-CM | POA: Diagnosis not present

## 2024-05-01 DIAGNOSIS — D6869 Other thrombophilia: Secondary | ICD-10-CM | POA: Diagnosis not present

## 2024-05-01 DIAGNOSIS — I700 Atherosclerosis of aorta: Secondary | ICD-10-CM | POA: Diagnosis not present

## 2024-05-01 DIAGNOSIS — Z01818 Encounter for other preprocedural examination: Secondary | ICD-10-CM | POA: Diagnosis not present

## 2024-05-01 DIAGNOSIS — I25118 Atherosclerotic heart disease of native coronary artery with other forms of angina pectoris: Secondary | ICD-10-CM | POA: Diagnosis not present

## 2024-05-01 DIAGNOSIS — D170 Benign lipomatous neoplasm of skin and subcutaneous tissue of head, face and neck: Secondary | ICD-10-CM | POA: Diagnosis not present

## 2024-05-01 DIAGNOSIS — M170 Bilateral primary osteoarthritis of knee: Secondary | ICD-10-CM | POA: Diagnosis not present

## 2024-05-01 DIAGNOSIS — I712 Thoracic aortic aneurysm, without rupture, unspecified: Secondary | ICD-10-CM | POA: Diagnosis not present

## 2024-05-01 DIAGNOSIS — J439 Emphysema, unspecified: Secondary | ICD-10-CM | POA: Diagnosis not present

## 2024-05-01 DIAGNOSIS — D696 Thrombocytopenia, unspecified: Secondary | ICD-10-CM | POA: Diagnosis not present

## 2024-05-01 DIAGNOSIS — Z8546 Personal history of malignant neoplasm of prostate: Secondary | ICD-10-CM | POA: Diagnosis not present

## 2024-05-01 DIAGNOSIS — I4891 Unspecified atrial fibrillation: Secondary | ICD-10-CM | POA: Diagnosis not present

## 2024-05-06 ENCOUNTER — Other Ambulatory Visit (HOSPITAL_COMMUNITY): Payer: Self-pay | Admitting: *Deleted

## 2024-05-06 MED ORDER — POTASSIUM CHLORIDE ER 10 MEQ PO TBCR: 20.0000 meq | EXTENDED_RELEASE_TABLET | Freq: Every day | ORAL | 6 refills | Status: AC

## 2024-05-06 NOTE — H&P (Signed)
 TOTAL KNEE ADMISSION H&P  Patient is being admitted for left total knee arthroplasty.  Subjective:  Chief Complaint: Left knee pain.  HPI: Kevin Day, 83 y.o. male has a history of pain and functional disability in the left knee due to arthritis and has failed non-surgical conservative treatments for greater than 12 weeks to include NSAID's and/or analgesics, corticosteriod injections, and activity modification. Onset of symptoms was gradual, starting >10 years ago with gradually worsening course since that time. The patient noted no past surgery on the left knee.  Patient currently rates pain in the left knee at 9 out of 10 with activity. Patient has night pain, worsening of pain with activity and weight bearing, pain with passive range of motion, and crepitus. Patient has evidence of bone-on-bone changes in the medial and patellofemoral compartments, along with slight varus deformity by imaging studies. There is no active infection.  Patient Active Problem List   Diagnosis Date Noted   OA (osteoarthritis) of knee 08/20/2023   Primary osteoarthritis of right knee 08/20/2023   Persistent atrial fibrillation (HCC) 12/16/2021   Paroxysmal atrial fibrillation (HCC) 04/06/2021   Hypercoagulable state due to persistent atrial fibrillation (HCC) 04/06/2021   Chest pain     Past Medical History:  Diagnosis Date   Aortic atherosclerosis (HCC)    Arthritis    CAD (coronary artery disease)    Cataracts, bilateral    CHF (congestive heart failure) (HCC)    Dilated aortic root (HCC)    Dysrhythmia    Hyperlipidemia    Hypertension    PAF (paroxysmal atrial fibrillation) (HCC)    a/p ablation   Prostate cancer (HCC)    Ruptured lumbar disc    Skin cancer     Past Surgical History:  Procedure Laterality Date   ATRIAL FIBRILLATION ABLATION N/A 01/06/2022   Procedure: ATRIAL FIBRILLATION ABLATION;  Surgeon: Inocencio Soyla Lunger, MD;  Location: [facility];  Service: Cardiovascular;   Laterality: N/A;   BACK SURGERY     CARDIOVERSION N/A 12/01/2021   Procedure: CARDIOVERSION;  Surgeon: Barbaraann Darryle Ned, MD;  Location: [facility] ENDOSCOPY;  Service: Cardiovascular;  Laterality: N/A;   cataract Bilateral    COLONOSCOPY  10/09/2016   POLYPECTOMY     radioactive      radioactive seed implants for prostate CA.    TOTAL KNEE ARTHROPLASTY Right 08/20/2023   Procedure: TOTAL KNEE ARTHROPLASTY;  Surgeon: Melodi Lerner, MD;  Location: [facility] ORS;  Service: Orthopedics;  Laterality: Right;    Prior to Admission medications   Medication Sig Start Date End Date Taking? Authorizing Provider  apixaban  (ELIQUIS ) 5 MG TABS tablet Take 1 tablet (5 mg total) by mouth 2 (two) times daily. 07/30/23   Fenton, Clint R, PA  cefdinir (OMNICEF) 300 MG capsule Take 300 mg by mouth 2 (two) times daily. 09/16/23   [provider]  Glucosamine HCl (GLUCOSAMINE PO) Take by mouth. Taking 2 tablets by mouth in the am and 1 tablet at bedtime 1500 mg    [provider]  methocarbamol  (ROBAXIN ) 500 MG tablet Take 1 tablet (500 mg total) by mouth every 6 (six) hours as needed for muscle spasms. 08/21/23   Kristian Stabs, PA  metoprolol  tartrate (LOPRESSOR ) 25 MG tablet TAKE 1 TABLET(25 MG) BY MOUTH TWICE DAILY 03/24/24   Fenton, Clint R, PA  Multiple Vitamins-Minerals (PRESERVISION AREDS 2) CAPS Take 1 capsule by mouth every morning.    [provider]  pantoprazole  (PROTONIX ) 20 MG tablet Take 20 mg  by mouth daily.    [provider]  potassium chloride  (KLOR-CON ) 10 MEQ tablet TAKE 2 TABLETS(20 MEQ) BY MOUTH DAILY 09/24/23   Fenton, Clint R, PA  rosuvastatin  (CRESTOR ) 20 MG tablet TAKE 1 TABLET(20 MG) BY MOUTH DAILY 08/27/23   Wonda Sharper, MD  sacubitril -valsartan  (ENTRESTO ) 24-26 MG TAKE 1 TABLET BY MOUTH TWICE DAILY 09/25/23   Wonda Sharper, MD    No Known Allergies  Social History   Socioeconomic History   Marital status: Married    Spouse name: Not on file    Number of children: Not on file   Years of education: Not on file   Highest education level: Not on file  Occupational History   Not on file  Tobacco Use   Smoking status: Former   Smokeless tobacco: Never   Tobacco comments:    Former smoker 05/10/2021  Vaping Use   Vaping status: Never Used  Substance and Sexual Activity   Alcohol  use: No   Drug use: No   Sexual activity: Not on file  Other Topics Concern   Not on file  Social History Narrative   Not on file   Social Drivers of Health   Financial Resource Strain: Not on file  Food Insecurity: No Food Insecurity (08/20/2023)   Hunger Vital Sign    Worried About Running Out of Food in the Last Year: Never true    Ran Out of Food in the Last Year: Never true  Transportation Needs: No Transportation Needs (08/20/2023)   PRAPARE - Administrator, Civil Service (Medical): No    Lack of Transportation (Non-Medical): No  Physical Activity: Not on file  Stress: Not on file  Social Connections: Not on file  Intimate Partner Violence: Not At Risk (08/20/2023)   Humiliation, Afraid, Rape, and Kick questionnaire    Fear of Current or Ex-Partner: No    Emotionally Abused: No    Physically Abused: No    Sexually Abused: No    Tobacco Use: Medium Risk (03/28/2024)   Patient History    Smoking Tobacco Use: Former    Smokeless Tobacco Use: Never    Passive Exposure: Not on file   Social History   Substance and Sexual Activity  Alcohol  Use No    Family History  Problem Relation Age of Onset   Breast cancer Sister    Colon cancer Neg Hx    Esophageal cancer Neg Hx    Rectal cancer Neg Hx    Stomach cancer Neg Hx    Colon polyps Neg Hx     ROS  Objective:  Physical Exam: Well nourished and well developed.  General: Alert and oriented x3, cooperative and pleasant, no acute distress.  Head: normocephalic, atraumatic, neck supple.  Eyes: EOMI.  Musculoskeletal:  Left knee: No effusion. Range of motion  approximately 5-120 degrees with crepitus noted during movement. Diffuse tenderness present, but no specific joint line tenderness. No instability observed. Varus deformity noted.  Calves soft and nontender. Motor function intact in LE. Strength 5/5 LE bilaterally. Neuro: Distal pulses 2+. Sensation to light touch intact in LE.  Imaging Review Plain radiographs demonstrate severe degenerative joint disease of the left knee. The overall alignment is mild varus. The bone quality appears to be adequate for age and reported activity level.  Assessment/Plan:  End stage arthritis, left knee   The patient history, physical examination, clinical judgment of the provider and imaging studies are consistent with end stage degenerative joint disease of the  left knee and total knee arthroplasty is deemed medically necessary. The treatment options including medical management, injection therapy arthroscopy and arthroplasty were discussed at length. The risks and benefits of total knee arthroplasty were presented and reviewed. The risks due to aseptic loosening, infection, stiffness, patella tracking problems, thromboembolic complications and other imponderables were discussed. The patient acknowledged the explanation, agreed to proceed with the plan and consent was signed. Patient is being admitted for inpatient treatment for surgery, pain control, PT, OT, prophylactic antibiotics, VTE prophylaxis, progressive ambulation and ADLs and discharge planning. The patient is planning to be discharged home.   Patient's anticipated LOS is less than 2 midnights, meeting these requirements: - Lives within 1 hour of care - Has a competent adult at home to recover with post-op recover - NO history of  - Chronic pain requiring opioids  - Diabetes  - Heart attack  - Stroke  - DVT/VTE  - Respiratory Failure/COPD  - Renal failure  - Anemia  - Advanced Liver disease  Therapy Plans: Outpatient therapy at  [facility] Disposition: Home with wife Planned DVT Prophylaxis: Eliquis  5 mg BID DME Needed: None PCP: Morna Ruth, FNP (clearance received) Cardiologist: Ozell Fell, MD (clearance received) TXA: IV Allergies: Prednisone Metal Allergy: None Anesthesia Concerns: None BMI: 30.3 Last HgbA1c: Not diabetic Pain Regimen: Oxycodone , tramadol  [facility]  Other: - PMH: Afib s/p cardioversion, HF  - Patient was instructed on what medications to stop prior to surgery. - Follow-up visit in 2 weeks with Dr. Melodi - Begin physical therapy following surgery - Pre-operative lab work as pre-surgical testing - Prescriptions will be provided in hospital at time of discharge  Roxie Mess, PA-C Orthopedic Surgery [facility] [place]

## 2024-05-14 NOTE — Patient Instructions (Signed)
 SURGICAL WAITING ROOM VISITATION Patients having surgery or a procedure may have no more than 2 support people in the waiting area - these visitors may rotate in the visitor waiting room.   If the patient needs to stay at the hospital during part of their recovery, the visitor guidelines for inpatient rooms apply.  PRE-OP VISITATION  Pre-op nurse will coordinate an appropriate time for 1 support person to accompany the patient in pre-op.  This support person may not rotate.  This visitor will be contacted when the time is appropriate for the visitor to come back in the pre-op area.  Please refer to the [facility] website for the visitor guidelines for Inpatients (after your surgery is over and you are in a regular room).  You are not required to quarantine at this time prior to your surgery. However, you must do this: Hand Hygiene often Do NOT share personal items Notify your provider if you are in close contact with someone who has COVID or you develop fever 100.4 or greater, new onset of sneezing, cough, sore throat, shortness of breath or body aches.  If you test positive for Covid or have been in contact with anyone that has tested positive in the last 10 days please notify you surgeon.    Your procedure is scheduled on:  Monday 05-26-2024  Report to [facility] Main Entrance: [address] entrance where the [employer] is available.   Report to admitting at: 08:00 AM  Call this number if you have any questions or problems the morning of surgery [phone]  Do not eat food after Midnight the night prior to your surgery/procedure.  After Midnight you may have the following liquids until   07:30 AM DAY OF SURGERY  Clear Liquid Diet Water  Black Coffee (sugar ok, NO MILK/CREAM OR CREAMERS)  Tea (sugar ok, NO MILK/CREAM OR CREAMERS) regular and decaf                             Plain Jell-O  with no fruit (NO RED)                                           Fruit ices (not with  fruit pulp, NO RED)                                     Popsicles (NO RED)                                                                  Juice: NO CITRUS JUICES: only apple, WHITE grape, WHITE cranberry Sports drinks like Gatorade or Powerade (NO RED)                   The day of surgery:  Drink ONE (1) Pre-Surgery Clear Ensure at  07:30 AM the morning of surgery. Drink in one sitting. Do not sip.  This drink was given to you during your hospital pre-op appointment visit. Nothing else to drink after completing the Pre-Surgery Clear Ensure :  No candy, chewing gum or throat lozenges.    FOLLOW ANY ADDITIONAL PRE OP INSTRUCTIONS YOU RECEIVED FROM YOUR SURGEON'S OFFICE!!!   Oral Hygiene is also important to reduce your risk of infection.        Remember - BRUSH YOUR TEETH THE MORNING OF SURGERY WITH YOUR REGULAR TOOTHPASTE  Do NOT smoke after Midnight the night before surgery.  STOP TAKING all Vitamins, Herbs and supplements 1 week before your surgery.   ELIQUIS -  stop taking 72 hours before your surgery. Last dose will be taken on Thursday 05-22-24.  Take ONLY these medicines the morning of surgery with A SIP OF WATER : metoprolol , pantoprazole    DO NOT TAKE Sacubitril  / valsartan  (Entresto ) the morning of your surgery.   You may not have any metal on your body including  jewelry, and body piercing  Do not wear, lotions, powders, cologne, or deodorant  Men may shave face and neck.  Contacts, Hearing Aids, dentures or bridgework may not be worn into surgery. DENTURES WILL BE REMOVED PRIOR TO SURGERY PLEASE DO NOT APPLY Poly grip OR ADHESIVES!!!  You may bring a small overnight bag with you on the day of surgery, only pack items that are not valuable. [facility] IS NOT RESPONSIBLE   FOR VALUABLES THAT ARE LOST OR STOLEN.   Do not bring your home medications to the hospital. The Pharmacy will dispense medications listed on your medication list to you during your admission in the  Hospital.  Special Instructions: Bring a copy of your healthcare power of attorney and living will documents the day of surgery, if you wish to have them scanned into your [place] [facility] Records- EPIC  Please read over the following fact sheets you were given: IF YOU HAVE QUESTIONS ABOUT YOUR PRE-OP INSTRUCTIONS, PLEASE CALL [phone].     Pre-operative 5 CHG Bath Instructions   You can play a key role in reducing the risk of infection after surgery. Your skin needs to be as free of germs as possible. You can reduce the number of germs on your skin by washing with CHG (chlorhexidine  gluconate) soap before surgery. CHG is an antiseptic soap that kills germs and continues to kill germs even after washing.   DO NOT use if you have an allergy to chlorhexidine /CHG or antibacterial soaps. If your skin becomes reddened or irritated, stop using the CHG and notify one of our RNs at [phone]  Please shower with the CHG soap starting 4 days before surgery using the following schedule: START SHOWERS ON  THURSDAY  05-22-2024  Please keep in mind the following:  DO NOT shave, including legs and underarms, starting the day of your first shower.   You may shave your face at any point before/day of surgery.   Place clean sheets on your bed the day you start using CHG soap. Use a clean washcloth (not used since being washed) for each shower. DO NOT sleep with pets once you start using the CHG.   CHG Shower Instructions:  If you choose to wash your hair and private area, wash first with your normal shampoo/soap.  After you use shampoo/soap, rinse your hair and body thoroughly to remove shampoo/soap residue.  Turn the water  OFF and apply about 3 tablespoons (45 ml) of CHG soap to a CLEAN washcloth.  Apply CHG soap ONLY FROM  YOUR NECK DOWN TO YOUR TOES (washing for 3-5 minutes)  DO NOT use CHG soap on face, private areas, open wounds, or sores.  Pay special attention to the area where your surgery is being performed.  If you are having back surgery, having someone wash your back for you may be helpful.  Wait 2 minutes after CHG soap is applied, then you may rinse off the CHG soap.  Pat dry with a clean towel  Put on clean clothes/pajamas   If you choose to wear lotion, please use ONLY the CHG-compatible lotions on the back of this paper.     Additional instructions for the day of surgery: DO NOT APPLY any lotions, deodorants, cologne, or perfumes.   Put on clean/comfortable clothes.  Brush your teeth.  Ask your nurse before applying any prescription medications to the skin.      CHG Compatible Lotions   Aveeno Moisturizing lotion  Cetaphil Moisturizing Cream  Cetaphil Moisturizing Lotion  Clairol Herbal Essence Moisturizing Lotion, Dry Skin  Clairol Herbal Essence Moisturizing Lotion, Extra Dry Skin  Clairol Herbal Essence Moisturizing Lotion, Normal Skin  Curel Age Defying Therapeutic Moisturizing Lotion with Alpha Hydroxy  Curel Extreme Care Body Lotion  Curel Soothing Hands Moisturizing Hand Lotion  Curel Therapeutic Moisturizing Cream, Fragrance-Free  Curel Therapeutic Moisturizing Lotion, Fragrance-Free  Curel Therapeutic Moisturizing Lotion, Original Formula  Eucerin Daily Replenishing Lotion  Eucerin Dry Skin Therapy Plus Alpha Hydroxy Crme  Eucerin Dry Skin Therapy Plus Alpha Hydroxy Lotion  Eucerin Original Crme  Eucerin Original Lotion  Eucerin Plus Crme Eucerin Plus Lotion  Eucerin TriLipid Replenishing Lotion  Keri Anti-Bacterial Hand Lotion  Keri Deep Conditioning Original Lotion Dry Skin Formula Softly Scented  Keri Deep Conditioning Original Lotion, Fragrance Free Sensitive Skin Formula  Keri Lotion Fast Absorbing Fragrance Free Sensitive Skin Formula  Keri Lotion Fast  Absorbing Softly Scented Dry Skin Formula  Keri Original Lotion  Keri Skin Renewal Lotion Keri Silky Smooth Lotion  Keri Silky Smooth Sensitive Skin Lotion  Nivea Body Creamy Conditioning Oil  Nivea Body Extra Enriched Lotion  Nivea Body Original Lotion  Nivea Body Sheer Moisturizing Lotion Nivea Crme  Nivea Skin Firming Lotion  NutraDerm 30 Skin Lotion  NutraDerm Skin Lotion  NutraDerm Therapeutic Skin Cream  NutraDerm Therapeutic Skin Lotion  ProShield Protective Hand Cream  Provon moisturizing lotion   FAILURE TO FOLLOW THESE INSTRUCTIONS MAY RESULT IN THE CANCELLATION OF YOUR SURGERY  PATIENT SIGNATURE_________________________________  NURSE SIGNATURE__________________________________  ________________________________________________________________________       Kevin Day    An incentive spirometer is a tool that can help keep your lungs clear and active. This tool measures how well you are filling your lungs with each breath. Taking  long deep breaths may help reverse or decrease the chance of developing breathing (pulmonary) problems (especially infection) following: A long period of time when you are unable to move or be active. BEFORE THE PROCEDURE  If the spirometer includes an indicator to show your best effort, your nurse or respiratory therapist will set it to a desired goal. If possible, sit up straight or lean slightly forward. Try not to slouch. Hold the incentive spirometer in an upright position. INSTRUCTIONS FOR USE  Sit on the edge of your bed if possible, or sit up as far as you can in bed or on a chair. Hold the incentive spirometer in an upright position. Breathe out normally. Place the mouthpiece in your mouth and seal your lips tightly around it. Breathe in slowly and as deeply as possible, raising the piston or the ball toward the top of the column. Hold your breath for 3-5 seconds or for as long as possible. Allow the piston or ball  to fall to the bottom of the column. Remove the mouthpiece from your mouth and breathe out normally. Rest for a few seconds and repeat Steps 1 through 7 at least 10 times every 1-2 hours when you are awake. Take your time and take a few normal breaths between deep breaths. The spirometer may include an indicator to show your best effort. Use the indicator as a goal to work toward during each repetition. After each set of 10 deep breaths, practice coughing to be sure your lungs are clear. If you have an incision (the cut made at the time of surgery), support your incision when coughing by placing a pillow or rolled up towels firmly against it. Once you are able to get out of bed, walk around indoors and cough well. You may stop using the incentive spirometer when instructed by your caregiver.  RISKS AND COMPLICATIONS Take your time so you do not get dizzy or light-headed. If you are in pain, you may need to take or ask for pain medication before doing incentive spirometry. It is harder to take a deep breath if you are having pain. AFTER USE Rest and breathe slowly and easily. It can be helpful to keep track of a log of your progress. Your caregiver can provide you with a simple table to help with this. If you are using the spirometer at home, follow these instructions: SEEK MEDICAL CARE IF:  You are having difficultly using the spirometer. You have trouble using the spirometer as often as instructed. Your pain medication is not giving enough relief while using the spirometer. You develop fever of 100.5 F (38.1 C) or higher.                                                                                                    SEEK IMMEDIATE MEDICAL CARE IF:  You cough up bloody sputum that had not been present before. You develop fever of 102 F (38.9 C) or greater. You develop worsening pain at or near the incision site. MAKE SURE YOU:  Understand these instructions. Will watch your  condition.  Will get help right away if you are not doing well or get worse. Document Released: 12/25/2006 Document Revised: 11/06/2011 Document Reviewed: 02/25/2007 [facility] Patient Information 2014 [place], [place]. .       If you would like to see a video about joint replacement:   [email or web address]

## 2024-05-14 NOTE — Progress Notes (Signed)
 COVID Vaccine received:  []  No [x]  Yes Date of any COVID positive Test in last 90 days:  PCP - Massie Sewer, MD clearance scanned to [facility] Cardiologist - Ozell Fell, MD, Josefa Beauvais, NP  03-26-24  cardiac clearance  EP- Soyla Norton, MD   Chest x-ray - 04-01-2021  2v EKG - 09-18-2023  Stress Test -  ECHO - 12-06-2021 Cardiac Cath -  CT Coronary Calcium  score: 881 on  01-02-2022    Pacemaker / ICD device [x]  No []  Yes   Spinal Cord Stimulator:[x]  No []  Yes       History of Sleep Apnea? [x]  No []  Yes   CPAP used?- [x]  No []  Yes    Patient has: [x]  NO Hx DM   []  Pre-DM   []  DM1  []   DM2 Does the patient monitor blood sugar?   [x]  N/A   []  No []  Yes   Blood Thinner / Instructions:Eliquis   Last dose: 05-22-24 Aspirin  Instructions:  none  Dental hx: []  Dentures:  []  N/A      []  Bridge or Partial:                   []  Loose or Damaged teeth:   Comments:   Activity level: Able to walk up 2 flights of stairs without becoming significantly short of breath or having chest pain?  []  No   []    Yes  Patient can perform ADLs without assistance. []  No   []   Yes  Anesthesia review: CHF, HTN, A.fib ? ablation 12-2021, thrombocytopenia, HOH-HAs  Patient denies any S&S of respiratory illness or Covid - no shortness of breath, fever, cough or chest pain at PAT appointment.  Patient verbalized understanding and agreement to the Pre-Surgical Instructions that were given to them at this PAT appointment. Patient was also educated of the need to review these PAT instructions again prior to his surgery.I reviewed the appropriate phone numbers to call if they have any and questions or concerns.

## 2024-05-15 ENCOUNTER — Encounter (HOSPITAL_COMMUNITY): Payer: Self-pay

## 2024-05-15 ENCOUNTER — Encounter (HOSPITAL_COMMUNITY)
Admission: RE | Admit: 2024-05-15 | Discharge: 2024-05-15 | Disposition: A | Discharge status: Home / Self Care | Source: Ambulatory Visit | Attending: Orthopedic Surgery | Admitting: Orthopedic Surgery

## 2024-05-15 ENCOUNTER — Other Ambulatory Visit: Payer: Self-pay

## 2024-05-15 VITALS — BP 112/59 | HR 60 | Temp 97.6°F | Resp 16 | Ht 72.0 in | Wt 208.0 lb

## 2024-05-15 DIAGNOSIS — I110 Hypertensive heart disease with heart failure: Secondary | ICD-10-CM | POA: Insufficient documentation

## 2024-05-15 DIAGNOSIS — I4819 Other persistent atrial fibrillation: Secondary | ICD-10-CM | POA: Insufficient documentation

## 2024-05-15 DIAGNOSIS — D6869 Other thrombophilia: Secondary | ICD-10-CM | POA: Insufficient documentation

## 2024-05-15 DIAGNOSIS — Z8546 Personal history of malignant neoplasm of prostate: Secondary | ICD-10-CM | POA: Diagnosis not present

## 2024-05-15 DIAGNOSIS — I2510 Atherosclerotic heart disease of native coronary artery without angina pectoris: Secondary | ICD-10-CM | POA: Insufficient documentation

## 2024-05-15 DIAGNOSIS — Z01812 Encounter for preprocedural laboratory examination: Secondary | ICD-10-CM | POA: Diagnosis not present

## 2024-05-15 DIAGNOSIS — Z01818 Encounter for other preprocedural examination: Secondary | ICD-10-CM

## 2024-05-15 DIAGNOSIS — Z87891 Personal history of nicotine dependence: Secondary | ICD-10-CM | POA: Diagnosis not present

## 2024-05-15 DIAGNOSIS — M1712 Unilateral primary osteoarthritis, left knee: Secondary | ICD-10-CM | POA: Insufficient documentation

## 2024-05-15 DIAGNOSIS — I509 Heart failure, unspecified: Secondary | ICD-10-CM | POA: Diagnosis not present

## 2024-05-15 HISTORY — DX: Disease of blood and blood-forming organs, unspecified: D75.9

## 2024-05-15 LAB — BASIC METABOLIC PANEL WITH GFR
Anion gap: 12 (ref 5–15)
BUN: 23 mg/dL (ref 8–23)
CO2: 22 mmol/L (ref 22–32)
Calcium: 9.7 mg/dL (ref 8.9–10.3)
Chloride: 105 mmol/L (ref 98–111)
Creatinine, Ser: 1.22 mg/dL (ref 0.61–1.24)
GFR, Estimated: 59 mL/min — ABNORMAL LOW (ref >60)
Glucose, Bld: 93 mg/dL (ref 70–99)
Potassium: 4.1 mmol/L (ref 3.5–5.1)
Sodium: 140 mmol/L (ref 135–145)

## 2024-05-15 LAB — CBC
HCT: 49.8 % (ref 39.0–52.0)
Hemoglobin: 16 g/dL (ref 13.0–17.0)
MCH: 31.1 pg (ref 26.0–34.0)
MCHC: 32.1 g/dL (ref 30.0–36.0)
MCV: 96.7 fL (ref 80.0–100.0)
Platelets: 149 K/uL — ABNORMAL LOW (ref 150–400)
RBC: 5.15 MIL/uL (ref 4.22–5.81)
RDW: 14.7 % (ref 11.5–15.5)
WBC: 6.7 K/uL (ref 4.0–10.5)
nRBC: 0 % (ref 0.0–0.2)

## 2024-05-15 LAB — SURGICAL PCR SCREEN
MRSA, PCR: NEGATIVE
Staphylococcus aureus: NEGATIVE

## 2024-05-16 NOTE — Progress Notes (Signed)
 Anesthesia Chart Review   Case: [id number] Date/Time: 05/26/24 1015   Procedure: ARTHROPLASTY, KNEE, TOTAL (Left: Knee)   Anesthesia type: Choice   Pre-op diagnosis: Left knee osteoarthritis   Location: [facility] ROOM 10 / [facility]   Surgeons: Melodi Lerner, MD       DISCUSSION:83 y.o. former smoker with h/o HTN, a-fib, CAD, CHF, thrombocytopenia, prostate cancer, left knee OA scheduled for above procedure 05/26/2024 with Dr. Lerner Melodi.   Per cardiology preoperative evaluation 03/28/24, Chart reviewed as part of pre-operative protocol coverage. Given past medical history and time since last visit, based on ACC/AHA guidelines, BENFORD ASCH would be at acceptable risk for the planned procedure without further cardiovascular testing.    His RCRI is moderate risk, 6.6% risk of major cardiac event.  He is able to complete greater than 4 METS of physical activity.   Patient has not had an Afib/aflutter ablation within the last 3 months or DCCV within the last 30 days.   Per office protocol, patient can hold Eliquis  for 3 days prior to procedure.   Patient will not need bridging with Lovenox (enoxaparin) around procedure.  Last dose of Eliquis  05/22/2024.   S/p right total knee 08/20/2023, no anesthesia complications noted.  VS: BP (!) 112/59 Comment: right arm sitting  Pulse 60   Temp 36.4 C (Oral)   Resp 16   Ht 6' (1.829 m)   Wt 94.3 kg   SpO2 97%   BMI 28.21 kg/m   PROVIDERS: Valentin Skates, DO is PCP   Cardiologist - Ozell Fell, MD  LABS: Labs reviewed: Acceptable for surgery. (all labs ordered are listed, but only abnormal results are displayed)  Labs Reviewed  BASIC METABOLIC PANEL WITH GFR - Abnormal; Notable for the following components:      Result Value   GFR, Estimated 59 (*)    All other components within normal limits  CBC - Abnormal; Notable for the following components:   Platelets 149 (*)    All other components within normal limits  SURGICAL PCR SCREEN      IMAGES:   EKG:   CV: Echo 12/06/2021   1. Left ventricular ejection fraction, by estimation, is 50 to 55%. The  left ventricle has low normal function. The left ventricle has no regional  wall motion abnormalities. Left ventricular diastolic parameters are  consistent with Grade I diastolic  dysfunction (impaired relaxation).   2. Right ventricular systolic function is normal. The right ventricular  size is normal.   3. The mitral valve is normal in structure. Mild mitral valve  regurgitation.   4. The aortic valve is tricuspid. There is mild calcification of the  aortic valve. There is moderate thickening of the aortic valve. Aortic  valve regurgitation is mild. Aortic valve sclerosis/calcification is  present, without any evidence of aortic  stenosis.   5. Aortic dilatation noted. There is mild dilatation of the aortic root,  measuring 40 mm. There is mild dilatation of the ascending aorta,  measuring 43 mm.   6. The inferior vena cava is normal in size with greater than 50%  respiratory variability, suggesting right atrial pressure of 3 mmHg.   Myocardial Perfusion 03/20/2019 Nuclear stress EF: 50%. No T wave inversion was noted during stress. There was no ST segment deviation noted during stress. This is a low risk study.   No reversible ischemia. LVEF 50% with normal wall motion. This is a low risk study. Past Medical History:  Diagnosis Date   Aortic  atherosclerosis (HCC)    Arthritis    Blood dyscrasia    thrombocytopenia   CAD (coronary artery disease)    Cataracts, bilateral    CHF (congestive heart failure) (HCC)    Dilated aortic root (HCC)    Dysrhythmia    A.fib   has ablation 12-2021   Hyperlipidemia    Hypertension    PAF (paroxysmal atrial fibrillation) (HCC)    a/p ablation   Prostate cancer (HCC)    Ruptured lumbar disc    Skin cancer     Past Surgical History:  Procedure Laterality Date   ATRIAL FIBRILLATION ABLATION N/A 01/06/2022    Procedure: ATRIAL FIBRILLATION ABLATION;  Surgeon: Inocencio Soyla Lunger, MD;  Location: [facility] INVASIVE CV LAB;  Service: Cardiovascular;  Laterality: N/A;   BACK SURGERY     CARDIOVERSION N/A 12/01/2021   Procedure: CARDIOVERSION;  Surgeon: Barbaraann Darryle Ned, MD;  Location: [facility] ENDOSCOPY;  Service: Cardiovascular;  Laterality: N/A;   cataract Bilateral    COLONOSCOPY  10/09/2016   POLYPECTOMY     radioactive      radioactive seed implants for prostate CA.    TOTAL KNEE ARTHROPLASTY Right 08/20/2023   Procedure: TOTAL KNEE ARTHROPLASTY;  Surgeon: Melodi Lerner, MD;  Location: [facility];  Service: Orthopedics;  Laterality: Right;    MEDICATIONS:  apixaban  (ELIQUIS ) 5 MG TABS tablet   Glucosamine HCl (GLUCOSAMINE PO)   metoprolol  tartrate (LOPRESSOR ) 25 MG tablet   Multiple Vitamins-Minerals (OCUVITE EXTRA) TABS   pantoprazole  (PROTONIX ) 20 MG tablet   potassium chloride  (KLOR-CON ) 10 MEQ tablet   rosuvastatin  (CRESTOR ) 20 MG tablet   sacubitril -valsartan  (ENTRESTO ) 24-26 MG   No current facility-administered medications for this encounter.     Harlene Hoots Ward, PA-C [facility] Pre-Surgical Testing [phone]

## 2024-05-16 NOTE — Anesthesia Preprocedure Evaluation (Addendum)
 Anesthesia Evaluation  Patient identified by MRN, date of birth, ID band Patient awake    Reviewed: Allergy & Precautions, NPO status , Patient's Chart, lab work & pertinent test results  Airway Mallampati: III  TM Distance: >3 FB Neck ROM: Full    Dental no notable dental hx.    Pulmonary former smoker   Pulmonary exam normal        Cardiovascular hypertension, Pt. on home beta blockers and Pt. on medications + CAD and +CHF  Normal cardiovascular exam+ dysrhythmias Atrial Fibrillation      Neuro/Psych negative neurological ROS  negative psych ROS   GI/Hepatic negative GI ROS, Neg liver ROS,,,  Endo/Other  negative endocrine ROS    Renal/GU Renal disease     Musculoskeletal  (+) Arthritis ,    Abdominal   Peds  Hematology  (+) Blood dyscrasia (Eliquis )   Anesthesia Other Findings Left knee osteoarthritis  Reproductive/Obstetrics                              Anesthesia Physical Anesthesia Plan  ASA: 3  Anesthesia Plan: Spinal and Regional   Post-op Pain Management: Regional block*   Induction:   PONV Risk Score and Plan: 1 and Ondansetron , Dexamethasone , Propofol  infusion and Treatment may vary due to age or medical condition  Airway Management Planned: Simple Face Mask  Additional Equipment:   Intra-op Plan:   Post-operative Plan:   Informed Consent: I have reviewed the patients History and Physical, chart, labs and discussed the procedure including the risks, benefits and alternatives for the proposed anesthesia with the patient or authorized representative who has indicated his/her understanding and acceptance.     Dental advisory given  Plan Discussed with: CRNA  Anesthesia Plan Comments: (PAT note 05/15/2024)         Anesthesia Quick Evaluation

## 2024-05-26 ENCOUNTER — Other Ambulatory Visit: Payer: Self-pay

## 2024-05-26 ENCOUNTER — Ambulatory Visit (HOSPITAL_COMMUNITY): Payer: Self-pay | Admitting: Physician Assistant

## 2024-05-26 ENCOUNTER — Encounter (HOSPITAL_COMMUNITY)
Admission: RE | Disposition: A | Discharge status: Home / Self Care | Payer: Self-pay | Source: Ambulatory Visit | Attending: Orthopedic Surgery

## 2024-05-26 ENCOUNTER — Observation Stay (HOSPITAL_COMMUNITY)
Admission: RE | Admit: 2024-05-26 | Discharge: 2024-05-27 | Disposition: A | Discharge status: Home / Self Care | Source: Ambulatory Visit | Attending: Orthopedic Surgery | Admitting: Orthopedic Surgery

## 2024-05-26 ENCOUNTER — Encounter (HOSPITAL_COMMUNITY): Payer: Self-pay | Admitting: Orthopedic Surgery

## 2024-05-26 ENCOUNTER — Ambulatory Visit (HOSPITAL_BASED_OUTPATIENT_CLINIC_OR_DEPARTMENT_OTHER): Admitting: Certified Registered"

## 2024-05-26 DIAGNOSIS — M1712 Unilateral primary osteoarthritis, left knee: Secondary | ICD-10-CM

## 2024-05-26 DIAGNOSIS — I2510 Atherosclerotic heart disease of native coronary artery without angina pectoris: Secondary | ICD-10-CM | POA: Insufficient documentation

## 2024-05-26 DIAGNOSIS — Z8546 Personal history of malignant neoplasm of prostate: Secondary | ICD-10-CM | POA: Diagnosis not present

## 2024-05-26 DIAGNOSIS — Z85828 Personal history of other malignant neoplasm of skin: Secondary | ICD-10-CM | POA: Diagnosis not present

## 2024-05-26 DIAGNOSIS — I110 Hypertensive heart disease with heart failure: Secondary | ICD-10-CM | POA: Insufficient documentation

## 2024-05-26 DIAGNOSIS — Z96651 Presence of right artificial knee joint: Secondary | ICD-10-CM | POA: Diagnosis not present

## 2024-05-26 DIAGNOSIS — Z87891 Personal history of nicotine dependence: Secondary | ICD-10-CM | POA: Diagnosis not present

## 2024-05-26 DIAGNOSIS — I509 Heart failure, unspecified: Secondary | ICD-10-CM

## 2024-05-26 DIAGNOSIS — G8918 Other acute postprocedural pain: Secondary | ICD-10-CM | POA: Diagnosis not present

## 2024-05-26 DIAGNOSIS — Z7901 Long term (current) use of anticoagulants: Secondary | ICD-10-CM | POA: Diagnosis not present

## 2024-05-26 DIAGNOSIS — M179 Osteoarthritis of knee, unspecified: Principal | ICD-10-CM | POA: Diagnosis present

## 2024-05-26 DIAGNOSIS — Z79899 Other long term (current) drug therapy: Secondary | ICD-10-CM | POA: Insufficient documentation

## 2024-05-26 DIAGNOSIS — I480 Paroxysmal atrial fibrillation: Secondary | ICD-10-CM | POA: Diagnosis not present

## 2024-05-26 DIAGNOSIS — M25562 Pain in left knee: Secondary | ICD-10-CM | POA: Diagnosis present

## 2024-05-26 SURGERY — ARTHROPLASTY, KNEE, TOTAL
Anesthesia: Regional | Site: Knee | Laterality: Left

## 2024-05-26 MED ORDER — FENTANYL CITRATE PF 50 MCG/ML IJ SOSY: 25.0000 ug | PREFILLED_SYRINGE | INTRAMUSCULAR | Status: DC | PRN

## 2024-05-26 MED ORDER — APIXABAN 2.5 MG PO TABS
2.5000 mg | ORAL_TABLET | Freq: Two times a day (BID) | ORAL | Status: DC
  Administered 2024-05-27: 2.5 mg via ORAL
  Filled 2024-05-26: qty 1

## 2024-05-26 MED ORDER — BUPIVACAINE IN DEXTROSE 0.75-8.25 % IT SOLN
INTRATHECAL | Status: DC | PRN
  Administered 2024-05-26: 1.6 mL via INTRATHECAL

## 2024-05-26 MED ORDER — ACETAMINOPHEN 325 MG PO TABS: 325.0000 mg | ORAL_TABLET | Freq: Four times a day (QID) | ORAL | Status: DC | PRN

## 2024-05-26 MED ORDER — HYDROMORPHONE HCL 1 MG/ML IJ SOLN: 0.5000 mg | INTRAMUSCULAR | Status: DC | PRN

## 2024-05-26 MED ORDER — PROPOFOL 500 MG/50ML IV EMUL
INTRAVENOUS | Status: DC | PRN
  Administered 2024-05-26: 50 ug/kg/min via INTRAVENOUS

## 2024-05-26 MED ORDER — PROPOFOL 10 MG/ML IV BOLUS
INTRAVENOUS | Status: DC | PRN
  Administered 2024-05-26: 20 mg via INTRAVENOUS
  Administered 2024-05-26 (×2): 10 mg via INTRAVENOUS

## 2024-05-26 MED ORDER — EPHEDRINE SULFATE-NACL 50-0.9 MG/10ML-% IV SOSY
PREFILLED_SYRINGE | INTRAVENOUS | Status: DC | PRN
  Administered 2024-05-26: 10 mg via INTRAVENOUS

## 2024-05-26 MED ORDER — ORAL CARE MOUTH RINSE: 15.0000 mL | Freq: Once | OROMUCOSAL | Status: AC

## 2024-05-26 MED ORDER — ONDANSETRON HCL 4 MG PO TABS: 4.0000 mg | ORAL_TABLET | Freq: Four times a day (QID) | ORAL | Status: DC | PRN

## 2024-05-26 MED ORDER — METHOCARBAMOL 500 MG PO TABS
500.0000 mg | ORAL_TABLET | Freq: Four times a day (QID) | ORAL | Status: DC | PRN
  Administered 2024-05-26: 500 mg via ORAL
  Filled 2024-05-26: qty 1

## 2024-05-26 MED ORDER — TRANEXAMIC ACID-NACL 1000-0.7 MG/100ML-% IV SOLN
1000.0000 mg | INTRAVENOUS | Status: AC
Start: 2024-05-26 — End: 2024-05-26
  Administered 2024-05-26: 1000 mg via INTRAVENOUS
  Filled 2024-05-26: qty 100

## 2024-05-26 MED ORDER — ONDANSETRON HCL 4 MG/2ML IJ SOLN: 4.0000 mg | Freq: Once | INTRAMUSCULAR | Status: DC | PRN

## 2024-05-26 MED ORDER — POTASSIUM CHLORIDE ER 10 MEQ PO TBCR
20.0000 meq | EXTENDED_RELEASE_TABLET | Freq: Every day | ORAL | Status: DC
  Administered 2024-05-27: 20 meq via ORAL
  Filled 2024-05-26 (×2): qty 2

## 2024-05-26 MED ORDER — FENTANYL CITRATE (PF) 100 MCG/2ML IJ SOLN
INTRAMUSCULAR | Status: AC
  Filled 2024-05-26: qty 2

## 2024-05-26 MED ORDER — ACETAMINOPHEN 500 MG PO TABS
1000.0000 mg | ORAL_TABLET | Freq: Four times a day (QID) | ORAL | Status: DC
  Administered 2024-05-26 – 2024-05-27 (×3): 1000 mg via ORAL
  Filled 2024-05-26 (×3): qty 2

## 2024-05-26 MED ORDER — METHOCARBAMOL 1000 MG/10ML IJ SOLN: 500.0000 mg | Freq: Four times a day (QID) | INTRAMUSCULAR | Status: DC | PRN

## 2024-05-26 MED ORDER — MENTHOL 3 MG MT LOZG: 1.0000 | LOZENGE | OROMUCOSAL | Status: DC | PRN

## 2024-05-26 MED ORDER — MIDAZOLAM HCL 2 MG/2ML IJ SOLN
1.0000 mg | Freq: Once | INTRAMUSCULAR | Status: DC
  Filled 2024-05-26: qty 2

## 2024-05-26 MED ORDER — SODIUM CHLORIDE 0.9 % IR SOLN
Status: DC | PRN
  Administered 2024-05-26: 1000 mL

## 2024-05-26 MED ORDER — DIPHENHYDRAMINE HCL 12.5 MG/5ML PO ELIX: 12.5000 mg | ORAL_SOLUTION | ORAL | Status: DC | PRN

## 2024-05-26 MED ORDER — PROPOFOL 10 MG/ML IV BOLUS
INTRAVENOUS | Status: AC
  Filled 2024-05-26: qty 20

## 2024-05-26 MED ORDER — SODIUM CHLORIDE (PF) 0.9 % IJ SOLN
INTRAMUSCULAR | Status: AC
  Filled 2024-05-26: qty 10

## 2024-05-26 MED ORDER — OXYCODONE HCL 5 MG PO TABS
5.0000 mg | ORAL_TABLET | ORAL | Status: DC | PRN
  Administered 2024-05-26 – 2024-05-27 (×3): 5 mg via ORAL
  Filled 2024-05-26 (×3): qty 1

## 2024-05-26 MED ORDER — METOCLOPRAMIDE HCL 5 MG PO TABS: 5.0000 mg | ORAL_TABLET | Freq: Three times a day (TID) | ORAL | Status: DC | PRN

## 2024-05-26 MED ORDER — DEXAMETHASONE SODIUM PHOSPHATE 10 MG/ML IJ SOLN
10.0000 mg | Freq: Once | INTRAMUSCULAR | Status: AC
  Administered 2024-05-27: 10 mg via INTRAVENOUS
  Filled 2024-05-26: qty 1

## 2024-05-26 MED ORDER — DEXAMETHASONE SODIUM PHOSPHATE 10 MG/ML IJ SOLN
8.0000 mg | Freq: Once | INTRAMUSCULAR | Status: AC
  Administered 2024-05-26: 8 mg via INTRAVENOUS

## 2024-05-26 MED ORDER — LIDOCAINE HCL (PF) 2 % IJ SOLN
INTRAMUSCULAR | Status: AC
  Filled 2024-05-26: qty 5

## 2024-05-26 MED ORDER — AMISULPRIDE (ANTIEMETIC) 5 MG/2ML IV SOLN: 10.0000 mg | Freq: Once | INTRAVENOUS | Status: DC | PRN

## 2024-05-26 MED ORDER — STERILE WATER FOR IRRIGATION IR SOLN
Status: DC | PRN
  Administered 2024-05-26: 2000 mL

## 2024-05-26 MED ORDER — TRAMADOL HCL 50 MG PO TABS
50.0000 mg | ORAL_TABLET | Freq: Four times a day (QID) | ORAL | Status: DC | PRN
  Administered 2024-05-26 – 2024-05-27 (×3): 50 mg via ORAL
  Filled 2024-05-26 (×3): qty 1

## 2024-05-26 MED ORDER — FENTANYL CITRATE PF 50 MCG/ML IJ SOSY
50.0000 ug | PREFILLED_SYRINGE | Freq: Once | INTRAMUSCULAR | Status: AC
Start: 2024-05-26 — End: 2024-05-26
  Administered 2024-05-26: 50 ug via INTRAVENOUS
  Filled 2024-05-26: qty 2

## 2024-05-26 MED ORDER — PHENOL 1.4 % MT LIQD: 1.0000 | OROMUCOSAL | Status: DC | PRN

## 2024-05-26 MED ORDER — CEFAZOLIN SODIUM-DEXTROSE 2-4 GM/100ML-% IV SOLN
2.0000 g | Freq: Four times a day (QID) | INTRAVENOUS | Status: AC
  Administered 2024-05-26 (×2): 2 g via INTRAVENOUS
  Filled 2024-05-26 (×2): qty 100

## 2024-05-26 MED ORDER — METOPROLOL TARTRATE 25 MG PO TABS
25.0000 mg | ORAL_TABLET | Freq: Two times a day (BID) | ORAL | Status: DC
  Administered 2024-05-26 – 2024-05-27 (×2): 25 mg via ORAL
  Filled 2024-05-26 (×2): qty 1

## 2024-05-26 MED ORDER — METOCLOPRAMIDE HCL 5 MG/ML IJ SOLN: 5.0000 mg | Freq: Three times a day (TID) | INTRAMUSCULAR | Status: DC | PRN

## 2024-05-26 MED ORDER — POLYETHYLENE GLYCOL 3350 17 G PO PACK: 17.0000 g | PACK | Freq: Every day | ORAL | Status: DC | PRN

## 2024-05-26 MED ORDER — ACETAMINOPHEN 10 MG/ML IV SOLN
1000.0000 mg | Freq: Once | INTRAVENOUS | Status: AC
  Administered 2024-05-26: 1000 mg via INTRAVENOUS
  Filled 2024-05-26: qty 100

## 2024-05-26 MED ORDER — CHLORHEXIDINE GLUCONATE 0.12 % MT SOLN
15.0000 mL | Freq: Once | OROMUCOSAL | Status: AC
  Administered 2024-05-26: 15 mL via OROMUCOSAL

## 2024-05-26 MED ORDER — DOCUSATE SODIUM 100 MG PO CAPS
100.0000 mg | ORAL_CAPSULE | Freq: Two times a day (BID) | ORAL | Status: DC
  Administered 2024-05-26 – 2024-05-27 (×2): 100 mg via ORAL
  Filled 2024-05-26 (×2): qty 1

## 2024-05-26 MED ORDER — ONDANSETRON HCL 4 MG/2ML IJ SOLN: 4.0000 mg | Freq: Four times a day (QID) | INTRAMUSCULAR | Status: DC | PRN

## 2024-05-26 MED ORDER — 0.9 % SODIUM CHLORIDE (POUR BTL) OPTIME
TOPICAL | Status: DC | PRN
  Administered 2024-05-26: 1000 mL

## 2024-05-26 MED ORDER — FLEET ENEMA RE ENEM: 1.0000 | ENEMA | Freq: Once | RECTAL | Status: DC | PRN

## 2024-05-26 MED ORDER — BUPIVACAINE LIPOSOME 1.3 % IJ SUSP: 20.0000 mL | Freq: Once | INTRAMUSCULAR | Status: DC

## 2024-05-26 MED ORDER — LACTATED RINGERS IV SOLN
INTRAVENOUS | Status: DC
Start: 2024-05-26 — End: 2024-05-26

## 2024-05-26 MED ORDER — CEFAZOLIN SODIUM-DEXTROSE 2-4 GM/100ML-% IV SOLN
2.0000 g | INTRAVENOUS | Status: AC
  Administered 2024-05-26: 2 g via INTRAVENOUS
  Filled 2024-05-26: qty 100

## 2024-05-26 MED ORDER — BUPIVACAINE-EPINEPHRINE (PF) 0.5% -1:200000 IJ SOLN
INTRAMUSCULAR | Status: DC | PRN
  Administered 2024-05-26: 30 mL via PERINEURAL

## 2024-05-26 MED ORDER — BUPIVACAINE LIPOSOME 1.3 % IJ SUSP
INTRAMUSCULAR | Status: AC
  Filled 2024-05-26: qty 20

## 2024-05-26 MED ORDER — SODIUM CHLORIDE (PF) 0.9 % IJ SOLN
INTRAMUSCULAR | Status: AC
  Filled 2024-05-26: qty 50

## 2024-05-26 MED ORDER — FENTANYL CITRATE (PF) 250 MCG/5ML IJ SOLN
INTRAMUSCULAR | Status: DC | PRN
  Administered 2024-05-26 (×2): 50 ug via INTRAVENOUS

## 2024-05-26 MED ORDER — SODIUM CHLORIDE 0.9 % IV SOLN: INTRAVENOUS | Status: DC

## 2024-05-26 MED ORDER — LACTATED RINGERS IV SOLN: INTRAVENOUS | Status: DC

## 2024-05-26 MED ORDER — SODIUM CHLORIDE (PF) 0.9 % IJ SOLN
INTRAMUSCULAR | Status: DC | PRN
  Administered 2024-05-26: 80 mL

## 2024-05-26 MED ORDER — ROSUVASTATIN CALCIUM 20 MG PO TABS
20.0000 mg | ORAL_TABLET | Freq: Every day | ORAL | Status: DC
  Administered 2024-05-27: 20 mg via ORAL
  Filled 2024-05-26: qty 1

## 2024-05-26 MED ORDER — SACUBITRIL-VALSARTAN 24-26 MG PO TABS
1.0000 | ORAL_TABLET | Freq: Two times a day (BID) | ORAL | Status: DC
  Administered 2024-05-26 – 2024-05-27 (×2): 1 via ORAL
  Filled 2024-05-26 (×2): qty 1

## 2024-05-26 MED ORDER — BISACODYL 10 MG RE SUPP: 10.0000 mg | Freq: Every day | RECTAL | Status: DC | PRN

## 2024-05-26 MED ORDER — POVIDONE-IODINE 10 % EX SWAB: 2.0000 | Freq: Once | CUTANEOUS | Status: DC

## 2024-05-26 SURGICAL SUPPLY — 43 items
ATTUNE MED DOME PAT 41 KNEE (Knees) IMPLANT
ATTUNE PS FEM LT SZ 8 CEM KNEE (Femur) IMPLANT
ATTUNE PSRP INSR SZ8 8 KNEE (Insert) IMPLANT
BAG COUNTER SPONGE SURGICOUNT (BAG) IMPLANT
BAG ZIPLOCK 12X15 (MISCELLANEOUS) ×2 IMPLANT
BASE TIBIAL ROT PLAT SZ 8 KNEE (Knees) IMPLANT
BLADE SAG 18X100X1.27 (BLADE) ×2 IMPLANT
BLADE SAW SGTL 11.0X1.19X90.0M (BLADE) ×2 IMPLANT
BNDG ELASTIC 6INX 5YD STR LF (GAUZE/BANDAGES/DRESSINGS) ×2 IMPLANT
BOWL SMART MIX CTS (DISPOSABLE) ×2 IMPLANT
CEMENT HV SMART SET (Cement) ×4 IMPLANT
COVER SURGICAL LIGHT HANDLE (MISCELLANEOUS) ×2 IMPLANT
CUFF TRNQT CYL 34X4.125X (TOURNIQUET CUFF) ×2 IMPLANT
DERMABOND ADVANCED .7 DNX12 (GAUZE/BANDAGES/DRESSINGS) ×2 IMPLANT
DRAPE U-SHAPE 47X51 STRL (DRAPES) ×2 IMPLANT
DRSG AQUACEL AG ADV 3.5X10 (GAUZE/BANDAGES/DRESSINGS) ×2 IMPLANT
DURAPREP 26ML APPLICATOR (WOUND CARE) ×2 IMPLANT
ELECT REM PT RETURN 15FT ADLT (MISCELLANEOUS) ×2 IMPLANT
GLOVE BIO SURGEON STRL SZ 6.5 (GLOVE) IMPLANT
GLOVE BIO SURGEON STRL SZ7 (GLOVE) IMPLANT
GLOVE BIO SURGEON STRL SZ8 (GLOVE) ×2 IMPLANT
GLOVE BIOGEL PI IND STRL 7.0 (GLOVE) ×2 IMPLANT
GLOVE BIOGEL PI IND STRL 8 (GLOVE) ×2 IMPLANT
GOWN STRL REUS W/ TWL LRG LVL3 (GOWN DISPOSABLE) ×2 IMPLANT
HOLDER FOLEY CATH W/STRAP (MISCELLANEOUS) ×2 IMPLANT
IMMOBILIZER KNEE 20 THIGH 36 (SOFTGOODS) ×2 IMPLANT
KIT TURNOVER KIT A (KITS) ×2 IMPLANT
MANIFOLD NEPTUNE II (INSTRUMENTS) ×2 IMPLANT
NS IRRIG 1000ML POUR BTL (IV SOLUTION) ×2 IMPLANT
PACK TOTAL KNEE CUSTOM (KITS) ×2 IMPLANT
PADDING CAST COTTON 6X4 STRL (CAST SUPPLIES) ×4 IMPLANT
PENCIL SMOKE EVACUATOR (MISCELLANEOUS) ×2 IMPLANT
PIN STEINMAN FIXATION KNEE (PIN) IMPLANT
PROTECTOR NERVE ULNAR (MISCELLANEOUS) ×2 IMPLANT
SET HNDPC FAN SPRY TIP SCT (DISPOSABLE) ×2 IMPLANT
SUT MNCRL AB 4-0 PS2 18 (SUTURE) ×2 IMPLANT
SUT VIC AB 2-0 CT1 TAPERPNT 27 (SUTURE) ×6 IMPLANT
SUTURE STRATFX 0 PDS 27 VIOLET (SUTURE) ×2 IMPLANT
TOWEL GREEN STERILE FF (TOWEL DISPOSABLE) ×2 IMPLANT
TRAY FOLEY MTR SLVR 16FR STAT (SET/KITS/TRAYS/PACK) ×2 IMPLANT
TUBE SUCTION HIGH CAP CLEAR NV (SUCTIONS) ×2 IMPLANT
WATER STERILE IRR 1000ML POUR (IV SOLUTION) ×4 IMPLANT
WRAP KNEE MAXI GEL POST OP (GAUZE/BANDAGES/DRESSINGS) ×2 IMPLANT

## 2024-05-26 NOTE — Anesthesia Procedure Notes (Signed)
 Anesthesia Regional Block: Adductor canal block   Pre-Anesthetic Checklist: , timeout performed,  Correct Patient, Correct Site, Correct Laterality,  Correct Procedure,, site marked,  Risks and benefits discussed,  Surgical consent,  Pre-op evaluation,  At surgeon's request and post-op pain management  Laterality: Left  Prep: chloraprep       Needles:  Injection technique: Single-shot  Needle Type: Echogenic Stimulator Needle     Needle Length: 10cm  Needle Gauge: 20     Additional Needles:   Procedures:,,,, ultrasound used (permanent image in chart),,    Narrative:  Start time: 05/26/2024 9:40 AM End time: 05/26/2024 9:50 AM Injection made incrementally with aspirations every 5 mL.  Performed by: Personally  Anesthesiologist: Patrisha Bernardino SQUIBB, MD  Additional Notes: Functioning IV was confirmed and monitors were applied. A time-out was performed. Hand hygiene and sterile gloves were used. The thigh was placed in a frog-leg position and prepped in a sterile fashion. A 20ga Bbraun echogenic stimulator needle was placed using ultrasound guidance.  Negative aspiration and negative test dose prior to incremental administration of local anesthetic. The patient tolerated the procedure well.

## 2024-05-26 NOTE — Transfer of Care (Signed)
 Immediate Anesthesia Transfer of Care Note  Patient: Kevin Day  Procedure(s) Performed: ARTHROPLASTY, KNEE, TOTAL (Left: Knee)  Patient Location: PACU  Anesthesia Type:Spinal and MAC combined with regional for post-op pain  Level of Consciousness: drowsy and patient cooperative  Airway & Oxygen Therapy: Patient Spontanous Breathing  Post-op Assessment: Report given to RN and Post -op Vital signs reviewed and stable  Post vital signs: Reviewed and stable  Last Vitals:  Vitals Value Taken Time  BP 104/58 05/26/24 12:15  Temp    Pulse 64 05/26/24 12:16  Resp 13 05/26/24 12:16  SpO2 93 % 05/26/24 12:16  Vitals shown include unfiled device data.  Last Pain:  Vitals:   05/26/24 0950  TempSrc:   PainSc: 0-No pain      Patients Stated Pain Goal: 4 (05/26/24 0809)  Complications: No notable events documented.

## 2024-05-26 NOTE — Interval H&P Note (Signed)
 History and Physical Interval Note:  05/26/2024 8:30 AM  Kevin Day  has presented today for surgery, with the diagnosis of Left knee osteoarthritis.  The various methods of treatment have been discussed with the patient and family. After consideration of risks, benefits and other options for treatment, the patient has consented to  Procedure(s): ARTHROPLASTY, KNEE, TOTAL (Left) as a surgical intervention.  The patient's history has been reviewed, patient examined, no change in status, stable for surgery.  I have reviewed the patient's chart and labs.  Questions were answered to the patient's satisfaction.     [name]

## 2024-05-26 NOTE — Op Note (Signed)
 OPERATIVE REPORT-TOTAL KNEE ARTHROPLASTY   Pre-operative diagnosis- Osteoarthritis  Left knee(s)  Post-operative diagnosis- Osteoarthritis Left knee(s)  Procedure-  Left  Total Knee Arthroplasty  Surgeon- Dempsey GAILS. Jolin Benavides, MD  Assistant- Randine Ricks, PA-C   Anesthesia-  Adductor canal block and spinal  EBL- 25 ml   Drains None  Tourniquet time-  Total Tourniquet Time Documented: Thigh (Left) - 42 minutes Total: Thigh (Left) - 42 minutes     Complications- None  Condition-PACU - hemodynamically stable.   Brief Clinical Note   Kevin Day is a 83 y.o. year old male with end stage OA of his left knee with progressively worsening pain and dysfunction. He has constant pain, with activity and at rest and significant functional deficits with difficulties even with ADLs. He has had extensive non-op management including analgesics, injections of cortisone and viscosupplements, and home exercise program, but remains in significant pain with significant dysfunction. Radiographs show bone on bone arthritis medial and patellofemoral. He presents now for left Total Knee Arthroplasty.     Procedure in detail---   The patient is brought into the operating room and positioned supine on the operating table. After successful administration of  Adductor canal block and spinal,   a tourniquet is placed high on the  Left thigh(s) and the lower extremity is prepped and draped in the usual sterile fashion. Time out is performed by the operating team and then the  Left lower extremity is wrapped in Esmarch, knee flexed and the tourniquet inflated to 300 mmHg.       A midline incision is made with a ten blade through the subcutaneous tissue to the level of the extensor mechanism. A fresh blade is used to make a medial parapatellar arthrotomy. Soft tissue over the proximal medial tibia is subperiosteally elevated to the joint line with a knife and into the semimembranosus bursa with a Cobb elevator.  Soft tissue over the proximal lateral tibia is elevated with attention being paid to avoiding the patellar tendon on the tibial tubercle. The patella is everted, knee flexed 90 degrees and the ACL and PCL are removed. Findings are bone on bone medial and patellofemoral with large global osteophytes        The drill is used to create a starting hole in the distal femur and the canal is thoroughly irrigated with sterile saline to remove the fatty contents. The 5 degree Left  valgus alignment guide is placed into the femoral canal and the distal femoral cutting block is pinned to remove 10 mm off the distal femur. Resection is made with an oscillating saw.      The tibia is subluxed forward and the menisci are removed. The extramedullary alignment guide is placed referencing proximally at the medial aspect of the tibial tubercle and distally along the second metatarsal axis and tibial crest. The block is pinned to remove 2mm off the more deficient medial  side. Resection is made with an oscillating saw. Size 8is the most appropriate size for the tibia and the proximal tibia is prepared with the modular drill and keel punch for that size.      The femoral sizing guide is placed and size 8 is most appropriate. Rotation is marked off the epicondylar axis and confirmed by creating a rectangular flexion gap at 90 degrees. The size 8 cutting block is pinned in this rotation and the anterior, posterior and chamfer cuts are made with the oscillating saw. The intercondylar block is then placed and that cut  is made.      Trial size 8 tibial component, trial size 8 posterior stabilized femur and a 8  mm posterior stabilized rotating platform insert trial is placed. Full extension is achieved with excellent varus/valgus and anterior/posterior balance throughout full range of motion. The patella is everted and thickness measured to be 27  mm. Free hand resection is taken to 15 mm, a 41 template is placed, lug holes are drilled,  trial patella is placed, and it tracks normally. Osteophytes are removed off the posterior femur with the trial in place. All trials are removed and the cut bone surfaces prepared with pulsatile lavage. Cement is mixed and once ready for implantation, the size 8 tibial implant, size  8 posterior stabilized femoral component, and the size 41 patella are cemented in place and the patella is held with the clamp. The trial insert is placed and the knee held in full extension. The Exparel  (20 ml mixed with 60 ml saline) is injected into the extensor mechanism, posterior capsule, medial and lateral gutters and subcutaneous tissues.  All extruded cement is removed and once the cement is hard the permanent 8 mm posterior stabilized rotating platform insert is placed into the tibial tray.      The wound is copiously irrigated with saline solution and the extensor mechanism closed with # 0 Stratofix suture. The tourniquet is released for a total tourniquet time of 42  minutes. Flexion against gravity is 140 degrees and the patella tracks normally. Subcutaneous tissue is closed with 2.0 vicryl and subcuticular with running 4.0 Monocryl. The incision is cleaned and dried and steri-strips and a bulky sterile dressing are applied. The limb is placed into a knee immobilizer and the patient is awakened and transported to recovery in stable condition.      Please note that a surgical assistant was a medical necessity for this procedure in order to perform it in a safe and expeditious manner. Surgical assistant was necessary to retract the ligaments and vital neurovascular structures to prevent injury to them and also necessary for proper positioning of the limb to allow for anatomic placement of the prosthesis.   Dempsey ROCKFORD Nakita Santerre, MD    05/26/2024, 11:44 AM

## 2024-05-26 NOTE — Care Plan (Signed)
 Ortho Bundle Case Management Note  Patient Details  Name: Kevin Day MRN: [id number] Date of Birth: 07/20/1941  L TKA on 05/26/24  DCP: Home with wife   DME: No needs  PT: EO                   DME Arranged:  N/A DME Agency:  NA  HH Arranged:    HH Agency:     Additional Comments: Please contact me with any questions of if this plan should need to change.  Lyle Pepper, CCM [facility] [phone]   05/26/2024, 10:41 AM

## 2024-05-26 NOTE — Discharge Instructions (Signed)
 Frank Aluisio, MD Total Joint Specialist [facility] [place] [address] [place], [place] [address] [phone]  TOTAL KNEE REPLACEMENT POSTOPERATIVE DIRECTIONS    Knee Rehabilitation, Guidelines Following Surgery  Results after knee surgery are often greatly improved when you follow the exercise, range of motion and muscle strengthening exercises prescribed by your doctor. Safety measures are also important to protect the knee from further injury. If any of these exercises cause you to have increased pain or swelling in your knee joint, decrease the amount until you are comfortable again and slowly increase them. If you have problems or questions, call your caregiver or physical therapist for advice.   HOME CARE INSTRUCTIONS  Remove items at home which could result in a fall. This includes throw rugs or furniture in walking pathways.  ICE to the affected knee as much as tolerated. Icing helps control swelling. If the swelling is well controlled you will be more comfortable and rehab easier. Continue to use ice on the knee for pain and swelling from surgery. You may notice swelling that will progress down to the foot and ankle. This is normal after surgery. Elevate the leg when you are not up walking on it.    Continue to use the breathing machine which will help keep your temperature down. It is common for your temperature to cycle up and down following surgery, especially at night when you are not up moving around and exerting yourself. The breathing machine keeps your lungs expanded and your temperature down. Do not place pillow under the operative knee, focus on keeping the knee straight while resting  DIET You may resume your previous home diet once you are discharged from the hospital.  DRESSING / WOUND CARE / SHOWERING Keep your bulky bandage on for 2 days. On the third post-operative day you may remove the Ace bandage and gauze. There is a waterproof  adhesive bandage on your skin which will stay in place until your first follow-up appointment. Once you remove this you will not need to place another bandage You may begin showering 3 days following surgery, but do not submerge the incision under water.  ACTIVITY For the first 5 days, the key is rest and control of pain and swelling Do your home exercises twice a day starting on post-operative day 3. On the days you go to physical therapy, just do the home exercises once that day. You should rest, ice and elevate the leg for 50 minutes out of every hour. Get up and walk/stretch for 10 minutes per hour. After 5 days you can increase your activity slowly as tolerated. Walk with your walker as instructed. Use the walker until you are comfortable transitioning to a cane. Walk with the cane in the opposite hand of the operative leg. You may discontinue the cane once you are comfortable and walking steadily. Avoid periods of inactivity such as sitting longer than an hour when not asleep. This helps prevent blood clots.  You may discontinue the knee immobilizer once you are able to perform a straight leg raise while lying down. You may resume a sexual relationship in one month or when given the OK by your doctor.  You may return to work once you are cleared by your doctor.  Do not drive a car for 6 weeks or until released by your surgeon.  Do not drive while taking narcotics.  TED HOSE STOCKINGS Wear the elastic stockings on both legs for three weeks following surgery during the   day. You may remove them at night for sleeping.  WEIGHT BEARING Weight bearing as tolerated with assist device (walker, cane, etc) as directed, use it as long as suggested by your surgeon or therapist, typically at least 4-6 weeks.  POSTOPERATIVE CONSTIPATION PROTOCOL Constipation - defined medically as fewer than three stools per week and severe constipation as less than one stool per week.  One of the most common issues  patients have following surgery is constipation.  Even if you have a regular bowel pattern at home, your normal regimen is likely to be disrupted due to multiple reasons following surgery.  Combination of anesthesia, postoperative narcotics, change in appetite and fluid intake all can affect your bowels.  In order to avoid complications following surgery, here are some recommendations in order to help you during your recovery period.  Colace (docusate) - Pick up an over-the-counter form of Colace or another stool softener and take twice a day as long as you are requiring postoperative pain medications.  Take with a full glass of water daily.  If you experience loose stools or diarrhea, hold the colace until you stool forms back up. If your symptoms do not get better within 1 week or if they get worse, check with your doctor. Dulcolax (bisacodyl) - Pick up over-the-counter and take as directed by the product packaging as needed to assist with the movement of your bowels.  Take with a full glass of water.  Use this product as needed if not relieved by Colace only.  MiraLax (polyethylene glycol) - Pick up over-the-counter to have on hand. MiraLax is a solution that will increase the amount of water in your bowels to assist with bowel movements.  Take as directed and can mix with a glass of water, juice, soda, coffee, or tea. Take if you go more than two days without a movement. Do not use MiraLax more than once per day. Call your doctor if you are still constipated or irregular after using this medication for 7 days in a row.  If you continue to have problems with postoperative constipation, please contact the office for further assistance and recommendations.  If you experience "the worst abdominal pain ever" or develop nausea or vomiting, please contact the office immediatly for further recommendations for treatment.  ITCHING If you experience itching with your medications, try taking only a single pain  pill, or even half a pain pill at a time.  You can also use Benadryl over the counter for itching or also to help with sleep.   MEDICATIONS See your medication summary on the "After Visit Summary" that the nursing staff will review with you prior to discharge.  You may have some home medications which will be placed on hold until you complete the course of blood thinner medication.  It is important for you to complete the blood thinner medication as prescribed by your surgeon.  Continue your approved medications as instructed at time of discharge.  PRECAUTIONS If you experience chest pain or shortness of breath - call 911 immediately for transfer to the hospital emergency department.  If you develop a fever greater that 101 F, purulent drainage from wound, increased redness or drainage from wound, foul odor from the wound/dressing, or calf pain - CONTACT YOUR SURGEON.                                                     FOLLOW-UP APPOINTMENTS Make sure you keep all of your appointments after your operation with your surgeon and caregivers. You should call the office at the above phone number and make an appointment for approximately two weeks after the date of your surgery or on the date instructed by your surgeon outlined in the "After Visit Summary".  RANGE OF MOTION AND STRENGTHENING EXERCISES  Rehabilitation of the knee is important following a knee injury or an operation. After just a few days of immobilization, the muscles of the thigh which control the knee become weakened and shrink (atrophy). Knee exercises are designed to build up the tone and strength of the thigh muscles and to improve knee motion. Often times heat used for twenty to thirty minutes before working out will loosen up your tissues and help with improving the range of motion but do not use heat for the first two weeks following surgery. These exercises can be done on a training (exercise) mat, on the floor, on a table or on a bed.  Use what ever works the best and is most comfortable for you Knee exercises include:  Leg Lifts - While your knee is still immobilized in a splint or cast, you can do straight leg raises. Lift the leg to 60 degrees, hold for 3 sec, and slowly lower the leg. Repeat 10-20 times 2-3 times daily. Perform this exercise against resistance later as your knee gets better.  Quad and Hamstring Sets - Tighten up the muscle on the front of the thigh (Quad) and hold for 5-10 sec. Repeat this 10-20 times hourly. Hamstring sets are done by pushing the foot backward against an object and holding for 5-10 sec. Repeat as with quad sets.  Leg Slides: Lying on your back, slowly slide your foot toward your buttocks, bending your knee up off the floor (only go as far as is comfortable). Then slowly slide your foot back down until your leg is flat on the floor again. Angel Wings: Lying on your back spread your legs to the side as far apart as you can without causing discomfort.  A rehabilitation program following serious knee injuries can speed recovery and prevent re-injury in the future due to weakened muscles. Contact your doctor or a physical therapist for more information on knee rehabilitation.   POST-OPERATIVE OPIOID TAPER INSTRUCTIONS: It is important to wean off of your opioid medication as soon as possible. If you do not need pain medication after your surgery it is ok to stop day one. Opioids include: Codeine, Hydrocodone(Norco, Vicodin), Oxycodone(Percocet, oxycontin) and hydromorphone amongst others.  Long term and even short term use of opiods can cause: Increased pain response Dependence Constipation Depression Respiratory depression And more.  Withdrawal symptoms can include Flu like symptoms Nausea, vomiting And more Techniques to manage these symptoms Hydrate well Eat regular healthy meals Stay active Use relaxation techniques(deep breathing, meditating, yoga) Do Not substitute Alcohol to help  with tapering If you have been on opioids for less than two weeks and do not have pain than it is ok to stop all together.  Plan to wean off of opioids This plan should start within one week post op of your joint replacement. Maintain the same interval or time between taking each dose and first decrease the dose.  Cut the total daily intake of opioids by one tablet each day Next start to increase the time between doses. The last dose that should be eliminated is the evening dose.   IF YOU ARE TRANSFERRED TO   A SKILLED REHAB FACILITY If the patient is transferred to a skilled rehab facility following release from the hospital, a list of the current medications will be sent to the facility for the patient to continue.  When discharged from the skilled rehab facility, please have the facility set up the patient's Home Health Physical Therapy prior to being released. Also, the skilled facility will be responsible for providing the patient with their medications at time of release from the facility to include their pain medication, the muscle relaxants, and their blood thinner medication. If the patient is still at the rehab facility at time of the two week follow up appointment, the skilled rehab facility will also need to assist the patient in arranging follow up appointment in our office and any transportation needs.  MAKE SURE YOU:  Understand these instructions.  Get help right away if you are not doing well or get worse.   DENTAL ANTIBIOTICS:  In most cases prophylactic antibiotics for Dental procdeures after total joint surgery are not necessary.  Exceptions are as follows:  1. History of prior total joint infection  2. Severely immunocompromised (Organ Transplant, cancer chemotherapy, Rheumatoid biologic meds such as Humera)  3. Poorly controlled diabetes (A1C &gt; 8.0, blood glucose over 200)  If you have one of these conditions, contact your surgeon for an antibiotic prescription,  prior to your dental procedure.    Pick up stool softner and laxative for home use following surgery while on pain medications. Do not submerge incision under water. Please use good hand washing techniques while changing dressing each day. May shower starting three days after surgery. Please use a clean towel to pat the incision dry following showers. Continue to use ice for pain and swelling after surgery. Do not use any lotions or creams on the incision until instructed by your surgeon.  

## 2024-05-26 NOTE — Anesthesia Procedure Notes (Signed)
 Spinal  Patient location during procedure: OR Start time: 05/26/2024 10:35 AM End time: 05/26/2024 10:41 AM Reason for block: surgical anesthesia Staffing Performed: anesthesiologist  Anesthesiologist: Patrisha Bernardino SQUIBB, MD Performed by: Patrisha Bernardino SQUIBB, MD Authorized by: Patrisha Bernardino SQUIBB, MD   Preanesthetic Checklist Completed: patient identified, IV checked, risks and benefits discussed, surgical consent, monitors and equipment checked, pre-op evaluation and timeout performed Spinal Block Patient position: sitting Prep: DuraPrep Patient monitoring: cardiac monitor, continuous pulse ox and blood pressure Approach: midline Location: L3-4 Injection technique: single-shot Needle Needle type: Quincke  Needle gauge: 22 G Needle length: 9 cm Assessment Sensory level: T10 Events: CSF return and second provider Additional Notes Functioning IV was confirmed and monitors were applied. Sterile prep and drape, including hand hygiene and sterile gloves were used. The patient was positioned and the spine was prepped. The skin was anesthetized with lidocaine .  Unsuccessful attempts by CRNA. Free flow of clear CSF was obtained prior to injecting local anesthetic into the CSF.  The spinal needle aspirated freely following injection.  The needle was carefully withdrawn.  The patient tolerated the procedure well.

## 2024-05-26 NOTE — Evaluation (Signed)
 Physical Therapy Evaluation Patient Details Name: Kevin Day MRN: [id number] DOB: [birth year] Today's Date: 05/26/2024  History of Present Illness  Pt is 83 yo male admitted 05/26/24 for L TKA.  Pt with hx including but not limited to R TKA, afib, prostate cancer, skin cancer, back surgery, cardioversion, ablation 12/2021.  Clinical Impression  Pt is s/p TKA resulting in the deficits listed below (see PT Problem List). At baseline, pt is independent.  He has home support, DME, and only 1 step to enter.  Today, pt with good quad activation, pain control, and ROM.  He was able to tolerate ambulating 74' with RW well.  Pt expected to progress well with therapy.  Pt will benefit from acute skilled PT to increase their independence and safety with mobility to allow discharge.          If plan is discharge home, recommend the following: A little help with walking and/or transfers;A little help with bathing/dressing/bathroom;Assistance with cooking/housework;Help with stairs or ramp for entrance   Can travel by private vehicle        Equipment Recommendations None recommended by PT  Recommendations for Other Services       Functional Status Assessment Patient has had a recent decline in their functional status and demonstrates the ability to make significant improvements in function in a reasonable and predictable amount of time.     Precautions / Restrictions Precautions Precautions: Fall Required Braces or Orthoses: Knee Immobilizer - Left Knee Immobilizer - Left: Discontinue once straight leg raise with < 10 degree lag Restrictions Weight Bearing Restrictions Per Provider Order: Yes LLE Weight Bearing Per Provider Order: Weight bearing as tolerated      Mobility  Bed Mobility Overal bed mobility: Needs Assistance Bed Mobility: Supine to Sit, Sit to Supine     Supine to sit: Contact guard Sit to supine: Contact guard assist        Transfers Overall transfer level: Needs  assistance Equipment used: Rolling walker (2 wheels) Transfers: Sit to/from Stand Sit to Stand: Contact guard assist, From elevated surface                Ambulation/Gait Ambulation/Gait assistance: Contact guard assist Gait Distance (Feet): 75 Feet Assistive device: Rolling walker (2 wheels) Gait Pattern/deviations: Step-to pattern, Decreased weight shift to left Gait velocity: decreased but functional     General Gait Details: cues for sequencing and RW proximity; steady gait, tolerated well  Stairs            Wheelchair Mobility     Tilt Bed    Modified Rankin (Stroke Patients Only)       Balance Overall balance assessment: Needs assistance Sitting-balance support: No upper extremity supported Sitting balance-Leahy Scale: Good     Standing balance support: Bilateral upper extremity supported, Reliant on assistive device for balance Standing balance-Leahy Scale: Poor Standing balance comment: steady wtih RW                             Pertinent Vitals/Pain Pain Assessment Pain Assessment: 0-10 Pain Score: 4  Pain Location: L knee Pain Descriptors / Indicators: Discomfort Pain Intervention(s): Limited activity within patient's tolerance, Monitored during session, Repositioned, Ice applied, RN gave pain meds during session    Home Living Family/patient expects to be discharged to:: Private residence Living Arrangements: Spouse/significant other Available Help at Discharge: Family Type of Home: House Home Access: Stairs to enter Entrance Stairs-Rails: None Entrance Stairs-Number of  Steps: 1   Home Layout: One level Home Equipment: Agricultural consultant (2 wheels);BSC/3in1;Shower seat;Grab bars - tub/shower      Prior Function Prior Level of Function : Independent/Modified Independent;Driving             Mobility Comments: no falls, walks without AD; could ambuilate in community ADLs Comments: independent     Extremity/Trunk  Assessment   Upper Extremity Assessment Upper Extremity Assessment: Overall WFL for tasks assessed    Lower Extremity Assessment Lower Extremity Assessment: LLE deficits/detail;RLE deficits/detail RLE Deficits / Details: ROM WFL; MMT 5/5 LLE Deficits / Details: Expected post op changes; ROM Knee 5 to 80 degrees; MMT: ankle 5/5, knee and hip 3/5 not further tested; able to SLR without LAG    Cervical / Trunk Assessment Cervical / Trunk Assessment: Normal  Communication        Cognition Arousal: Alert Behavior During Therapy: WFL for tasks assessed/performed   PT - Cognitive impairments: No apparent impairments                                 Cueing       General Comments General comments (skin integrity, edema, etc.): VSS    Exercises     Assessment/Plan    PT Assessment Patient needs continued PT services  PT Problem List Decreased strength;Decreased mobility;Decreased range of motion;Decreased activity tolerance;Decreased balance;Pain;Decreased knowledge of use of DME       PT Treatment Interventions DME instruction;Therapeutic exercise;Gait training;Stair training;Functional mobility training;Therapeutic activities;Patient/family education;Modalities;Balance training    PT Goals (Current goals can be found in the Care Plan section)  Acute Rehab PT Goals Patient Stated Goal: return home PT Goal Formulation: With patient/family Time For Goal Achievement: 06/09/24 Potential to Achieve Goals: Good    Frequency 7X/week     Co-evaluation               AM-PAC PT 6 Clicks Mobility  Outcome Measure Help needed turning from your back to your side while in a flat bed without using bedrails?: A Little Help needed moving from lying on your back to sitting on the side of a flat bed without using bedrails?: A Little Help needed moving to and from a bed to a chair (including a wheelchair)?: A Little Help needed standing up from a chair using your arms  (e.g., wheelchair or bedside chair)?: A Little Help needed to walk in hospital room?: A Little Help needed climbing 3-5 steps with a railing? : A Little 6 Click Score: 18    End of Session Equipment Utilized During Treatment: Gait belt Activity Tolerance: Patient tolerated treatment well Patient left: in bed;with family/visitor present;with call bell/phone within reach;with SCD's reapplied (family reports issues with edema last time so wanted to elevate LE well - used trendlenberg feature in order to keep knee straight) Nurse Communication: Mobility status PT Visit Diagnosis: Other abnormalities of gait and mobility (R26.89);Muscle weakness (generalized) (M62.81)    Time: 1721-1746 PT Time Calculation (min) (ACUTE ONLY): 25 min   Charges:   PT Evaluation $PT Eval Low Complexity: 1 Low PT Treatments $Gait Training: 8-22 mins PT General Charges $$ ACUTE PT VISIT: 1 Visit         Kevin, PT Acute Rehab [facility] [phone]   Kevin Day 05/26/2024, 5:57 PM

## 2024-05-26 NOTE — Progress Notes (Signed)
 Orthopedic Tech Progress Note Patient Details:  [name] 1941/05/08 [id number]  Patient ID: Kevin Day Ada, male   DOB: Jan 08, 1941, 83 y.o.   MRN: [id number] Removed pt from CPM.  Morna Pink 05/26/2024, 4:44 PM

## 2024-05-26 NOTE — Progress Notes (Signed)
 Orthopedic Tech Progress Note Patient Details:  Kevin Day [birth year] [id number] Applied CPM per order. Will remove at 4:35 pm.  CPM Left Knee CPM Left Knee: On Left Knee Flexion (Degrees): 40 Left Knee Extension (Degrees): 10  Post Interventions Patient Tolerated: Well Instructions Provided: Adjustment of device, Care of device, Poper ambulation with device Ortho Devices Type of Ortho Device: CPM padding Ortho Device/Splint Location: LLE Ortho Device/Splint Interventions: Ordered, Application, Adjustment   Post Interventions Patient Tolerated: Well Instructions Provided: Adjustment of device, Care of device, Poper ambulation with device  Morna Pink 05/26/2024, 12:37 PM

## 2024-05-26 NOTE — Anesthesia Postprocedure Evaluation (Signed)
 Anesthesia Post Note  Patient: YOGI ARTHER  Procedure(s) Performed: ARTHROPLASTY, KNEE, TOTAL (Left: Knee)     Patient location during evaluation: PACU Anesthesia Type: Regional and Spinal Level of consciousness: awake Pain management: pain level controlled Vital Signs Assessment: post-procedure vital signs reviewed and stable Respiratory status: spontaneous breathing, nonlabored ventilation and respiratory function stable Cardiovascular status: blood pressure returned to baseline and stable Postop Assessment: no apparent nausea or vomiting Anesthetic complications: no   No notable events documented.  Last Vitals:  Vitals:   05/26/24 1420 05/26/24 1520  BP: (!) 146/69 (!) 146/69  Pulse: (!) 59 (!) 59  Resp: 16 16  Temp: 36.6 C 36.6 C  SpO2: 97%     Last Pain:  Vitals:   05/26/24 1520  TempSrc: Oral  PainSc:                  Karen Kinnard P Clydia Nieves

## 2024-05-27 ENCOUNTER — Encounter (HOSPITAL_COMMUNITY): Payer: Self-pay | Admitting: Orthopedic Surgery

## 2024-05-27 ENCOUNTER — Other Ambulatory Visit: Payer: Self-pay

## 2024-05-27 ENCOUNTER — Other Ambulatory Visit (HOSPITAL_COMMUNITY): Payer: Self-pay

## 2024-05-27 DIAGNOSIS — M1712 Unilateral primary osteoarthritis, left knee: Secondary | ICD-10-CM | POA: Diagnosis not present

## 2024-05-27 LAB — BASIC METABOLIC PANEL WITH GFR
Anion gap: 14 (ref 5–15)
BUN: 18 mg/dL (ref 8–23)
CO2: 21 mmol/L — ABNORMAL LOW (ref 22–32)
Calcium: 9 mg/dL (ref 8.9–10.3)
Chloride: 101 mmol/L (ref 98–111)
Creatinine, Ser: 1.41 mg/dL — ABNORMAL HIGH (ref 0.61–1.24)
GFR, Estimated: 50 mL/min — ABNORMAL LOW (ref >60)
Glucose, Bld: 124 mg/dL — ABNORMAL HIGH (ref 70–99)
Potassium: 4.4 mmol/L (ref 3.5–5.1)
Sodium: 137 mmol/L (ref 135–145)

## 2024-05-27 LAB — CBC
HCT: 44.5 % (ref 39.0–52.0)
Hemoglobin: 14.7 g/dL (ref 13.0–17.0)
MCH: 31.3 pg (ref 26.0–34.0)
MCHC: 33 g/dL (ref 30.0–36.0)
MCV: 94.7 fL (ref 80.0–100.0)
Platelets: 161 K/uL (ref 150–400)
RBC: 4.7 MIL/uL (ref 4.22–5.81)
RDW: 13.9 % (ref 11.5–15.5)
WBC: 15.8 K/uL — ABNORMAL HIGH (ref 4.0–10.5)
nRBC: 0 % (ref 0.0–0.2)

## 2024-05-27 MED ORDER — METHOCARBAMOL 500 MG PO TABS
500.0000 mg | ORAL_TABLET | Freq: Four times a day (QID) | ORAL | 0 refills | Status: AC | PRN
  Filled 2024-05-27: qty 40, 10d supply, fill #0

## 2024-05-27 MED ORDER — PROPOFOL 500 MG/50ML IV EMUL
INTRAVENOUS | Status: AC
  Filled 2024-05-27: qty 50

## 2024-05-27 MED ORDER — OXYCODONE HCL 5 MG PO TABS
5.0000 mg | ORAL_TABLET | Freq: Four times a day (QID) | ORAL | 0 refills | Status: DC | PRN
  Filled 2024-05-27: qty 42, 6d supply, fill #0

## 2024-05-27 MED ORDER — PROPOFOL 1000 MG/100ML IV EMUL
INTRAVENOUS | Status: AC
  Filled 2024-05-27: qty 300

## 2024-05-27 MED ORDER — ONDANSETRON HCL 4 MG PO TABS
4.0000 mg | ORAL_TABLET | Freq: Four times a day (QID) | ORAL | 0 refills | Status: DC | PRN
  Filled 2024-05-27: qty 20, 5d supply, fill #0

## 2024-05-27 MED ORDER — TRAMADOL HCL 50 MG PO TABS
50.0000 mg | ORAL_TABLET | Freq: Four times a day (QID) | ORAL | 0 refills | Status: DC | PRN
  Filled 2024-05-27: qty 40, 5d supply, fill #0

## 2024-05-27 NOTE — Progress Notes (Signed)
 Subjective: 1 Day Post-Op Procedure(s) (LRB): ARTHROPLASTY, KNEE, TOTAL (Left) Patient reports pain as mild.   Patient seen in rounds by Dr. Melodi. Patient is well, and has had no acute complaints or problems No issues overnight. Denies chest pain, SOB, or calf pain. Foley catheter removed this AM.  We will continue therapy today, ambulated 32' yesterday.   Objective: Vital signs in last 24 hours: Temp:  [97.5 F (36.4 C)-98.2 F (36.8 C)] 97.8 F (36.6 C) (09/30 0446) Pulse Rate:  [52-86] 71 (09/30 0446) Resp:  [11-19] 14 (09/30 0446) BP: (104-152)/(58-73) 131/67 (09/30 0446) SpO2:  [93 %-100 %] 96 % (09/30 0446) Weight:  [94.3 kg] 94.3 kg (09/29 1520)  Intake/Output from previous day:  Intake/Output Summary (Last 24 hours) at 05/27/2024 0902 Last data filed at 05/27/2024 0811 Gross per 24 hour  Intake 1742.5 ml  Output 3050 ml  Net -1307.5 ml     Intake/Output this shift: Total I/O In: 100 [P.O.:100] Out: -   Labs: Recent Labs    05/27/24 0340  HGB 14.7   Recent Labs    05/27/24 0340  WBC 15.8*  RBC 4.70  HCT 44.5  PLT 161   Recent Labs    05/27/24 0340  NA 137  K 4.4  CL 101  CO2 21*  BUN 18  CREATININE 1.41*  GLUCOSE 124*  CALCIUM  9.0   No results for input(s): LABPT, INR in the last 72 hours.  Exam: General - Patient is Alert and Oriented Extremity - Neurologically intact Neurovascular intact Sensation intact distally Dorsiflexion/Plantar flexion intact Dressing - dressing C/D/I Motor Function - intact, moving foot and toes well on exam.   Past Medical History:  Diagnosis Date   Aortic atherosclerosis    Arthritis    Blood dyscrasia    thrombocytopenia   CAD (coronary artery disease)    Cataracts, bilateral    CHF (congestive heart failure) (HCC)    Dilated aortic root    Dysrhythmia    A.fib   has ablation 12-2021   Hyperlipidemia    Hypertension    PAF (paroxysmal atrial fibrillation) (HCC)    a/p ablation    Prostate cancer (HCC)    Ruptured lumbar disc    Skin cancer     Assessment/Plan: 1 Day Post-Op Procedure(s) (LRB): ARTHROPLASTY, KNEE, TOTAL (Left) Principal Problem:   OA (osteoarthritis) of knee Active Problems:   Primary osteoarthritis of left knee  Estimated body mass index is 28.21 kg/m as calculated from the following:   Height as of this encounter: 6' (1.829 m).   Weight as of this encounter: 94.3 kg. Advance diet Up with therapy D/C IV fluids   Patient's anticipated LOS is less than 2 midnights, meeting these requirements: - Lives within 1 hour of care - Has a competent adult at home to recover with post-op recover - NO history of  - Chronic pain requiring opiods  - Diabetes  - Coronary Artery Disease  - Heart attack  - Stroke  - DVT/VTE  - Respiratory Failure/COPD  - Renal failure  - Anemia  - Advanced Liver disease   DVT Prophylaxis - Eliquis  Weight bearing as tolerated. Continue therapy.  Plan is to go Home after hospital stay. Plan for discharge later today if progresses with therapy and meeting goals. Scheduled for OPPT at [facility]. Follow-up in the office in 2 weeks.  The PDMP database was reviewed today prior to any opioid medications being prescribed to this patient.  Roxie Mess, PA-C Orthopedic Surgery (  [phone] 05/27/2024, 9:02 AM

## 2024-05-27 NOTE — Progress Notes (Signed)
 Discharge medications delivered to patient at bedside in a secure bag.

## 2024-05-27 NOTE — TOC Transition Note (Signed)
 Transition of Care [facility]) - Discharge Note   Patient Details  Name: Kevin Day MRN: [id number] Date of Birth: 11/24/1940  Transition of Care [facility]) CM/SW Contact:  NORMAN ASPEN, LCSW Phone Number: 05/27/2024, 10:05 AM   Clinical Narrative:     Met with pt who confirms he has needed DME in the home.  OPPT already arranged with [facility].  No further IP CM needs.   Final next level of care: OP Rehab Barriers to Discharge: No Barriers Identified   Patient Goals and CMS Choice Patient states their goals for this hospitalization and ongoing recovery are:: return home          Discharge Placement                       Discharge Plan and Services Additional resources added to the After Visit Summary for                  DME Arranged: N/A DME Agency: NA                  Social Drivers of Health (SDOH) Interventions SDOH Screenings   Food Insecurity: No Food Insecurity (05/26/2024)  Housing: Low Risk  (05/26/2024)  Transportation Needs: No Transportation Needs (05/26/2024)  Utilities: Not At Risk (05/26/2024)  Social Connections: Moderately Isolated (05/26/2024)  Tobacco Use: Medium Risk (05/26/2024)     Readmission Risk Interventions     No data to display

## 2024-05-27 NOTE — Progress Notes (Signed)
 Physical Therapy Treatment Patient Details Name: Kevin Day MRN: [id number] DOB: 12-22-1940 Today's Date: 05/27/2024   History of Present Illness Pt is 83 yo male admitted 05/26/24 for L TKA.  Pt with hx including but not limited to R TKA, afib, prostate cancer, skin cancer, back surgery, cardioversion, ablation 12/2021.    PT Comments  Pt is POD # 1 and is progressing well.  Pt with excellent quad activation, ROM, and pain control.  He was able to ambulate 150' and performed steps similar to home set up.  Pt has DME , home support, and outpt PT scheduled. Pt demonstrates safe gait & transfers in order to return home from PT perspective once discharged by MD.  While in hospital, will continue to benefit from PT for skilled therapy to advance mobility and exercises.       If plan is discharge home, recommend the following: A little help with walking and/or transfers;A little help with bathing/dressing/bathroom;Assistance with cooking/housework;Help with stairs or ramp for entrance   Can travel by private vehicle        Equipment Recommendations  None recommended by PT    Recommendations for Other Services       Precautions / Restrictions Precautions Precautions: Fall Restrictions LLE Weight Bearing Per Provider Order: Weight bearing as tolerated     Mobility  Bed Mobility Overal bed mobility: Needs Assistance Bed Mobility: Supine to Sit     Supine to sit: Supervision     General bed mobility comments: self assist with gait belt    Transfers Overall transfer level: Needs assistance Equipment used: Rolling walker (2 wheels) Transfers: Sit to/from Stand Sit to Stand: Supervision           General transfer comment: STS x 3 during session    Ambulation/Gait Ambulation/Gait assistance: Supervision Gait Distance (Feet): 150 Feet Assistive device: Rolling walker (2 wheels) Gait Pattern/deviations: Step-through pattern Gait velocity: decreased but functional      General Gait Details: min cues for posture; steady gait ; only slight decrease weight shift to L   Stairs Stairs: Yes Stairs assistance: Contact guard assist Stair Management: Step to pattern, Forwards, With walker Number of Stairs: 1 General stair comments: educated on sequencing; tolerated well   Wheelchair Mobility     Tilt Bed    Modified Rankin (Stroke Patients Only)       Balance Overall balance assessment: Needs assistance Sitting-balance support: No upper extremity supported Sitting balance-Leahy Scale: Good     Standing balance support: Bilateral upper extremity supported, No upper extremity supported Standing balance-Leahy Scale: Fair Standing balance comment: steady wtih RW to ambulate; could static stand without UE support                            Communication    Cognition Arousal: Alert Behavior During Therapy: WFL for tasks assessed/performed   PT - Cognitive impairments: No apparent impairments                                Cueing    Exercises Total Joint Exercises Ankle Circles/Pumps: AROM, Both, 10 reps, Supine Quad Sets: AROM, Both, 10 reps, Supine Heel Slides: AAROM, Left, 5 reps, Supine Hip ABduction/ADduction: AAROM, Left, 10 reps, Supine Long Arc Quad: AROM, Left, 10 reps, Seated Knee Flexion: AROM, Left, 10 reps, Seated Goniometric ROM: L knee 5 to 88 degrees    General  Comments   Educated on safe ice use, no pivots, car transfers, resting with leg straight, and TED hose during day. Also, encouraged walking every 1-2 hours during day. Educated on HEP with focus on mobility the first weeks. Discussed doing exercises within pain control and if pain increasing could decreased ROM, reps, and stop exercises as needed. Encouraged to perform quad sets and ankle pumps frequently for blood flow and to promote full knee extension.   Pt able to urinate during session -notified RN     Pertinent Vitals/Pain Pain  Assessment Pain Assessment: 0-10 Pain Score: 1  Pain Location: L knee Pain Descriptors / Indicators: Discomfort Pain Intervention(s): Limited activity within patient's tolerance, Monitored during session, Premedicated before session, Repositioned, Ice applied    Home Living                          Prior Function            PT Goals (current goals can now be found in the care plan section) Progress towards PT goals: Progressing toward goals    Frequency    7X/week      PT Plan      Co-evaluation              AM-PAC PT 6 Clicks Mobility   Outcome Measure  Help needed turning from your back to your side while in a flat bed without using bedrails?: None Help needed moving from lying on your back to sitting on the side of a flat bed without using bedrails?: A Little Help needed moving to and from a bed to a chair (including a wheelchair)?: A Little Help needed standing up from a chair using your arms (e.g., wheelchair or bedside chair)?: A Little Help needed to walk in hospital room?: A Little Help needed climbing 3-5 steps with a railing? : A Little 6 Click Score: 19    End of Session Equipment Utilized During Treatment: Gait belt   Patient left: in bed;with family/visitor present;with call bell/phone within reach (EOB getting dressed) Nurse Communication: Mobility status PT Visit Diagnosis: Other abnormalities of gait and mobility (R26.89);Muscle weakness (generalized) (M62.81)     Time: 8991-8967 PT Time Calculation (min) (ACUTE ONLY): 24 min  Charges:    $Gait Training: 8-22 mins $Therapeutic Exercise: 8-22 mins PT General Charges $$ ACUTE PT VISIT: 1 Visit                     Kevin, PT Acute Rehab Services [facility] [phone]    Kevin Day 05/27/2024, 11:19 AM

## 2024-05-27 NOTE — Progress Notes (Signed)
 Pt's daughter will return after 1430 to pick up oxycodone  at [facility]- coming from [place].

## 2024-05-29 DIAGNOSIS — M25562 Pain in left knee: Secondary | ICD-10-CM | POA: Diagnosis not present

## 2024-05-29 NOTE — Discharge Summary (Signed)
 Patient ID: Kevin Day MRN: [id number] DOB/AGE: [birth year] 83 y.o.  Admit date: 05/26/2024 Discharge date: 05/27/2024  Admission Diagnoses:  Principal Problem:   OA (osteoarthritis) of knee Active Problems:   Primary osteoarthritis of left knee   Discharge Diagnoses:  Same  Past Medical History:  Diagnosis Date   Aortic atherosclerosis    Arthritis    Blood dyscrasia    thrombocytopenia   CAD (coronary artery disease)    Cataracts, bilateral    CHF (congestive heart failure) (HCC)    Dilated aortic root    Dysrhythmia    A.fib   has ablation 12-2021   Hyperlipidemia    Hypertension    PAF (paroxysmal atrial fibrillation) (HCC)    a/p ablation   Prostate cancer (HCC)    Ruptured lumbar disc    Skin cancer     Surgeries: Procedure(s): ARTHROPLASTY, KNEE, TOTAL on 05/26/2024   Consultants:   Discharged Condition: Improved  Hospital Course: DOZIER BERKOVICH is an 83 y.o. male who was admitted 05/26/2024 for operative treatment ofOA (osteoarthritis) of knee. Patient has severe unremitting pain that affects sleep, daily activities, and work/hobbies. After pre-op clearance the patient was taken to the operating room on 05/26/2024 and underwent  Procedure(s): ARTHROPLASTY, KNEE, TOTAL.    Patient was given perioperative antibiotics:  Anti-infectives (From admission, onward)    Start     Dose/Rate Route Frequency Ordered Stop   05/26/24 1700  ceFAZolin  (ANCEF ) IVPB 2g/100 mL premix        2 g 200 mL/hr over 30 Minutes Intravenous Every 6 hours 05/26/24 1445 05/27/24 0814   05/26/24 0815  ceFAZolin  (ANCEF ) IVPB 2g/100 mL premix        2 g 200 mL/hr over 30 Minutes Intravenous On call to O.R. 05/26/24 0813 05/26/24 1045        Patient was given sequential compression devices, early ambulation, and chemoprophylaxis to prevent DVT.  Patient benefited maximally from hospital stay and there were no complications.    Recent vital signs: No data found.   Recent laboratory  studies:  Recent Labs    05/27/24 0340  WBC 15.8*  HGB 14.7  HCT 44.5  PLT 161  NA 137  K 4.4  CL 101  CO2 21*  BUN 18  CREATININE 1.41*  GLUCOSE 124*  CALCIUM  9.0     Discharge Medications:   Allergies as of 05/27/2024   No Known Allergies      Medication List     STOP taking these medications    pantoprazole  20 MG tablet Commonly known as: PROTONIX        TAKE these medications    apixaban  5 MG Tabs tablet Commonly known as: Eliquis  Take 1 tablet (5 mg total) by mouth 2 (two) times daily.   Entresto  24-26 MG Generic drug: sacubitril -valsartan  TAKE 1 TABLET BY MOUTH TWICE DAILY   GLUCOSAMINE PO Take 2 tablets by mouth daily.   methocarbamol  500 MG tablet Commonly known as: ROBAXIN  Take 1 tablet (500 mg total) by mouth every 6 (six) hours as needed for muscle spasms.   metoprolol  tartrate 25 MG tablet Commonly known as: LOPRESSOR  TAKE 1 TABLET(25 MG) BY MOUTH TWICE DAILY   Ocuvite Extra Tabs Take 1 tablet by mouth daily.   ondansetron  4 MG tablet Commonly known as: ZOFRAN  Take 1 tablet (4 mg total) by mouth every 6 (six) hours as needed for nausea.   oxyCODONE  5 MG immediate release tablet Commonly known as: Oxy IR/ROXICODONE  Take 1-2 tablets (  5-10 mg total) by mouth every 6 (six) hours as needed for severe pain (pain score 7-10).   potassium chloride  10 MEQ tablet Commonly known as: KLOR-CON  Take 2 tablets (20 mEq total) by mouth daily.   rosuvastatin  20 MG tablet Commonly known as: CRESTOR  TAKE 1 TABLET(20 MG) BY MOUTH DAILY   traMADol  50 MG tablet Commonly known as: ULTRAM  Take 1-2 tablets (50-100 mg total) by mouth every 6 (six) hours as needed for moderate pain (pain score 4-6).               Discharge Care Instructions  (From admission, onward)           Start     Ordered   05/27/24 0000  Weight bearing as tolerated        05/27/24 0903   05/27/24 0000  Change dressing       Comments: You may remove the bulky  bandage (ACE wrap and gauze) two days after surgery. You will have an adhesive waterproof bandage underneath. Leave this in place until your first follow-up appointment.   05/27/24 0903            Diagnostic Studies: No results found.  Disposition: Discharge disposition: 01-Home or Self Care       Discharge Instructions     Call MD / Call 911   Complete by: As directed    If you experience chest pain or shortness of breath, CALL 911 and be transported to the hospital emergency room.  If you develope a fever above 101 F, pus (white drainage) or increased drainage or redness at the wound, or calf pain, call your surgeon's office.   Change dressing   Complete by: As directed    You may remove the bulky bandage (ACE wrap and gauze) two days after surgery. You will have an adhesive waterproof bandage underneath. Leave this in place until your first follow-up appointment.   Constipation Prevention   Complete by: As directed    Drink plenty of fluids.  Prune juice may be helpful.  You may use a stool softener, such as Colace (over the counter) 100 mg twice a day.  Use MiraLax  (over the counter) for constipation as needed.   Diet - low sodium heart healthy   Complete by: As directed    Do not put a pillow under the knee. Place it under the heel.   Complete by: As directed    Driving restrictions   Complete by: As directed    No driving for two weeks   Post-operative opioid taper instructions:   Complete by: As directed    POST-OPERATIVE OPIOID TAPER INSTRUCTIONS: It is important to wean off of your opioid medication as soon as possible. If you do not need pain medication after your surgery it is ok to stop day one. Opioids include: Codeine, Hydrocodone(Norco, Vicodin), Oxycodone (Percocet, oxycontin ) and hydromorphone amongst others.  Long term and even short term use of opiods can cause: Increased pain response Dependence Constipation Depression Respiratory depression And  more.  Withdrawal symptoms can include Flu like symptoms Nausea, vomiting And more Techniques to manage these symptoms Hydrate well Eat regular healthy meals Stay active Use relaxation techniques(deep breathing, meditating, yoga) Do Not substitute Alcohol  to help with tapering If you have been on opioids for less than two weeks and do not have pain than it is ok to stop all together.  Plan to wean off of opioids This plan should start within one week post op of your  joint replacement. Maintain the same interval or time between taking each dose and first decrease the dose.  Cut the total daily intake of opioids by one tablet each day Next start to increase the time between doses. The last dose that should be eliminated is the evening dose.      TED hose   Complete by: As directed    Use stockings (TED hose) for three weeks on both leg(s).  You may remove them at night for sleeping.   Weight bearing as tolerated   Complete by: As directed         Follow-up Information     Melodi Lerner, MD. Go on 06/10/2024.   Specialty: Orthopedic Surgery Why: You are scheduled for a post op appointment on Tuesday 06/10/24 at 1:30pm Contact information: [address] [place] [address] [phone]         [facility] Go on 05/29/2024.   Why: You are scheduled for physical therapy Thursday 05/29/24 at 9:40am; [address] Contact information: [address] [place] [address] [phone]                  Signed: Roxie Mess 05/29/2024, 12:06 PM

## 2024-06-02 DIAGNOSIS — M25562 Pain in left knee: Secondary | ICD-10-CM | POA: Diagnosis not present

## 2024-06-04 DIAGNOSIS — M25562 Pain in left knee: Secondary | ICD-10-CM | POA: Diagnosis not present

## 2024-06-06 DIAGNOSIS — M25562 Pain in left knee: Secondary | ICD-10-CM | POA: Diagnosis not present

## 2024-06-09 DIAGNOSIS — M25562 Pain in left knee: Secondary | ICD-10-CM | POA: Diagnosis not present

## 2024-06-11 DIAGNOSIS — M25562 Pain in left knee: Secondary | ICD-10-CM | POA: Diagnosis not present

## 2024-06-13 DIAGNOSIS — M25562 Pain in left knee: Secondary | ICD-10-CM | POA: Diagnosis not present

## 2024-06-17 DIAGNOSIS — M25562 Pain in left knee: Secondary | ICD-10-CM | POA: Diagnosis not present

## 2024-06-19 DIAGNOSIS — M25562 Pain in left knee: Secondary | ICD-10-CM | POA: Diagnosis not present

## 2024-06-20 DIAGNOSIS — H35372 Puckering of macula, left eye: Secondary | ICD-10-CM | POA: Diagnosis not present

## 2024-06-20 DIAGNOSIS — H401132 Primary open-angle glaucoma, bilateral, moderate stage: Secondary | ICD-10-CM | POA: Diagnosis not present

## 2024-06-24 DIAGNOSIS — M25562 Pain in left knee: Secondary | ICD-10-CM | POA: Diagnosis not present

## 2024-06-26 DIAGNOSIS — M25562 Pain in left knee: Secondary | ICD-10-CM | POA: Diagnosis not present

## 2024-07-01 DIAGNOSIS — Z5189 Encounter for other specified aftercare: Secondary | ICD-10-CM | POA: Diagnosis not present

## 2024-07-16 ENCOUNTER — Other Ambulatory Visit: Payer: Self-pay

## 2024-07-16 ENCOUNTER — Ambulatory Visit: Attending: Dermatology | Admitting: Dermatology

## 2024-07-16 DIAGNOSIS — L219 Seborrheic dermatitis, unspecified: Secondary | ICD-10-CM | POA: Diagnosis present

## 2024-07-16 DIAGNOSIS — L578 Other skin changes due to chronic exposure to nonionizing radiation: Secondary | ICD-10-CM | POA: Diagnosis present

## 2024-07-16 DIAGNOSIS — Z85820 Personal history of malignant melanoma of skin: Secondary | ICD-10-CM | POA: Diagnosis not present

## 2024-07-16 DIAGNOSIS — D229 Melanocytic nevi, unspecified: Secondary | ICD-10-CM | POA: Diagnosis present

## 2024-07-16 NOTE — Progress Notes (Signed)
 Dermatology Note    Eric Kemp is a 83 year old male  here for skin check  Last Visit 01/30/2024   History of Present Illness    - History of melanoma on the back, status post excision in 2017.   - No new suspicious lesions or spots identified    Pruritus and scaling of the face  - Itching and flaking localized to the nasal cheek  - Uses a prescribed topical cream for this condition, though the name is unknown  - intermittent facial pruritus      Review of Systems: See HPI.   No other dermatologic symptoms    Relevant Derm History  Melanoma, likely in situ on the R back in 2017     Lives in [place]. Enjoys walking  Was in the [employer]       Allergies:  Review of Patient's Allergies indicates:   Sulfa antibiotics       Hives, Rash      Physical examination:    Examination of the following areas of skin performed:  [x]  Scalp  [x]  Head/Neck  [x]  Chest  [x]  Abdomen  [x]  Back  [x]  Upper extremities  [x]  Lower extremities  [x]  Hands/Fingernails  [x]  Feet/Toenails  [x]  Groin/Buttocks  Notable exam findings:   Skin type II  Flaking over glabella and nasolabial folds   scattered brown macules, papules and pinkish brown papules  R back widened excision scar  Numerous tan to brown waxy/flaky stuck-on papules on the trunk      A/P  Seb derm, chronic. Mild exacerbation on the face  - Resume ketoconazole  cream daily to twice a day   - Offered to add Rx HC if flaring of itching    Seborrheic keratoses on trunk  - Reassurance      # Benign nevi, scattered on trunk and extremities: benign appearing brown macules and papules, most <81mm with no concerning features on exam or dermoscopy.   - Skin self-examination reviewed; ABCD's discussed.  - Sun protection and avoidance reviewed, including proper use of broad-spectrum UVB/UVA sunscreens with SPF 30 or greater, re-application after swimming and q 3-4 hours emphasized.?    # Dermatoheliosis of sun-exposed areas. Benign tan macules with moth-eaten borders c/w solar lentigines present.   -  Skin self-examination reviewed; ABCD's discussed.   ??- Sun protection and avoidance reviewed, including proper use of broad-spectrum UVB/UVA sunscreens with SPF 30 or greater, re-application after swimming and q 3-4 hours emphasized.    # History of melanoma (felt to be in situ), R back 2017.   - Well healed scar, NER.   - Skin self-examination reviewed; ABCD's discussed.   ??- Sun protection and avoidance reviewed, including proper use of broad-spectrum UVB/UVA sunscreens with SPF 30 or greater, re-application after swimming and q 3-4 hours emphasized.  - There is slight linear pigmentation, I do not think this is true recurrence but instead a lentigo growing into the scar. New photo taken.     Screening for skin cancers  - Recommend routine photoprotection with broad-spectrum sunscreen SPF 30 or higher     RTC: 36mo TBSE, then annually if no new concerning lesions   Clydia Cramp, MD    The patient verbally consented to an audio recording of their visit to assist with the completion of documentation. The patient is aware the recording is not retained after the visit is summarized.

## 2024-07-30 DIAGNOSIS — H353112 Nonexudative age-related macular degeneration, right eye, intermediate dry stage: Secondary | ICD-10-CM | POA: Diagnosis not present

## 2024-07-30 DIAGNOSIS — H43813 Vitreous degeneration, bilateral: Secondary | ICD-10-CM | POA: Diagnosis not present

## 2024-07-30 DIAGNOSIS — H35033 Hypertensive retinopathy, bilateral: Secondary | ICD-10-CM | POA: Diagnosis not present

## 2024-07-30 DIAGNOSIS — H353123 Nonexudative age-related macular degeneration, left eye, advanced atrophic without subfoveal involvement: Secondary | ICD-10-CM | POA: Diagnosis not present

## 2024-08-04 ENCOUNTER — Other Ambulatory Visit (HOSPITAL_BASED_OUTPATIENT_CLINIC_OR_DEPARTMENT_OTHER): Payer: Self-pay | Admitting: Family Medicine

## 2024-08-04 MED ORDER — ATORVASTATIN CALCIUM 40 MG PO TABS: 40.0000 mg | ORAL_TABLET | Freq: Every day | ORAL | 0 refills | Status: AC

## 2024-08-04 NOTE — Telephone Encounter (Signed)
 This prescription refill request has passed the Rx Renewal Authorization protocol.  This medication has been approved and sent to the patient's preferred pharmacy.      PER who is calling: Pharmacy, Otilio Groleau is a 83 year old male has requested a refill of atorvastatin .    ** Spoke to pharmacy - rx is out of refills. ** sending an additional refill      Last Office Visit: 10/25/23 with Johnathon SAUNDERS  Last Physical Exam: 10/10/21    There are no preventive care reminders to display for this patient.    Other Med Adult:  Most Recent BP Reading(s)  10/25/23 : 138/72        Cholesterol (mg/dL)   Date Value   94/72/7974 163     LOW DENSITY LIPOPROTEIN DIRECT (mg/dL)   Date Value   94/72/7974 84     HIGH DENSITY LIPOPROTEIN (mg/dL)   Date Value   94/72/7974 77     TRIGLYCERIDES (mg/dL)   Date Value   94/72/7974 82         No results found for: TSHSC      No results found for: TSH    HEMOGLOBIN A1C (%)   Date Value   10/25/2023 5.6       No results found for: POCA1C      No results found for: INR    SODIUM (mmol/L)   Date Value   10/25/2023 144       POTASSIUM (mmol/L)   Date Value   10/25/2023 4.2           CREATININE (mg/dL)   Date Value   97/72/7974 1.2       Documented patient preferred pharmacies:    [facility] - [place], [place] - [address]  Phone: [phone] Fax: [phone]

## 2024-08-06 DIAGNOSIS — Z23 Encounter for immunization: Secondary | ICD-10-CM | POA: Diagnosis not present

## 2024-08-19 ENCOUNTER — Other Ambulatory Visit: Payer: Self-pay | Admitting: Cardiovascular Disease

## 2024-08-19 MED ORDER — APIXABAN 5 MG PO TABS: 5.0000 mg | ORAL_TABLET | Freq: Two times a day (BID) | ORAL | 1 refills | Status: AC

## 2024-08-19 NOTE — Telephone Encounter (Signed)
 Pt last saw Fairy Heinrich, PA on 09/18/23, last labs 05/27/24 Creat 1.41, age 83, weight 94.3kg, based on specified criteria pt is on appropriate dosage of Eliquis  5mg  BID for afib.  Will refill rx.

## 2024-08-23 ENCOUNTER — Other Ambulatory Visit (HOSPITAL_BASED_OUTPATIENT_CLINIC_OR_DEPARTMENT_OTHER): Payer: Self-pay | Admitting: Cardiovascular Disease

## 2024-08-25 ENCOUNTER — Other Ambulatory Visit (HOSPITAL_BASED_OUTPATIENT_CLINIC_OR_DEPARTMENT_OTHER): Payer: Self-pay | Admitting: Cardiovascular Disease

## 2024-08-26 ENCOUNTER — Telehealth: Payer: Self-pay | Admitting: Cardiovascular Disease

## 2024-08-26 NOTE — Telephone Encounter (Signed)
. °*  STAT* If patient is at the pharmacy, call can be transferred to refill team.   1. Which medications need to be refilled? (please list name of each medication and dose if known) rosuvastatin  (CRESTOR ) 20 MG tablet    2. Would you like to learn more about the convenience, safety, & potential cost savings by using the [facility]? No   3. Are you open to using the [facility] No  4. Which pharmacy/location (including street and city if local pharmacy) is medication to be sent to?[id number] - [place], [place] - [address]    5. Do they need a 30 day or 90 day supply? 90 Day Supply  Pt is sch'd w/ Dr. Wonda on 10/02/24 at 10 AM w/ Dr. Wonda.

## 2024-08-27 ENCOUNTER — Other Ambulatory Visit (HOSPITAL_BASED_OUTPATIENT_CLINIC_OR_DEPARTMENT_OTHER): Payer: Self-pay | Admitting: Cardiovascular Disease

## 2024-08-27 MED ORDER — ROSUVASTATIN CALCIUM 20 MG PO TABS: 20.0000 mg | ORAL_TABLET | Freq: Every day | ORAL | 0 refills | Status: AC

## 2024-08-27 NOTE — Telephone Encounter (Signed)
 Requested Prescriptions   Signed Prescriptions Disp Refills   rosuvastatin  (CRESTOR ) 20 MG tablet 90 tablet 0    Sig: Take 1 tablet (20 mg total) by mouth daily.    Authorizing Provider: WONDA SHARPER    Ordering User: WILFRED, Geovanni Rahming  C

## 2024-09-24 ENCOUNTER — Other Ambulatory Visit (HOSPITAL_COMMUNITY): Payer: Self-pay | Admitting: Physician Assistant

## 2024-09-29 ENCOUNTER — Other Ambulatory Visit (HOSPITAL_COMMUNITY): Payer: Self-pay | Admitting: *Deleted

## 2024-09-29 MED ORDER — METOPROLOL TARTRATE 25 MG PO TABS: 25.0000 mg | ORAL_TABLET | Freq: Two times a day (BID) | ORAL | 0 refills | Status: AC

## 2024-10-02 ENCOUNTER — Encounter: Payer: Self-pay | Admitting: Cardiovascular Disease

## 2024-10-02 ENCOUNTER — Ambulatory Visit: Admitting: Cardiovascular Disease

## 2024-10-02 VITALS — BP 132/75 | HR 61 | Ht 72.0 in | Wt 214.8 lb

## 2024-10-02 DIAGNOSIS — I2510 Atherosclerotic heart disease of native coronary artery without angina pectoris: Secondary | ICD-10-CM | POA: Diagnosis not present

## 2024-10-02 DIAGNOSIS — I480 Paroxysmal atrial fibrillation: Secondary | ICD-10-CM

## 2024-10-02 DIAGNOSIS — E7849 Other hyperlipidemia: Secondary | ICD-10-CM | POA: Diagnosis not present

## 2024-10-02 DIAGNOSIS — I428 Other cardiomyopathies: Secondary | ICD-10-CM

## 2024-10-02 NOTE — Assessment & Plan Note (Addendum)
 Patient maintaining sinus rhythm with no symptoms and a normal EKG today.  He will continue on apixaban  and metoprolol .  No changes are made.  I will see him back in 1 year.

## 2024-10-02 NOTE — Progress Notes (Signed)
 " Cardiology Office Note:    Date:  10/02/2024   ID:  Kevin Day, DOB 06-25-1941, MRN [id number]  PCP:  Valentin Skates, DO   [facility] Providers Cardiologist:  Ozell Fell, MD Electrophysiologist:  Will Gladis Norton, MD     Referring MD: Valentin Skates, DO   Chief Complaint  Patient presents with   Atrial Fibrillation    History of Present Illness:    Kevin Day is a 84 y.o. male with a hx of atrial fibrillation status post ablation in 2023.  He was last seen in the atrial fibrillation clinic about 1 year ago.  He has had some episodic A-fib associated with steroid injections, but otherwise has done very well.  Comorbid conditions include mixed hyperlipidemia, aortic atherosclerosis, moderate nonobstructive CAD, dilated aortic root, heart failure with mildly reduced ejection fraction of 45 to 50%, and hypertension.  Last echo in 2023 showed LVEF improved up to 50 to 55% considered low normal function with grade 1 diastolic dysfunction, normal RV function, and aortic sclerosis without significant stenosis.  The ascending aorta measured 43 mm.  On his cardiac CT in 2023 for atrial fibrillation planning, his ascending aorta was only mildly dilated at 4.1 cm.  His coronary calcium  score was 881 on that study, placing him at the 67th percentile.   The patient is here alone today.  He is feeling fine.  He denies any chest pain, chest pressure, or shortness of breath.  He has no complaints today.  Current Medications: Active Medications[1]   Allergies:   Patient has no known allergies.   ROS:   Please see the history of present illness.    All other systems reviewed and are negative.  EKGs/Labs/Other Studies Reviewed:    The following studies were reviewed today: Cardiac Studies & Procedures   ______________________________________________________________________________________________   STRESS TESTS  MYOCARDIAL PERFUSION IMAGING 03/20/2019  Interpretation  Summary  Nuclear stress EF: 50%.  No T wave inversion was noted during stress.  There was no ST segment deviation noted during stress.  This is a low risk study.  No reversible ischemia. LVEF 50% with normal wall motion. This is a low risk study.   ECHOCARDIOGRAM  ECHOCARDIOGRAM COMPLETE 12/06/2021  Narrative ECHOCARDIOGRAM REPORT    Patient Name:   Kevin Day Date of Exam: 12/06/2021 Medical Rec #:  [id number]     Height:       72.0 in Accession #:    [id number]    Weight:       211.0 lb Date of Birth:  1940/12/11     BSA:          2.180 m Patient Age:    80 years      BP:           116/72 mmHg Patient Gender: M             HR:           55 bpm. Exam Location:  [address]  Procedure: 2D Echo, Cardiac Doppler and Color Doppler  Indications:    I48.0 Paroxysmal atrial fibrillation  History:        Patient has prior history of Echocardiogram examinations, most recent 05/23/2021. CAD, Arrythmias:Atrial Fibrillation; Risk Factors:Hypertension, Dyslipidemia, Former Smoker and Dilated aortic root.  Sonographer:    Elsie Bohr RDCS Referring Phys: [phone] Wilkie Zenon  IMPRESSIONS   1. Left ventricular ejection fraction, by estimation, is 50 to 55%. The left ventricle has low normal function. The  left ventricle has no regional wall motion abnormalities. Left ventricular diastolic parameters are consistent with Grade I diastolic dysfunction (impaired relaxation). 2. Right ventricular systolic function is normal. The right ventricular size is normal. 3. The mitral valve is normal in structure. Mild mitral valve regurgitation. 4. The aortic valve is tricuspid. There is mild calcification of the aortic valve. There is moderate thickening of the aortic valve. Aortic valve regurgitation is mild. Aortic valve sclerosis/calcification is present, without any evidence of aortic stenosis. 5. Aortic dilatation noted. There is mild dilatation of the aortic root, measuring 40 mm.  There is mild dilatation of the ascending aorta, measuring 43 mm. 6. The inferior vena cava is normal in size with greater than 50% respiratory variability, suggesting right atrial pressure of 3 mmHg.  Comparison(s): Compared to prior TTE in 04/2021, the LVEF appears mildly improved at 50-55% (previously 45-50%). There is moderate aortic sclerosis on current study without stenosis (suspect typo on prior study and it was meant to say aortic sclerosis instead of stenosis as did not appear to have AS at that time).  FINDINGS Left Ventricle: Left ventricular ejection fraction, by estimation, is 50 to 55%. The left ventricle has low normal function. The left ventricle has no regional wall motion abnormalities. The left ventricular internal cavity size was normal in size. There is no left ventricular hypertrophy. Left ventricular diastolic parameters are consistent with Grade I diastolic dysfunction (impaired relaxation).  Right Ventricle: The right ventricular size is normal. No increase in right ventricular wall thickness. Right ventricular systolic function is normal.  Left Atrium: Left atrial size was normal in size.  Right Atrium: Right atrial size was normal in size.  Pericardium: There is no evidence of pericardial effusion.  Mitral Valve: The mitral valve is normal in structure. There is mild thickening of the mitral valve leaflet(s). There is mild calcification of the mitral valve leaflet(s). Mild mitral annular calcification. Mild mitral valve regurgitation.  Tricuspid Valve: The tricuspid valve is normal in structure. Tricuspid valve regurgitation is trivial.  Aortic Valve: The aortic valve is tricuspid. There is mild calcification of the aortic valve. There is moderate thickening of the aortic valve. Aortic valve regurgitation is mild. Aortic regurgitation PHT measures 721 msec. Aortic valve sclerosis/calcification is present, without any evidence of aortic stenosis.  Pulmonic Valve:  The pulmonic valve was not well visualized. Pulmonic valve regurgitation is trivial.  Aorta: Aortic dilatation noted. There is mild dilatation of the aortic root, measuring 40 mm. There is mild dilatation of the ascending aorta, measuring 43 mm.  Venous: The inferior vena cava is normal in size with greater than 50% respiratory variability, suggesting right atrial pressure of 3 mmHg.  IAS/Shunts: The atrial septum is grossly normal.   LEFT VENTRICLE PLAX 2D LVIDd:         5.40 cm   Diastology LVIDs:         3.50 cm   LV e' medial:    7.72 cm/s LV PW:         1.10 cm   LV E/e' medial:  12.0 LV IVS:        1.10 cm   LV e' lateral:   9.14 cm/s LVOT diam:     2.30 cm   LV E/e' lateral: 10.1 LV SV:         91 LV SV Index:   42 LVOT Area:     4.15 cm   RIGHT VENTRICLE  IVC RV S prime:     12.50 cm/s  IVC diam: 1.00 cm TAPSE (M-mode): 1.9 cm RVSP:           21.8 mmHg  LEFT ATRIUM             Index        RIGHT ATRIUM           Index LA diam:        4.00 cm 1.84 cm/m   Kevin Pressure: 3.00 mmHg LA Vol (A2C):   64.3 ml 29.50 ml/m  Kevin Area:     14.40 cm LA Vol (A4C):   51.9 ml 23.81 ml/m  Kevin Volume:   36.50 ml  16.75 ml/m LA Biplane Vol: 56.6 ml 25.97 ml/m AORTIC VALVE LVOT Vmax:   94.20 cm/s LVOT Vmean:  63.800 cm/s LVOT VTI:    0.220 m AI PHT:      721 msec  AORTA Ao Root diam: 4.00 cm Ao Asc diam:  4.30 cm  MITRAL VALVE                TRICUSPID VALVE MV Area (PHT): 2.81 cm     TR Peak grad:   18.8 mmHg MV Decel Time: 270 msec     TR Vmax:        217.00 cm/s MV E velocity: 92.32 cm/s   Estimated RAP:  3.00 mmHg MV A velocity: 116.00 cm/s  RVSP:           21.8 mmHg MV E/A ratio:  0.80 SHUNTS Systemic VTI:  0.22 m Systemic Diam: 2.30 cm  Powell Sorrow MD Electronically signed by Powell Sorrow MD Signature Date/Time: 12/06/2021/11:45:05 AM    Final      CT SCANS  CT CORONARY FRACTIONAL FLOW RESERVE DATA PREP 05/23/2021  Narrative : CT FFR  ANALYSIS  FINDINGS: CT FFR analysis was performed on the original cardiac computed tomography angiogram dataset. Diagrammatic representation of the CT FFR analysis is provided in a separate PDF document in PACS. This dictation was created using the PDF document and an interactive 3D model of the results. The 3D model is not available in the EMR/PACS. Normal CT FFR range is >0.80.  1. Left Main: No significant stenosis. 0.96 to 0.88 mid post lesion. Diagonal is too small to quantify. Distal LAD 0.76 small caliber vessel.  2. LAD: No significant stenosis. 3. LCX: No significant stenosis. 4. RCA: No significant stenosis.  IMPRESSION: 1.  CT FFR analysis didn't show any significant stenosis.  (LAD).  2. Recommend continued medical management - if symptoms worsen or become more worrisome, consider cardiac catheterization.  Note: These examples are not recommendations of [employer] and only provided as examples of what other customers are doing.   Electronically Signed By: Oneil Parchment M.D. On: 05/23/2021 13:59   CT SCANS  CT CORONARY MORPH W/CTA COR W/SCORE 05/20/2021  Addendum 05/20/2021 12:35 PM ADDENDUM REPORT: 05/20/2021 12:31  CLINICAL DATA:  84 year old with chest pain.  EXAM: Cardiac/Coronary  CTA  TECHNIQUE: The patient was scanned on a [employer].  FINDINGS: A 100 kV prospective scan was triggered in the descending thoracic aorta at 111 HU's. Axial non-contrast 3 mm slices were carried out through the heart. The data set was analyzed on a dedicated work station and scored using the Agatson method. Gantry rotation speed was 250 msecs and collimation was .6 mm. 0.8 mg of sl NTG was given. The 3D data set was reconstructed in 5% intervals  of the 67-82 % of the R-R cycle. Diastolic phases were analyzed on a dedicated work station using MPR, MIP and VRT modes. The patient received 80 cc of contrast.  Aorta: Normal size. (39 mm ascending). Aortic  atherosclerosis. No dissection.  Aortic Valve: Mild calcifications.  Coronary Arteries:  Normal coronary origin.  Right dominance.  RCA is a large dominant artery that gives rise to PDA and PLA. There is calcified proximal plaque, 0-24%.  Left main is a large artery that gives rise to LAD and LCX arteries. There is ostial calcified plaque 0-24% stenosis.  LAD is a large vessel that has diffuse calcified plaque, stenosis of 50-74% post small first diagonal branch  LCX is a non-dominant artery that gives rise to one large OM1 branch. There is proximal calcified scattered plaque, 0-24% stenosis. There is mid circumflex 25-49% stenosis.  Other findings:  Normal pulmonary vein drainage into the left atrium.  Normal left atrial appendage without a thrombus.  Normal size of the pulmonary artery.  Please see radiology report for non cardiac findings.  IMPRESSION: 1. Coronary calcium  score of 972 (LM 101, LAD 712, LCX 82, RCA 78). This was 69 percentile for age and sex matched control.  2. Normal coronary origin with right dominance.  3. Multivessel CAD with moderate to severe mid LAD stenosis, moderate mid circumflex. Sending for FFR.  4.  Aortic atherosclerosis.   Electronically Signed By: Oneil Parchment M.D. On: 05/20/2021 12:31  Narrative EXAM: OVER-READ INTERPRETATION  CT CHEST  The following report is an over-read performed by radiologist Dr. Toribio Aye of [facility] on 05/20/2021. This over-read does not include interpretation of cardiac or coronary anatomy or pathology. The coronary calcium  score/coronary CTA interpretation by the cardiologist is attached.  COMPARISON:  None.  FINDINGS: Atherosclerotic calcifications in the thoracic aorta. Within the visualized portions of the thorax there are no suspicious appearing pulmonary nodules or masses, there is no acute consolidative airspace disease, no pleural effusions, no pneumothorax and  no lymphadenopathy. Visualized portions of the upper abdomen are unremarkable. There are no aggressive appearing lytic or blastic lesions noted in the visualized portions of the skeleton.  IMPRESSION: 1.  Aortic Atherosclerosis (ICD10-I70.0).  Electronically Signed: By: Toribio Aye M.D. On: 05/20/2021 09:19     ______________________________________________________________________________________________      EKG:   EKG Interpretation Date/Time:  Thursday October 02 2024 10:23:43 EST Ventricular Rate:  61 PR Interval:  190 QRS Duration:  94 QT Interval:  418 QTC Calculation: 420 R Axis:   -40  Text Interpretation: Normal sinus rhythm Left axis deviation Nonspecific ST abnormality When compared with ECG of 18-Sep-2023 13:48, Premature atrial complexes are no longer Present Vent. rate has decreased BY  32 BPM Confirmed by Wonda Sharper [phone]) on 10/02/2024 10:38:39 AM    Recent Labs: 05/27/2024: BUN 18; Creatinine, Ser 1.41; Hemoglobin 14.7; Platelets 161; Potassium 4.4; Sodium 137  Recent Lipid Panel    Component Value Date/Time   CHOL 139 08/08/2023 0809   CHOL 142 05/30/2019 0750   TRIG 104 08/08/2023 0809   HDL 46 08/08/2023 0809   HDL 39 (L) 05/30/2019 0750   CHOLHDL 3.0 08/08/2023 0809   VLDL 21 08/08/2023 0809   LDLCALC 72 08/08/2023 0809   LDLCALC 77 05/30/2019 0750     Risk Assessment/Calculations:    CHA2DS2-VASc Score = 4   This indicates a 4.8% annual risk of stroke. The patient's score is based upon: CHF History: 0 HTN History: 1 Diabetes History: 0  Stroke History: 0 Vascular Disease History: 1 Age Score: 2 Gender Score: 0               Physical Exam:    VS:  BP 132/75 (BP Location: Left Arm)   Pulse 61   Ht 6' (1.829 m)   Wt 214 lb 12.8 oz (97.4 kg)   SpO2 96%   BMI 29.13 kg/m     Wt Readings from Last 3 Encounters:  10/02/24 214 lb 12.8 oz (97.4 kg)  05/26/24 208 lb (94.3 kg)  05/15/24 208 lb (94.3 kg)     GEN:  Well  nourished, well developed in no acute distress HEENT: Normal NECK: No JVD; No carotid bruits LYMPHATICS: No lymphadenopathy CARDIAC: RRR, no murmurs, rubs, gallops RESPIRATORY:  Clear to auscultation without rales, wheezing or rhonchi  ABDOMEN: Soft, non-tender, non-distended MUSCULOSKELETAL:  No edema; No deformity  SKIN: Warm and dry NEUROLOGIC:  Alert and oriented x 3 PSYCHIATRIC:  Normal affect   Assessment & Plan Paroxysmal atrial fibrillation [facility]) Patient maintaining sinus rhythm with no symptoms and a normal EKG today.  He will continue on apixaban  and metoprolol .  No changes are made.  I will see him back in 1 year. Nonischemic cardiomyopathy (HCC) LVEF has improved on medical therapy with Entresto  and metoprolol .  The patient is having no symptoms of heart failure.  LVEF 50 to 55%.  Continue current regimen.  Most recent labs reviewed today including review of his kidney function and electrolytes from May 27, 2024 with a creatinine of 1.41 and potassium of 4.4. Other hyperlipidemia Last lipids reviewed.  Continue rosuvastatin  20 mg daily. Coronary artery disease involving native coronary artery of native heart without angina pectoris Patient clinically stable with no angina, noted to have mild to moderate nonobstructive disease by coronary CT study in the past.  Treated with a beta-blocker, apixaban , and statin drug.            Medication Adjustments/Labs and Tests Ordered: Current medicines are reviewed at length with the patient today.  Concerns regarding medicines are outlined above.  Orders Placed This Encounter  Procedures   EKG 12-Lead   No orders of the defined types were placed in this encounter.   Patient Instructions  Medication Instructions:  No medication changes were made at this visit. Continue current regimen.   *If you need a refill on your cardiac medications before your next appointment, please call your pharmacy*  Lab Work: None ordered  today. If you have labs (blood work) drawn today and your tests are completely normal, you will receive your results only by: MyChart Message (if you have MyChart) OR A paper copy in the mail If you have any lab test that is abnormal or we need to change your treatment, we will call you to review the results.  Testing/Procedures: None ordered today.  Follow-Up: At [facility], you and your health needs are our priority.  As part of our continuing mission to provide you with exceptional heart care, our providers are all part of one team.  This team includes your primary Cardiologist (physician) and Advanced Practice Providers or APPs (Physician Assistants and Nurse Practitioners) who all work together to provide you with the care you need, when you need it.  Your next appointment:   1 year(s)  Provider:   Ozell Fell, MD     Signed, Ozell Fell, MD  10/02/2024 1:39 PM    [facility]     [1]  Current Meds  Medication Sig   apixaban  (ELIQUIS ) 5 MG TABS tablet Take 1 tablet (5 mg total) by mouth 2 (two) times daily.   Glucosamine HCl (GLUCOSAMINE PO) Take 2 tablets by mouth daily.   methocarbamol  (ROBAXIN ) 500 MG tablet Take 1 tablet (500 mg total) by mouth every 6 (six) hours as needed for muscle spasms.   metoprolol  tartrate (LOPRESSOR ) 25 MG tablet Take 1 tablet (25 mg total) by mouth 2 (two) times daily.   Multiple Vitamins-Minerals (OCUVITE EXTRA) TABS Take 1 tablet by mouth daily.   pantoprazole  (PROTONIX ) 20 MG tablet daily as needed for heartburn or indigestion.   potassium chloride  (KLOR-CON ) 10 MEQ tablet Take 2 tablets (20 mEq total) by mouth daily.   rosuvastatin  (CRESTOR ) 20 MG tablet Take 1 tablet (20 mg total) by mouth daily.   sacubitril -valsartan  (ENTRESTO ) 24-26 MG TAKE 1 TABLET BY MOUTH TWICE DAILY   "

## 2024-10-02 NOTE — Patient Instructions (Signed)

## 2025-01-14 ENCOUNTER — Ambulatory Visit: Admitting: Dermatology
